# Patient Record
Sex: Female | Born: 1950
Health system: Southern US, Community
[De-identification: ages and names within clinical notes are randomized; demographics above are authoritative.]

## PROBLEM LIST (undated history)

## (undated) DIAGNOSIS — R519 Headache, unspecified: Secondary | ICD-10-CM

## (undated) DIAGNOSIS — M1990 Unspecified osteoarthritis, unspecified site: Secondary | ICD-10-CM

## (undated) DIAGNOSIS — C541 Malignant neoplasm of endometrium: Secondary | ICD-10-CM

## (undated) DIAGNOSIS — R7303 Prediabetes: Secondary | ICD-10-CM

## (undated) DIAGNOSIS — F32A Depression, unspecified: Secondary | ICD-10-CM

## (undated) DIAGNOSIS — K7581 Nonalcoholic steatohepatitis (NASH): Secondary | ICD-10-CM

## (undated) DIAGNOSIS — E669 Obesity, unspecified: Secondary | ICD-10-CM

## (undated) DIAGNOSIS — K219 Gastro-esophageal reflux disease without esophagitis: Secondary | ICD-10-CM

## (undated) DIAGNOSIS — R739 Hyperglycemia, unspecified: Secondary | ICD-10-CM

## (undated) DIAGNOSIS — F329 Major depressive disorder, single episode, unspecified: Secondary | ICD-10-CM

## (undated) DIAGNOSIS — I4891 Unspecified atrial fibrillation: Secondary | ICD-10-CM

## (undated) DIAGNOSIS — R32 Unspecified urinary incontinence: Secondary | ICD-10-CM

## (undated) DIAGNOSIS — D126 Benign neoplasm of colon, unspecified: Secondary | ICD-10-CM

## (undated) DIAGNOSIS — E785 Hyperlipidemia, unspecified: Secondary | ICD-10-CM

## (undated) DIAGNOSIS — K760 Fatty (change of) liver, not elsewhere classified: Secondary | ICD-10-CM

## (undated) DIAGNOSIS — I10 Essential (primary) hypertension: Secondary | ICD-10-CM

## (undated) HISTORY — DX: Obesity, unspecified: E66.9

## (undated) HISTORY — DX: Unspecified urinary incontinence: R32

## (undated) HISTORY — DX: Hyperlipidemia, unspecified: E78.5

## (undated) HISTORY — DX: Benign neoplasm of colon, unspecified: D12.6

## (undated) HISTORY — DX: Essential (primary) hypertension: I10

## (undated) HISTORY — PX: WISDOM TOOTH EXTRACTION: SHX21

## (undated) HISTORY — DX: Nonalcoholic steatohepatitis (NASH): K75.81

## (undated) HISTORY — DX: Fatty (change of) liver, not elsewhere classified: K76.0

## (undated) HISTORY — DX: Depression, unspecified: F32.A

## (undated) HISTORY — DX: Hyperglycemia, unspecified: R73.9

## (undated) HISTORY — PX: TUBAL LIGATION: SHX77

## (undated) HISTORY — DX: Gastro-esophageal reflux disease without esophagitis: K21.9

## (undated) HISTORY — DX: Malignant neoplasm of endometrium: C54.1

## (undated) HISTORY — DX: Unspecified osteoarthritis, unspecified site: M19.90

## (undated) HISTORY — DX: Major depressive disorder, single episode, unspecified: F32.9

## (undated) HISTORY — DX: Unspecified atrial fibrillation: I48.91

---

## 2001-05-15 ENCOUNTER — Encounter: Payer: Self-pay | Admitting: Orthopaedic Surgery

## 2001-05-15 ENCOUNTER — Ambulatory Visit (HOSPITAL_COMMUNITY): Admission: RE | Admit: 2001-05-15 | Discharge: 2001-05-15 | Payer: Self-pay | Admitting: Orthopaedic Surgery

## 2001-07-06 DIAGNOSIS — I739 Peripheral vascular disease, unspecified: Secondary | ICD-10-CM

## 2001-07-06 HISTORY — DX: Peripheral vascular disease, unspecified: I73.9

## 2001-11-14 ENCOUNTER — Ambulatory Visit (HOSPITAL_COMMUNITY): Admission: RE | Admit: 2001-11-14 | Discharge: 2001-11-14 | Payer: Self-pay | Admitting: Internal Medicine

## 2004-06-05 ENCOUNTER — Ambulatory Visit: Payer: Self-pay | Admitting: Internal Medicine

## 2004-07-06 DIAGNOSIS — D126 Benign neoplasm of colon, unspecified: Secondary | ICD-10-CM

## 2004-07-06 HISTORY — PX: ESOPHAGOGASTRODUODENOSCOPY: SHX1529

## 2004-07-06 HISTORY — DX: Benign neoplasm of colon, unspecified: D12.6

## 2005-03-19 ENCOUNTER — Ambulatory Visit: Payer: Self-pay | Admitting: Urgent Care

## 2005-04-09 ENCOUNTER — Ambulatory Visit: Payer: Self-pay | Admitting: Internal Medicine

## 2005-04-09 ENCOUNTER — Ambulatory Visit (HOSPITAL_COMMUNITY): Admission: RE | Admit: 2005-04-09 | Discharge: 2005-04-09 | Payer: Self-pay | Admitting: Internal Medicine

## 2005-04-09 ENCOUNTER — Encounter (INDEPENDENT_AMBULATORY_CARE_PROVIDER_SITE_OTHER): Payer: Self-pay | Admitting: Internal Medicine

## 2005-04-09 HISTORY — PX: COLONOSCOPY: SHX174

## 2005-11-03 ENCOUNTER — Ambulatory Visit (HOSPITAL_COMMUNITY): Payer: Self-pay | Admitting: Psychology

## 2005-11-23 ENCOUNTER — Ambulatory Visit (HOSPITAL_COMMUNITY): Payer: Self-pay | Admitting: Psychology

## 2005-12-28 ENCOUNTER — Ambulatory Visit (HOSPITAL_COMMUNITY): Payer: Self-pay | Admitting: Psychology

## 2006-01-10 ENCOUNTER — Ambulatory Visit: Admission: RE | Admit: 2006-01-10 | Discharge: 2006-01-10 | Payer: Self-pay | Admitting: Internal Medicine

## 2006-12-03 ENCOUNTER — Encounter
Admission: RE | Admit: 2006-12-03 | Discharge: 2006-12-03 | Payer: Self-pay | Admitting: Physical Medicine and Rehabilitation

## 2006-12-14 ENCOUNTER — Ambulatory Visit: Payer: Self-pay | Admitting: Internal Medicine

## 2007-03-17 ENCOUNTER — Ambulatory Visit (HOSPITAL_COMMUNITY): Admission: RE | Admit: 2007-03-17 | Discharge: 2007-03-17 | Payer: Self-pay | Admitting: Internal Medicine

## 2007-04-06 ENCOUNTER — Ambulatory Visit: Payer: Self-pay | Admitting: Gastroenterology

## 2007-07-20 ENCOUNTER — Ambulatory Visit (HOSPITAL_COMMUNITY): Admission: RE | Admit: 2007-07-20 | Discharge: 2007-07-20 | Payer: Self-pay | Admitting: Internal Medicine

## 2007-08-04 ENCOUNTER — Ambulatory Visit: Payer: Self-pay | Admitting: Gastroenterology

## 2008-02-28 DIAGNOSIS — Z86010 Personal history of colonic polyps: Secondary | ICD-10-CM | POA: Insufficient documentation

## 2008-02-28 DIAGNOSIS — K760 Fatty (change of) liver, not elsewhere classified: Secondary | ICD-10-CM | POA: Insufficient documentation

## 2008-02-28 DIAGNOSIS — R1110 Vomiting, unspecified: Secondary | ICD-10-CM | POA: Insufficient documentation

## 2008-02-28 DIAGNOSIS — M549 Dorsalgia, unspecified: Secondary | ICD-10-CM | POA: Insufficient documentation

## 2008-02-28 DIAGNOSIS — K219 Gastro-esophageal reflux disease without esophagitis: Secondary | ICD-10-CM | POA: Insufficient documentation

## 2008-02-28 DIAGNOSIS — K644 Residual hemorrhoidal skin tags: Secondary | ICD-10-CM | POA: Insufficient documentation

## 2008-02-28 DIAGNOSIS — Z87898 Personal history of other specified conditions: Secondary | ICD-10-CM | POA: Insufficient documentation

## 2008-02-28 DIAGNOSIS — R109 Unspecified abdominal pain: Secondary | ICD-10-CM | POA: Insufficient documentation

## 2008-02-28 DIAGNOSIS — R609 Edema, unspecified: Secondary | ICD-10-CM | POA: Insufficient documentation

## 2008-02-28 DIAGNOSIS — Z87891 Personal history of nicotine dependence: Secondary | ICD-10-CM | POA: Insufficient documentation

## 2008-02-28 DIAGNOSIS — R142 Eructation: Secondary | ICD-10-CM

## 2008-02-28 DIAGNOSIS — I10 Essential (primary) hypertension: Secondary | ICD-10-CM | POA: Insufficient documentation

## 2008-02-28 DIAGNOSIS — R143 Flatulence: Secondary | ICD-10-CM

## 2008-02-28 DIAGNOSIS — D131 Benign neoplasm of stomach: Secondary | ICD-10-CM | POA: Insufficient documentation

## 2008-02-28 DIAGNOSIS — R141 Gas pain: Secondary | ICD-10-CM | POA: Insufficient documentation

## 2008-02-28 DIAGNOSIS — F341 Dysthymic disorder: Secondary | ICD-10-CM | POA: Insufficient documentation

## 2008-02-28 DIAGNOSIS — E785 Hyperlipidemia, unspecified: Secondary | ICD-10-CM | POA: Insufficient documentation

## 2008-02-28 DIAGNOSIS — Z8719 Personal history of other diseases of the digestive system: Secondary | ICD-10-CM | POA: Insufficient documentation

## 2008-02-28 DIAGNOSIS — K589 Irritable bowel syndrome without diarrhea: Secondary | ICD-10-CM | POA: Insufficient documentation

## 2008-07-18 ENCOUNTER — Ambulatory Visit: Payer: Self-pay | Admitting: Gastroenterology

## 2008-12-28 ENCOUNTER — Encounter: Payer: Self-pay | Admitting: Gastroenterology

## 2009-08-23 ENCOUNTER — Encounter: Payer: Self-pay | Admitting: Urgent Care

## 2009-10-23 ENCOUNTER — Encounter: Payer: Self-pay | Admitting: Gastroenterology

## 2010-04-23 ENCOUNTER — Encounter (INDEPENDENT_AMBULATORY_CARE_PROVIDER_SITE_OTHER): Payer: Self-pay | Admitting: *Deleted

## 2010-07-15 ENCOUNTER — Encounter (INDEPENDENT_AMBULATORY_CARE_PROVIDER_SITE_OTHER): Payer: Self-pay | Admitting: *Deleted

## 2010-07-27 ENCOUNTER — Encounter: Payer: Self-pay | Admitting: Internal Medicine

## 2010-08-05 NOTE — Medication Information (Signed)
Summary: Visual merchandiser   Imported By: Zeb Comfort 10/23/2009 13:59:53  _____________________________________________________________________  External Attachment:    Type:   Image     Comment:   External Document  Appended Document: FP Folder Pt had Rx for Protonix once daily authorized APR 2011. No visit since 2010. Will need to be seen to change Rx: 30 min slot.  Appended Document: RX Folder Pt informed and Lennette Bihari @ [facility] informed.  Appended Document: RX Folder I spoke with the pt and she called [facility] and they had the old Rx on file..After looking further they were able to find the most current Rx changed to once daily with 11 refills..She doesn't need ov and was able to use her refills for Protonix.

## 2010-08-05 NOTE — Letter (Signed)
Summary: Recall, Screening Colonoscopy Only  [facility]  [address]   [place], [place] [address]   Phone: [phone]  Fax: [phone]    April 23, 2010  Patty Estrada [address] [place], [place]  [address] 06-Aug-1950   Dear Patty Estrada,   [place] records indicate it is time to schedule your colonoscopy.    Please call our office at [phone] and ask for the nurse.   Thank you,    Burnadette Peter, LPN Waldon Merl, [facility] Ph: [phone]   Fax: [phone]

## 2010-08-05 NOTE — Medication Information (Signed)
Summary: Visual merchandiser   Imported By: Zeb Comfort 08/23/2009 11:56:08  _____________________________________________________________________  External Attachment:    Type:   Image     Comment:   External Document  Appended Document: RX Folderpantoprazole    Prescriptions: PROTONIX 40 MG TBEC (PANTOPRAZOLE SODIUM) one by mouth daily  #31 x 11   Entered and Authorized by:   Andria Meuse FNP-BC   Signed by:   Andria Meuse FNP-BC on 08/26/2009   Method used:   Electronically to        [facility] (retail)       [address], [place]  [address]       Ph: [phone]       Fax: [phone]   RxID:   [id number]

## 2010-08-07 NOTE — Letter (Signed)
Summary: Recall Office Visit  [facility]  [address]   [place], [place] [address]   Phone: [phone]  Fax: [phone]      July 15, 2010   MONEE DEMBECK [address] [place], [place]  [address] [birth year]   Dear Ms. Hanneman,   According to our records, it is time for you to schedule a follow-up office visit with [place].   At your convenience, please call [phone] to schedule an office visit. If you have any questions, concerns, or feel that this letter is in error, we would appreciate your call.   Sincerely,    [facility] Ph: [phone]   Fax: [phone]

## 2010-09-29 ENCOUNTER — Encounter: Payer: Self-pay | Admitting: Gastroenterology

## 2010-09-30 ENCOUNTER — Encounter: Payer: Self-pay | Admitting: Gastroenterology

## 2010-09-30 ENCOUNTER — Ambulatory Visit (INDEPENDENT_AMBULATORY_CARE_PROVIDER_SITE_OTHER): Payer: Self-pay | Admitting: Gastroenterology

## 2010-09-30 VITALS — BP 120/68 | HR 80 | Temp 98.3°F | Ht 66.0 in | Wt 350.5 lb

## 2010-09-30 DIAGNOSIS — Z860100 Personal history of colon polyps, unspecified: Secondary | ICD-10-CM

## 2010-09-30 DIAGNOSIS — R1012 Left upper quadrant pain: Secondary | ICD-10-CM

## 2010-09-30 DIAGNOSIS — K219 Gastro-esophageal reflux disease without esophagitis: Secondary | ICD-10-CM

## 2010-09-30 DIAGNOSIS — K7689 Other specified diseases of liver: Secondary | ICD-10-CM

## 2010-09-30 DIAGNOSIS — K589 Irritable bowel syndrome without diarrhea: Secondary | ICD-10-CM

## 2010-09-30 MED ORDER — ALIGN 4 MG PO CAPS: 4.0000 mg | ORAL_CAPSULE | Freq: Every day | ORAL | Status: DC

## 2010-09-30 MED ORDER — PANTOPRAZOLE SODIUM 40 MG PO TBEC: 40.0000 mg | DELAYED_RELEASE_TABLET | Freq: Two times a day (BID) | ORAL | Status: DC

## 2010-09-30 NOTE — Assessment & Plan Note (Signed)
Back in 2008 she had an abdominal ultrasound which questioned possibility of cirrhosis given somewhat nodular contour the liver. She definitely had a fatty liver. Her LFTs are normal. She's had no indication of poor hepatic function. She may need further imaging for further evaluation of her liver. Will discuss with Dr. Gala Romney , and Dr. Oneida Alar absence.

## 2010-09-30 NOTE — Assessment & Plan Note (Signed)
Due for surveillance colonoscopy given history of adenomatous colonic polyps. Colonoscopy to be done in May 2012 per patient request. I have discussed the risks, alternatives, benefits with regards to but not limited to the risk of reaction to medication, bleeding, infection, perforation and the patient is agreeable to proceed.

## 2010-09-30 NOTE — Progress Notes (Signed)
Primary Care Physician:  Asencion Noble, MD  Chief Complaint  Patient presents with  . Medication Refill    protonix generic   . Pain    LUQ  . Colonoscopy    HPI:  Patty Estrada is a 60 y.o. female  Who is here for abdominal pain, GERD, to schedule colonoscopy. It has been 2 years since we last saw her. She was due for a surveillance colonoscopy in 2011 given history of adenomatous polyps. She made the appointment today primarily because of abdominal pain. Over the weekend she states her GI tract as did not feel right. She notes that her daughters have been in town and she was not eating as well as she had been. She's been eating out more frequently. She decided to take some Metamucil to see if that would help. She woke up early Sunday morning with left upper quadrant abdominal pain. Eventually she did have a bowel movement which she describes as normal. Yesterday she was still quite sore the left upper quadrant region. Today she's feeling better. She denies any fever or chills. No melena or rectal bleeding. She has noticed some increased reflux symptoms as well as belching. She's had increased nausea. She thinks this is because she is no longer taking her protonix twice a day. She notes that she stopped taking her protonix twice a day when she found her vitamin D level was low. Denies any dysphagia. No vomiting.   She brought in the lab work that she had done for Dr. Willey Blade back in February. She had normal LFTs , her creatinine was normal at 0.7.  Past Medical History  Diagnosis Date  . HTN (hypertension)   . Obesity   . GERD (gastroesophageal reflux disease)   . Hyperlipidemia   . Arthritis   . Adenomatous colon polyp 2006       . Depression   . Sleep apnea   . Fatty liver     ?cirrhosis on u/s in 2008    Past Surgical History  Procedure Date  . Tubal ligation   . Colonoscopy 04/09/2005    adenomatous polyps, two diverticula  . Esophagogastroduodenoscopy 2006    hyperplastic  polyps, hh    Current Outpatient Prescriptions  Medication Sig Dispense Refill  . acetaminophen (TYLENOL) 325 MG tablet Take 650 mg by mouth as needed.        Marland Kitchen atenolol (TENORMIN) 50 MG tablet Take 50 mg by mouth daily.        Marland Kitchen atorvastatin (LIPITOR) 40 MG tablet Take 40 mg by mouth daily.        . benazepril (LOTENSIN) 40 MG tablet Take 40 mg by mouth daily.        . CELEBREX 100 MG capsule Take 1 capsule by mouth daily as needed. May take up to two      . celecoxib (CELEBREX) 200 MG capsule Take 200 mg by mouth as needed.       . Cholecalciferol (VITAMIN D3) 1000 UNITS tablet Take 1,000 Units by mouth daily.        . ergocalciferol (VITAMIN D2) 50000 UNITS capsule Take 50,000 Units by mouth once a week.        . furosemide (LASIX) 40 MG tablet Take 40 mg by mouth 2 (two) times daily.        . furosemide (LASIX) 80 MG tablet Take 1 tablet by mouth daily.       Marland Kitchen ibuprofen (ADVIL) 200 MG tablet Take 200 mg by mouth as  needed.        . pantoprazole (PROTONIX) 40 MG tablet Take 1 tablet (40 mg total) by mouth 2 (two) times daily.  60 tablet  5  . potassium chloride SA (K-DUR,KLOR-CON) 20 MEQ tablet Take 1 tablet by mouth daily.      . sertraline (ZOLOFT) 50 MG tablet Take 50 mg by mouth daily.        Marland Kitchen DISCONTD: pantoprazole (PROTONIX) 40 MG tablet Take 40 mg by mouth daily.        . Cholecalciferol (VITAMIN D) 1000 UNITS capsule Take 1,000 Units by mouth every 14 (fourteen) days.       . Probiotic Product (ALIGN) 4 MG CAPS Take 4 mg by mouth daily.  30 capsule  0  . DISCONTD: Cream Base (PCCA LIPODERM BASE) CREA Apply topically. AS NEEDED       . DISCONTD: pregabalin (LYRICA) 75 MG capsule Take 75 mg by mouth 2 (two) times daily. AS NEEDED         Allergies as of 09/30/2010 - Review Complete 09/30/2010  Allergen Reaction Noted  . Gabapentin    . Nitrofurantoin      Family History  Problem Relation Age of Onset  . Breast cancer Mother 53  . Heart attack Father 71  . Colon cancer  Neg Hx   . Liver disease Neg Hx     History   Social History  . Marital Status: Married    Spouse Name: N/A    Number of Children: 2  . Years of Education: N/A   Occupational History  . Not on file.   Social History Main Topics  . Smoking status: Former Smoker -- 2.0 packs/day for 13 years    Types: Cigarettes    Quit date: 07/06/1984  . Smokeless tobacco: Never Used  . Alcohol Use: No     maybe one or two drinks a year  . Drug Use: No  . Sexually Active: No   Other Topics Concern  . 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    Review of Systems: Gen: Denies any fever, chills, sweats, anorexia, fatigue, weakness, malaise, weight loss, and sleep disorder CV: Denies chest pain, angina, palpitations, syncope, orthopnea, PND, peripheral edema, and claudication. Resp: Denies dyspnea at rest, dyspnea with exercise, cough, sputum, wheezing, coughing up blood, and pleurisy. GI: Denies vomiting blood, jaundice, and fecal incontinence.   Denies dysphagia or odynophagia. See HPI. GU : Denies urinary burning, blood in urine, urinary frequency, urinary hesitancy, nocturnal urination, and urinary incontinence. MS: Denies joint pain, limitation of movement, and swelling, stiffness, low back pain, extremity pain. Denies muscle weakness, cramps, atrophy.  Derm: Denies rash, itching, dry skin, hives, moles, warts, or unhealing ulcers.  Psych: Denies depression, anxiety, memory loss, suicidal ideation, hallucinations, paranoia, and confusion. Heme: Denies bruising, bleeding, and enlarged lymph nodes.  Physical Exam: BP 120/68  Pulse 80  Temp 98.3 F (36.8 C)  Ht 5' 6"  (1.676 m)  Wt 350 lb 8 oz (158.986 kg)  BMI 56.57 kg/m2 General:   Alert,  Well-developed, well-nourished, pleasant and cooperative in NAD Head:  Normocephalic and atraumatic. Eyes:  Sclera clear, no icterus.   Conjunctiva pink. Ears:  Normal auditory acuity. Nose:  No  deformity, discharge,  or lesions. Mouth:  No deformity or lesions, dentition normal. Neck:  Supple; no masses or thyromegaly. Lungs:  Clear throughout to auscultation.   No wheezes, crackles, or rhonchi. No acute distress.  Heart:  Regular rate and rhythm; no murmurs, clicks, rubs,  or gallops. Abdomen:  Soft, markedly obese. No masses, hepatosplenomegaly or hernias noted. Normal bowel sounds, without guarding, and without rebound.  Moderate tenderness in LUQ.  Rectal:  Deferred until time of colonoscopy.   Msk:  Symmetrical without gross deformities. Normal posture. Pulses:  Normal pulses noted. Extremities:  Without clubbing or edema. Neurologic:  Alert and  oriented x4;  grossly normal neurologically. Skin:  Intact without significant lesions or rashes. Cervical Nodes:  No significant cervical adenopathy. Psych:  Alert and cooperative. Normal mood and affect.

## 2010-09-30 NOTE — Assessment & Plan Note (Signed)
Chronic GERD with more difficult to control symptoms since going back to once daily protonix. Will allow her to take an additional dose in the evenings as needed however over the next one to 2 months she may take twice daily in order to get her symptoms under control. If her symptoms do not settle down then would consider an upper endoscopy at time of her colonoscopy.   Prescription provided. She will call with any further problems.

## 2010-09-30 NOTE — Assessment & Plan Note (Signed)
New onset left upper quadrant abdominal pain. Symptoms are settling down at this point. Possibly due to irritable bowel syndrome. Doubt diverticulitis , pancreatitis. She may have pulled a muscle based on description of activities over the weekend. She recalls straining as she was reaching for something. At this point would continue to monitor her. Would add probiotics for bowel dysfunction. If her symptoms persist, progress, or she develops fever she should let us know and at that time definitely would proceed with a CT of the abdomen and pelvis with contrast.

## 2010-09-30 NOTE — Progress Notes (Signed)
   Used to be on protonix bid up until 3 years ago. When vit D was low, then weaned off nighttime dose. Just used prn nighttime. Over the last month, increased nausea, epigastric discomfort. Stomach did feel right over the weekend. Started Metamucil. Woke up with severe LUQ pain. Then spread, had BM. Pain gone but still sore. Significant soreness yesterday, but better today. No fever, chills. BM usually once daily. No melena, brbpr. ?hemorrhoid due to rectal itching. No diarrhea. No appetite for couple of days. Increased belching. No dysphagia.

## 2010-11-13 ENCOUNTER — Telehealth: Payer: Self-pay | Admitting: Gastroenterology

## 2010-11-13 NOTE — Telephone Encounter (Signed)
Please find out how her GERD is. She is due TCS (patient wanted to postpone until 11/2010) and if her GERD still acting up would offer her EGD too.

## 2010-11-14 NOTE — Telephone Encounter (Signed)
Called, many rings and no answer.

## 2010-11-18 NOTE — Assessment & Plan Note (Signed)
NAMEMarland Kitchen  Patty Estrada, Patty Estrada             CHART#:  [id number]   DATE:  07/18/2008                       DOB:  Jan 25, 1951   REFERRING Itati Brocksmith:  Paula Compton. Willey Blade, MD   PROBLEM LIST:  1. Morbid obesity.  2. Hypertension.  3. Gastroesophageal reflux disease.  4. Hyperlipidemia.  5. Arthritis.  6. Colonoscopy in 2006 with hyperplastic colonoscopy and EGD in 2006      with hyperplastic gastric polyp and adenomatous polyps.  7. Occasional rectal bleeding seen in consultation.   SUBJECTIVE:  The patient is a 60 year old female who was seen as a  return patient visit.  She was last seen by me in October 2008.  She had  a complain of right upper quadrant pain.  Ultrasound had changes  suggestive of cirrhosis.  In October 2008, her liver enzymes were  completely normal and she had a GGT, which was 29.  She was seen and  evaluated at [facility] for obesity surgery and desired to  have a vertical sleeve.  Medicare would not pay for vertical sleeve and  she does not want band or gastric bypass.  In August 2009, she again had  a liver enzyme panel, which was absolutely normal.  She starts eating  right and now she is able to decrease her Protonix to once a day for  ideal management of her gastroesophageal reflux disease.  She has a good  bowel movement unless she eats junk food.  She loves sugar.   MEDICATIONS:  1. Protonix 40 mg daily.  2. Lasix.  3. Zoloft.  4. Lotensin.  5. Atenolol.  6. Lipitor.  7. Lyrica.  8. Celebrex.  9. Tylenol or Advil Arthritis as needed.  10.Potassium chloride.  11.Vitamin D.  12.Lidoderm patches.   OBJECTIVE:  VITAL SIGNS:  Weight reportedly 350 plus pounds, height 5  feet 6 inches, temperature 98.2, blood pressure 118/72, and pulse 80.  GENERAL:  She is in no apparent distress.  Alert and oriented x4.HEENT:  Atraumatic, normocephalic.  Pupils equal and reactive to light.  Mouth,  no oral lesions.  Posterior pharynx without erythema or exudate.NECK:  Full range of motion and no lymphadenopathy.LUNGS:  Clear to  auscultation bilaterally.CARDIOVASCULAR:  Regular rhythm.  No murmur.  Normal S1 and S2.ABDOMEN:  Bowel sounds are present, soft, nontender,  nondistended.  No rebound or guarding.  Obese.  EXTREMITIES:  Have no edema.   ASSESSMENT:  The patient is a 60 year old female who has nonalcoholic  fatty liver disease.  She also had an adenomatous polyp in 2006.  Her  gastroesophageal reflux disease is ideally controlled.  Thank you for  allowing me to see the patient in consultation.  My recommendations  follow.   RECOMMENDATIONS:  1. She is to continue her Protonix.  She is given the [facility]      handout on GERD recommendations.  2. Screening colonoscopy in 2011.  If no polyps or simple adenoma is      seen in 2011, then she may go every 10 years.  3. Follow up appointment in 24 months.  She is asked to continue to      have her liver enzymes sent to [place] periodically.  For her monitoring      of her the fatty liver disease, would check her enzymes once a  year.       Caro Hight, M.D.  Electronically Signed     SM/MEDQ  D:  07/18/2008  T:  07/19/2008  Job:  [id number]   cc:   Paula Compton. Willey Blade, MD

## 2010-11-18 NOTE — Assessment & Plan Note (Signed)
NAMEMarland Kitchen  Patty Estrada, Patty Estrada             CHART#:  [id number]   DATE:  08/04/2007                       DOB:  Feb 28, 1951   CHIEF COMPLAINT:  Follow up nonalcoholic fatty liver disease/epigastric  pain/right upper quadrant pain.   SUBJECTIVE:  Patty Estrada is a 60 year old morbidly obese female.  She was last seen by Dr. Stann Mainland on April 06, 2007.  She has a history of  chronic GERD.  She had been having epigastric and right upper quadrant  pain that was mostly postprandial.  She had some nausea and vomiting  with this pain.  She was scheduled for a HIDA scan, however, she  canceled this due to the fact that she felt she was unable to lie supine  for an extended period of time.  She has been on Protonix 40 mg b.i.d.  She has been doing much better.  She tells me she has lost a couple of  pounds intentionally.  She has had no further epigastric or right upper  quadrant pain.  She has not had any further vomiting or nausea episodes.  She is sending in paperwork to be seen at [facility] for consideration of gastric bypass.  She was found to  have fatty infiltration with possible hepatocellular disease on recent  ultrasound that was done on March 17, 2007.  There was no biliary  ductal dilatation and her gallbladder appeared normal.  She had LFTs  done July 14, 2007, which were normal.   CURRENT MEDICATIONS:  See the list from August 03, 2006.   ALLERGIES:  MACRODANTIN.   OBJECTIVE:  VITAL SIGNS:  Weight 350 pounds stated, height 66 inches,  temp 98.4, blood pressure 120/76, pulse 72.  GENERAL:  She is a morbidly obese Caucasian female who is alert,  oriented, pleasant, cooperative, in no acute distress.  HEENT:  Sclerae clear, nonicteric.  Conjunctivae pink.  OROPHARYNX:  Pink and moist without any lesions.  CHEST:  Heart regular in rate and rhythm, normal S1 S2 without any  murmurs, clicks, rubs or gallops.  ABDOMEN:  Protuberant with positive  bowel sounds x4.  No bruits  auscultated.  Soft, nontender without probable mass or  hepatosplenomegaly, although exam is extremely limited given the  patient's body habitus.  EXTREMITIES:  With trace lower extremity edema bilaterally.   ASSESSMENT:  1. Ms. Khamis is a 60 year old female with chronic      gastroesophageal reflux disease well controlled on twice daily      proton pump inhibitor.  She has tried to decrease to once daily,      however, has been unsuccessful with breakthrough symptoms.      Epigastric/right upper quadrant pain has resolved.  I suspect this      may have been related to gastritis, however cannot rule out biliary      dyskinesia.  She was unable to tolerate lying supine for HIDA scan.  2. History of adenomatous colonic polyps.  3. Nonalcoholic fatty liver disease/possible cirrhosis with normal      LFTs.   PLAN:  1. Colonoscopy 2011 by Dr. Stann Mainland.  2. I have offered dietary consult for low cholesterol diet and      suggested gradual weight loss.  3. She is wanting to wait until her appointment at [facility] for consideration of gastric bypass, as      she will have extensive nutritional counseling there.  4. She is going to continue Protonix 40 mg b.i.d.  5. She is going to call us if she has recurrent abdominal pain.       Vickey Huger, N.P.  Electronically Signed     Caro Hight, M.D.  Electronically Signed    KJ/MEDQ  D:  08/04/2007  T:  08/04/2007  Job:  [id number]   cc:   Paula Compton. Willey Blade, MD

## 2010-11-18 NOTE — Assessment & Plan Note (Signed)
NAMEMarland Estrada  LATORIA, DRY             CHART#:  [id number]   DATE:  12/14/2006                       DOB:  01/30/1951   CHIEF COMPLAINT:  Followup chronic GERD, proctalgia.   SUBJECTIVE:  The patient is a 60 year old Caucasian female with history  of chronic GERD.  She had a screening EGD on 04/09/05 by Dr. Laural Golden.  She was found to have a small sliding hiatal hernia, multiple small  gastric hyperplastic polyps.  She had a right colon adenomatous polyp on  her colonoscopy that was done at the same time and there were 2 tiny  diverticula at the sigmoid colon and external hemorrhoids.  She tells me  she is doing well on her Protonix 40 mg b.i.d., however, if she does  forget an evening dose she does have epigastric and left upper quadrant  pain and breakthrough heartburn and indigestion usually worse at night.  She says she has noticed a little difference in her symptoms since she  changed to generic Protonix.  This is the only PPI she has been on.  She  has no dysphagia, odynophagia, anorexia , early satiety or nausea,  vomiting.  She has noticed some intermittent bleeding with her bowel  movements.  She has been seen by Dr. Glo Herring and was felt to have a  possible rectocele.  She is considering gastric bypass at [facility].   PAST MEDICAL HISTORY:  Includes obesity, hypertension,  hypercholesterolemia, arthritis, chronic GERD, tubal ligation in 1988,  rectocele.   CURRENT MEDICATIONS:  See the list from 12/14/06.   ALLERGIES:   PHYSICAL EXAMINATION:  VITAL SIGNS:  Stated 365 pounds, height 66  inches, temperature 99.4, blood pressure 100/66, pulse 80.  GENERAL:  The patient  is a morbidly obese Caucasian female in no acute  distress.  She is alert, oriented, pleasant and cooperative in no acute  distress.  HEENT:  Sclerae clear, anicteric.  Conjunctivae pink.  Oropharynx pink  and moist without lesions.  CHEST:  Heart regular rate and rhythm,  normal S1, S2 without murmurs,  rubs, or gallops.  ABDOMEN:  Positive bowel sounds x4, no bruits auscultated, soft,  nontender, nondistended without palpable mass or hepatosplenomegaly.  No  rebound tenderness or guarding. Exam is limiting given patient's body  habitus.  EXTREMITIES:  With 1+ pitting lower extremity edema bilaterally.   ASSESSMENT:  The patient is a 60 year old morbidly obese Caucasian  female with history of chronic gastroesophageal reflux disease well  controlled on b.i.d. proton pump inhibitor.  She is considering gastric  bypass surgery at [facility].  She  also has a history of intermittent proctalgia and rectocele, being  followed by Dr. Glo Herring.   PLAN:  1. Protonix 40 mg b.i.d. # 60 with 11 refills.  2. Proctosol HC apply b.i.d. p.r.n. 1 tube, no refills.  3. Office visit in 2 years or sooner if needed.       Vickey Huger, N.P.  Electronically Signed     Hildred Laser, M.D.  Electronically Signed    KJ/MEDQ  D:  12/14/2006  T:  12/14/2006  Job:  [id number]   cc:   Paula Compton. Willey Blade, MD

## 2010-11-18 NOTE — Assessment & Plan Note (Signed)
NAMEMarland Kitchen  Patty, Estrada             CHART#:  [id number]   DATE:  04/06/2007                       DOB:  [birth year]   PROBLEM LIST:  1. Morbid obesity.  2. Hypertension.  3. Hyperlipidemia.  4. Arthritis.  5. Gastroesophageal reflux disease.  6. Occasional rectal bleeding seen with constipation.  7. Colonoscopy in 2006, with hyperplastic gastric polyps and      adenomatous polyp.   SUBJECTIVE:  Patty Estrada is a 60 year old female who presents as a  return patient visit.  She was complaining of intermittent right upper  quadrant and epigastric pain, and an abdominal ultrasound was obtained  in September of 2008, which showed diffuse hepatocellular disease,  suspicious for cirrhotic changes.  She had a normal gallbladder, no bile  duct dilation.  She has never been told that she has elevated liver  enzymes.  She is chronically maintained on an HMG CoA-Reductase  inhibitor and has had several liver enzymes drawn, and they have been  normal.  She denies any itching, turning yellow, blood transfusions, IV  drug abuse, alcohol use, tattoos or history of sexually transmitted  diseases.  She works as a Medical sales representative person and has not had any chemical  exposures.  She only sees rectal bleeding when she has to pass a hard  ball of stool, and then she says it cracks and bleeds, and it will be  sore for a while in her rectum.  She has had intentional weight loss  from 406 pounds to 360 pounds.  The pain in her right side occurs  usually after eating.  Her worst episode was this summer, when she had a  salad with regular [place] dressing.  She vomited bile.  The symptoms  lasted approximately 3 hours.  She has not had the sickness since  then, but continues to have an episode once a week at least.  It is a  dull ache.  It is not worse with movement, and it is not stabbing.  She  is trying to follow a low-fat diet, from information she got off the  Internet.   PAST MEDICAL HISTORY:  1.  Hypertension.  2. GERD.  3. Adenomatous polyps.  4. Back and feet pain.  5. Depression.   PAST SURGICAL HISTORY:  Tubal ligation.   ALLERGIES:  MACRODANTIN, GABAPENTIN CAUSES CONFUSION.   MEDICATIONS:  1. Protonix 40 mg b.i.d.  2. Lasix 40 mg daily.  3. HCTZ 25 mg daily.  4. Zoloft 50 mg daily.  5. Lotensin 40 mg daily.  6. Atenolol 50 mg daily.  7. Lipitor 40 mg daily.  8. Lyrica 75 mg b.i.d.  9. Celebrex 200 mg as needed.  10.Tylenol or Advil as needed.  11.Vitamin D, two daily.   FAMILY HISTORY:  She has no family history of liver disease, colon  cancer or colon polyps.   SOCIAL HISTORY:  She is married, has two kids.  She denies any tobacco  use.   OBJECTIVE:  VITAL SIGNS:  Weight 350 pound, height 5 foot 6 inches,  temperature 98.8, blood pressure 100/74, pulse 88.  GENERAL:  She is in no apparent distress, alert and oriented x4.  HEENT:  Atraumatic, normocephalic.  Pupils equal, reactive to light.  Mouth:  No oral lesions.  Posterior pharynx without erythema or exudate.  LUNGS:  Clear to auscultation bilaterally.  CARDIOVASCULAR:  Regular rhythm, no murmur.  Normal S1 and S2.  NECK:  Full range of motion, no lymphadenopathy.  ABDOMEN:  Bowel sounds are present, extremely obese, soft, nontender,  nondistended.  No rebound or guarding, unable to appreciate  hepatosplenomegaly, due to the size of her abdomen.  EXTREMITIES:  Without cyanosis, clubbing or edema.  NEURO:  She has no focal neurologic deficit.   ASSESSMENT:  Patty Estrada is a 60 year old female who has changes on  ultrasound, which suggest cirrhosis.  If she has cirrhosis, the most  likely etiology is cirrhosis secondary to non-alcoholic steatohepatitis.  She has low likelihood of other etiologies for cirrhosis, such as auto-  immune disease, hemachromatosis or primary biliary cirrhosis.  She has  intermittent right upper quadrant pain with a normal ultrasound, but the  differential diagnosis still  includes a calculus cholecystitis.  Her  symptoms seem consistent with biliary colic.  Thank you for allowing me  to see Patty Estrada in consultation.  My recommendations follow.   RECOMMENDATIONS:  1. She will need a screening colonoscopy in 2011.  2. Will schedule a HIDA scan to evaluate the function of her      gallbladder.  3. She is to follow a low-fat diet.  She is given a handout on a low-      fat diet.  4. Will obtain PT/INR, GGT, hepatic function panel and CBC on today.  5. After the lab results are known, and the HIDA scan has been      completed, then a follow-up appointment will be scheduled.       Caro Hight, M.D.  Electronically Signed     SM/MEDQ  D:  04/06/2007  T:  04/07/2007  Job:  [id number]   cc:   Paula Compton. Willey Blade, MD

## 2010-11-19 ENCOUNTER — Telehealth: Payer: Self-pay

## 2010-11-19 DIAGNOSIS — D369 Benign neoplasm, unspecified site: Secondary | ICD-10-CM

## 2010-11-19 NOTE — Telephone Encounter (Signed)
Patty Estrada, i called and triaged pt for colonoscopy. She also said that GERD is much better, but she still has a little nagging problem on occasions and thinks she might ought to have EGD also. Pt said she had discussed female physician, but since her husband is a pt of Dr. Roseanne Kaufman, she has decided to go with him if that is no problem. I have not made the appointment yet, pending your recommendation. I have updated the medication list. Please advise!  Gastroenterology Pre-Procedure Form  Request Date  11/19/2010     PATIENT INFORMATION:  Patty Estrada is a 60 y.o., female (DOB=14-May-1951).  PROCEDURE: Procedure(s) requested: colonoscopy Procedure Reason: previous adenomatous polyp  PATIENT REVIEW QUESTIONS: The patient reports the following:   1. Diabetes Melitis: no 2. Joint replacements in the past 12 months: no 3. Major health problems in the past 3 months: no 4. Has an artificial valve or MVP:no 5. Has been advised in past to take antibiotics in advance of a procedure like teeth cleaning: no}    MEDICATIONS & ALLERGIES:    Patient reports the following regarding taking any blood thinners:   Plavix? no Aspirin?no Coumadin?  no  Patient confirms/reports the following medications:  Current Outpatient Prescriptions  Medication Sig Dispense Refill  . acetaminophen (TYLENOL) 325 MG tablet Take 650 mg by mouth as needed.        Marland Kitchen atenolol (TENORMIN) 50 MG tablet Take 50 mg by mouth daily.        Marland Kitchen atorvastatin (LIPITOR) 40 MG tablet Take 40 mg by mouth daily.        . benazepril (LOTENSIN) 40 MG tablet Take 40 mg by mouth daily.        . CELEBREX 100 MG capsule Take 1 capsule by mouth daily as needed. May take up to two      . Cholecalciferol (VITAMIN D3) 1000 UNITS tablet Take 1,000 Units by mouth daily.        . ergocalciferol (VITAMIN D2) 50000 UNITS capsule Take 50,000 Units by mouth once a week. Once every other week.      . furosemide (LASIX) 80 MG tablet Take 1 tablet by mouth  daily.       . pantoprazole (PROTONIX) 40 MG tablet Take 1 tablet (40 mg total) by mouth 2 (two) times daily.  60 tablet  5  . potassium chloride SA (K-DUR,KLOR-CON) 20 MEQ tablet Take 1 tablet by mouth daily.      . Probiotic Product (ALIGN) 4 MG CAPS Take 4 mg by mouth daily.  30 capsule  0  . sertraline (ZOLOFT) 50 MG tablet Take 50 mg by mouth daily.        . celecoxib (CELEBREX) 200 MG capsule Take 200 mg by mouth as needed.       . Cholecalciferol (VITAMIN D) 1000 UNITS capsule Take 1,000 Units by mouth every 14 (fourteen) days.       . furosemide (LASIX) 40 MG tablet Take 40 mg by mouth 2 (two) times daily.        Marland Kitchen ibuprofen (ADVIL) 200 MG tablet Take 200 mg by mouth as needed. Rarely        Patient confirms/reports the following allergies:  Allergies  Allergen Reactions  . Gabapentin     REACTION: confusion  . Nitrofurantoin     REACTION: Hives    Patient is appropriate to schedule for requested procedure(s): yes  AUTHORIZATION INFORMATION Primary Insurance: ,  ID #: ,  Pre-Cert / Auth required:  Pre-Cert / Josem Kaufmann #  Secondary Insurance: ,  Pre-Cert / Auth required: Pre-Cert / Auth #  No orders of the defined types were placed in this encounter.    SCHEDULE INFORMATION: Procedure has been scheduled as follows:  Date 12/08/2010   Time: 11:00 AM Location: [facility] Short Stay   This Gastroenterology Pre-Precedure Form is being routed to the following provider(s) for review: R. Garfield Cornea, MD

## 2010-11-20 NOTE — Telephone Encounter (Signed)
Please schedule her for EGD/TCS (epig pain, gerd, adenomatous polyps). Schedule with RMR. No adjustments to medications.

## 2010-11-20 NOTE — Telephone Encounter (Signed)
Spoke with pt yesterday. She said she thought she might should proceed with EGD also. Info sent to Neil Crouch. PA.

## 2010-11-20 NOTE — Telephone Encounter (Signed)
Egd/tcs planned. See triage sheet.

## 2010-11-21 NOTE — Op Note (Signed)
NAME:  Patty Estrada, Patty Estrada NO.:  [id number]   MEDICAL RECORD NO.:  [id number]          PATIENT TYPE:  AMB   LOCATION:  DAY                           FACILITY:  [facility]   PHYSICIAN:  Hildred Laser, M.D.    DATE OF BIRTH:  17-Apr-1951   DATE OF PROCEDURE:  04/09/2005  DATE OF DISCHARGE:                                 OPERATIVE REPORT   PROCEDURE:  Esophagogastroduodenoscopy followed by colonoscopy with  polypectomy.   INDICATIONS:  Patty Estrada is a 60 year old Caucasian female who has chronic GERD  whose heartburn is well-controlled with anti-reflux measures and PPI, but  she has frequent burping. She does not have dysphagia. She also complains of  severe intermittent rectal pain. Recent gynecologic exam was unremarkable.  She also has history of colonic polyp. She had large polyp removed from  rectosigmoid junction in May 2003 and therefore undergoing surveillance  colonoscopy. Procedure risks were reviewed with the patient, and informed  consent was obtained.   PREMEDICATION:  Cetacaine spray pharyngeal topical anesthesia, Demerol 25 mg  IV, Versed 10 mg IV in divided dose.   FINDINGS:  Procedures performed in endoscopy suite. The patient's vital  signs and O2 saturation were monitored during the procedure and remained  stable. The patient was placed in left lateral position.   PROCEDURE #1:  ESOPHAGOGASTRODUODENOSCOPY. The patient was placed in left  lateral position and Olympus videoscope was passed via oropharynx without  any difficulty into esophagus.   Esophagus. Mucosa of the esophagus normal. GE junction was at 40 cm and  hiatus was 42. She had small sliding hiatal hernia.   Stomach. It was empty and distended very well insufflation. Folds of  proximal stomach were normal. Examination of mucosa revealed few small  erosions at gastric body and antrum. Two of these were biopsied for  histology. This had appearance of hyperplastic polyps. No ulcer crater was  seen.  Pyloric channel was patent. Angularis, fundus and cardia were normal.   Duodenum. Bulbar mucosa was normal. Scope was passed to the second part of  duodenum where mucosa and folds were normal. Endoscope was withdrawn and the  patient prepared for procedure #2.   PROCEDURE #2:  COLONOSCOPY. Rectal examination performed. No abnormality  noted on external or digital exam. Olympus videoscope was placed in rectum  and advanced under vision into sigmoid colon and beyond. Preparation was  satisfactory. Scope was passed to cecum which was then identified by  ileocecal valve. Blunt-end cecum was fairly well seen and was normal. Part  of it was covered with stool. There was a 5-mm polyp at ascending colon  which was snared and retrieved for histologic examination. Right next to his  there was a flat polyp with unusual appearance. It had nodularity and was  whitish in color. I was not sure whether this was an adenoma or hyperplastic  polyp. It was biopsied for histology, and residual lesion was coagulated  using snare tip. Mucosa of the rest of the colon was normal. Two tiny  diverticula were noted sigmoid colon. Rectal mucosa was normal. Scope was  retroflexed to examine anorectal junction which was well  seen. Small  hemorrhoids were noted below the dentate line, but there was no fissure  noted. Anal canal was also seen on the way out without any fissuring.  Endoscope was withdrawn. The patient tolerated the procedure well.   FINAL DIAGNOSIS:  1.  Small sliding hiatal hernia without evidence of Barrett's or endoscopic      esophagitis.  2.  Multiple small gastric polyps, two of which were biopsied for histology.      Endoscopically, these appeared to be hyperplastic.  3.  A 5-mm polyp snared from the ascending colon. Another flat lesion at      ascending colon was biopsied for histology and coagulated. This could be      a sessile polyp or a pseudopolyp.  4.  Two tiny diverticula at sigmoid  colon and external hemorrhoids.   RECOMMENDATIONS:  She will continue anti-reflux measures and Protonix as  before. No aspirin for 10 days. Levsin SL t.i.d. p.r.n. for rectal pain,  prescription given for 30 with one refill.   I will be contacting the patient with biopsy results and further  recommendations.      Hildred Laser, M.D.  Electronically Signed     NR/MEDQ  D:  04/09/2005  T:  04/09/2005  Job:  [id number]   cc:   Paula Compton. Willey Blade, MD  Fax: [phone]

## 2010-11-21 NOTE — H&P (Signed)
NAME:  Patty Estrada, Patty Estrada NO.:  [id number]   MEDICAL RECORD NO.:  [id number]         PATIENT TYPE:  AMB   LOCATION:                                FACILITY:  [facility]   PHYSICIAN:  Hildred Laser, M.D.    DATE OF BIRTH:  [birth year]   DATE OF ADMISSION:  DATE OF DISCHARGE:  LH                                HISTORY & PHYSICAL   CHIEF COMPLAINT:  Proctalgia, history of adenomatous polyp, chronic  gastroesophageal reflux disease.   HISTORY OF PRESENT ILLNESS:  Patty Estrada is a 60 year old Caucasian  female.  She was last seen in our office in December 2005 with proctalgia.  It was felt that she had a fissure.  She was given Anusol suppositories,  which did help some with her symptoms.  More recently, she has developed  severe rectal pain for the last 4 months.  She notes it began after sitting  on a hard seat.  She also complains of small volume bright red rectal  bleeding 3 weeks ago with an episode of hard stool and constipation.  She  notices blood on the toilet paper.  Since this time, she has had 10/10  proctalgia.  She has, however, had normal bowel movements since then.  She  notes difficulty with defecation.  She is having to apply pelvic floor  pressure vaginally in order to initiate a bowel movement.  She is taking  daily fiber.  She denies any abdominal pain.  Denies any nausea or vomiting.   She does have a history of colonoscopy on Nov 14, 2001.  She was found to  have a large polyp in the rectosigmoid region, which was a tubulovillous  adenoma.  She also had 2 small polyps in the transverse colon.  There were a  few small diverticula in the sigmoid and descending colon.   She has a history of chronic GERD, and is maintained on Protonix 40 mg  b.i.d.  She denies any breakthrough symptoms at this time.  Denies any  dysphagia or odynophagia.  She has never had a screening EGD.   PAST MEDICAL HISTORY:  1.  Adenomatous colon polyps with a coloscopy as  described in the HPI.  2.  Chronic GERD.  3.  Sleep apnea.  4.  Depression.  5.  Anxiety.  6.  Morbid obesity.  7.  Hypertension.  8.  Hypercholesterolemia.  9.  Tubal ligation in 1988.   ALLERGIES:  MACRODANTIN causes hives.   FAMILY HISTORY:  Mother deceased with breast carcinoma at age 24.  Father  deceased with MI at age 14.  One brother with coronary artery disease and  hyperlipidemia.  One sister who is fairly healthy.   SOCIAL HISTORY:  Patty Estrada has been married for 25 years.  She has 2  grown healthy children, one of whom resides at home.  She is unemployed.  She denies any drug use, tobacco use, and reports rare alcohol use.   REASON FOR ADMISSION:  CONSTITUTIONAL:  Weight is down about 30 pounds in  the last 9 months.  She denies any anorexia.  Denies any fatigue.  CARDIOVASCULAR:  Denies any chest pain, palpitations.  PULMONARY:  Denies  any dyspnea, cough, hemoptysis.  GI:  See HPI.  ENDOCRINE:  Denies any known  history of thyroid disease.  No known history of diabetes mellitus.   PHYSICAL EXAMINATION:  VITAL SIGNS:  Weight 374 pounds, height 62 inches,  temperature 98.2, blood pressure 152/68, pulse 82.  GENERAL:  Patty Estrada is a 60 year old morbidly obese Caucasian female  who is alert, oriented, pleasant, cooperative, in no acute distress.  HEENT:  Normocephalic and atraumatic.  Sclerae are anicteric.  Conjunctivae  are pink.  Oropharynx pink and moist without any lesions.  NECK:  Supple without thyromegaly.  CHEST:  Heart regular rate and rhythm.  Normal S1 and S2 without murmurs,  rubs, or gallops.  LUNGS:  Clear to auscultation bilaterally.  ABDOMEN:  Obese with positive bowel sounds x4.  No bruits auscultated.  Soft, nontender, nondistended without palpable mass or hepatosplenomegaly.  A few small subdermal freely mobile lipomas.  There is no rebound tenderness  or guarding.  Liver is palpable 2-3 finger breadths below the right costal   margin.  Unable to palpable splenomegaly; however, exam is extremely  limited, given patient's body habitus.  EXTREMITIES:  Erythematous scaly dry skin, venous stasis.  RECTAL:  Deferred.   IMPRESSION:  Patty Estrada is a 60 year old Caucasian female with severe  proctalgia and intermittent hematochezia.  I suspect that we are dealing  with a rectal fissure.  She also has a significant amount of difficulty with  defecation, and has to apply pelvic floor pressure digitally vaginally to  initial bowel movements.  She has a history of adenomatous colonic polyps,  and therefore she is due for exam to rule out colorectal carcinoma.   As far as her chronic gastroesophageal reflux disease is concerned, her  symptoms are well controlled on b.i.d. Protonix.  She has never had  screening EGD to rule out Barrett's esophagus, and this can be done at the  same time.   PLAN:  1.  Will schedule EGD and colonoscopy with Dr. Laural Golden in the near future.  I      discussed this procedure including risks and benefits including      bleeding, perforation, drug reaction __________ obtained.  2.  She can use a donut to sit on until further evaluation is performed.  3.  __________ forte 1 b.i.d., #20 kits with no refill.  4.  Further recommendations pending procedure.      Les Pou, N.P.      Hildred Laser, M.D.  Electronically Signed    KC/MEDQ  D:  03/19/2005  T:  03/19/2005  Job:  [id number]   cc:   Paula Compton. Willey Blade, MD  Fax: [phone]

## 2010-11-24 ENCOUNTER — Other Ambulatory Visit: Payer: Self-pay

## 2010-11-24 DIAGNOSIS — K219 Gastro-esophageal reflux disease without esophagitis: Secondary | ICD-10-CM

## 2010-11-24 DIAGNOSIS — R1013 Epigastric pain: Secondary | ICD-10-CM

## 2010-11-24 DIAGNOSIS — D369 Benign neoplasm, unspecified site: Secondary | ICD-10-CM

## 2010-11-27 NOTE — Telephone Encounter (Signed)
Addended by: Waldon Merl on: 11/27/2010 08:30 AM   Modules accepted: Orders

## 2010-12-05 HISTORY — PX: ESOPHAGOGASTRODUODENOSCOPY: SHX1529

## 2010-12-05 HISTORY — PX: COLONOSCOPY: SHX174

## 2010-12-08 ENCOUNTER — Encounter: Payer: Medicare Other | Admitting: Internal Medicine

## 2010-12-08 ENCOUNTER — Ambulatory Visit (HOSPITAL_COMMUNITY)
Admission: RE | Admit: 2010-12-08 | Discharge: 2010-12-08 | Disposition: A | Discharge status: Home / Self Care | Payer: Medicare Other | Source: Ambulatory Visit | Attending: Internal Medicine | Admitting: Internal Medicine

## 2010-12-08 ENCOUNTER — Other Ambulatory Visit: Payer: Self-pay | Admitting: Internal Medicine

## 2010-12-08 DIAGNOSIS — Z860100 Personal history of colon polyps, unspecified: Secondary | ICD-10-CM | POA: Insufficient documentation

## 2010-12-08 DIAGNOSIS — E785 Hyperlipidemia, unspecified: Secondary | ICD-10-CM | POA: Insufficient documentation

## 2010-12-08 DIAGNOSIS — R1012 Left upper quadrant pain: Secondary | ICD-10-CM

## 2010-12-08 DIAGNOSIS — K449 Diaphragmatic hernia without obstruction or gangrene: Secondary | ICD-10-CM

## 2010-12-08 DIAGNOSIS — D126 Benign neoplasm of colon, unspecified: Secondary | ICD-10-CM | POA: Insufficient documentation

## 2010-12-08 DIAGNOSIS — D131 Benign neoplasm of stomach: Secondary | ICD-10-CM

## 2010-12-08 DIAGNOSIS — G4733 Obstructive sleep apnea (adult) (pediatric): Secondary | ICD-10-CM | POA: Insufficient documentation

## 2010-12-08 DIAGNOSIS — I10 Essential (primary) hypertension: Secondary | ICD-10-CM | POA: Insufficient documentation

## 2010-12-08 DIAGNOSIS — K5730 Diverticulosis of large intestine without perforation or abscess without bleeding: Secondary | ICD-10-CM

## 2010-12-08 DIAGNOSIS — Z79899 Other long term (current) drug therapy: Secondary | ICD-10-CM | POA: Insufficient documentation

## 2010-12-11 ENCOUNTER — Encounter: Payer: Self-pay | Admitting: Internal Medicine

## 2010-12-15 ENCOUNTER — Encounter: Payer: Self-pay | Admitting: Internal Medicine

## 2010-12-19 ENCOUNTER — Encounter: Payer: Self-pay | Admitting: Internal Medicine

## 2011-01-16 ENCOUNTER — Telehealth: Payer: Self-pay | Admitting: Gastroenterology

## 2011-01-16 NOTE — Telephone Encounter (Signed)
Patient has appt in 03/2011 for f/u. She had fatty liver vs early cirrhosis on abd u/s 2008 ordered by PCP. Recent EGD, no evidence of cirrhosis (ie portal hypertension). Will discuss with her at upcoming appt. May need CT to better look at liver.

## 2011-01-19 NOTE — Op Note (Signed)
NAME:  Patty Estrada, Patty Estrada NO.:  [id number]  MEDICAL RECORD NO.:  [id number]  LOCATION:  DAYP                          FACILITY:  [facility]  PHYSICIAN:  R. Garfield Cornea, MD [place]:  [birth year]  DATE OF PROCEDURE:  12/08/2010 DATE OF DISCHARGE:                              OPERATIVE REPORT   INDICATIONS FOR PROCEDURE:  A 60 year old lady with a history of colonic adenomas due for surveillance at this time, intermittent left upper quadrant abdominal pain and recently back on Protonix 40 mg twice daily with associated significant improvement in her reflux symptoms, history of vitamin D deficiency.  Still has some left upper quadrant abdominal pain, but this has significantly improved.  She continues to take nonsteroidal agents.  EGD and colonoscopy is now being done.  Risks, benefits, limitations, alternatives and imponderables have been discussed, questions answered.  Please see the documentation in the medical record.  PROCEDURE NOTE:  O2 saturation, blood pressure, pulse and respirations were monitored throughout the entirety of the procedure.  CONSCIOUS SEDATION: 1. Versed 8 mg IV. 2. Demerol 150 mg IV in divided doses. 3. Cetacaine spray for topical pharyngeal anesthesia.  INSTRUMENT:  Pentax video chip system.  FINDINGS:  EGD examination of the tubular esophagus revealed no mucosal abnormalities.  EGD junction easily traversed.  Stomach:  Gastric cavity was emptied and insufflated well with air.  Thorough examination of the gastric mucosa including retroflexed view of the proximal stomach, esophagogastric junction demonstrated multiple fundal gland-type polyps (previously biopsied and shown to be hyperplastic).  Couplings were good size 1-cm.  Please see photos.  Otherwise, gastric mucosa appeared normal.  No evidence of gastric erosions, infiltrating process or ulceration.  Pylorus was patent, easily traversed.  Examination of the bulb and  second portion revealed no abnormalities.  THERAPEUTIC/DIAGNOSTIC MANEUVERS PERFORMED:  Two of the large polyps were hot snared, one was lost, the other was recovered from the pathologist.  The patient tolerated the procedure well and was prepared for colonoscopy.  Digital rectal exam revealed no abnormalities. Endoscopic findings:  Prep was adequate.  Colon:  Colonic mucosa was surveyed from the rectosigmoid junction through the left transverse right colon to the appendiceal orifice, ileocecal valve/cecum.  These structures were well seen and photographed for the record.  From this level, scope was slowly and cautiously withdrawn.  All previously mentioned mucosal surfaces were again seen.  The patient had 3 diminutive polyps in the ascending segment which were cold snare removed.  The patient had a left-sided diverticula.  The patient had a redundant colon requiring external abdominal pressure, changing in the patient's position to reach the cecum.  Cecum, appendiceal orifice, ileocecal valve were well seen and photographed for the record.  From this level, scope was slowly and cautiously withdrawn.  All previously mentioned mucosal surfaces were again seen.  No other abnormalities were observed.  The patient's scope was pulled down into the rectum, where a thorough examination of the rectal mucosa including retroflexed view of the anal verge demonstrated no abnormalities.  The patient tolerated this procedure well.  Cecal withdrawal time 6 minutes.  IMPRESSION:  EGD normal esophagus.  Multiple gastric polyps, status post resection of 2.  Small hiatal  hernia otherwise normal stomach, patent pylorus, normal D1 and D2 with colonoscopy findings, normal rectum, left- sided diverticula, ascending colon polyp, status post snare polypectomy.  RECOMMENDATIONS: 1. No aspirin or arthritis medications for 7 days. 2. Followup on path. 3. Further recommendations to follow. 4. Continue Protonix  40 mg twice daily. 5. The patient has extensive NSAID use history.  If her left upper     quadrant abdominal pain does not continue to subside, we will     recommend she is going to a drug holiday off of nonsteroidals and     see what effect this has before considering further evaluation,     i.e., CT, etc.  Again, her left upper quadrant pain has     significantly improved.     Bridgette Habermann, MD FACP [facility]     RMR/MEDQ  D:  12/08/2010  T:  12/09/2010  Job:  [id number]  cc:   Paula Compton. Willey Blade, MD Fax: [phone]  Electronically Signed by Jannette Spanner M.D. on 01/19/2011 08:37:29 AM

## 2011-03-11 ENCOUNTER — Ambulatory Visit (INDEPENDENT_AMBULATORY_CARE_PROVIDER_SITE_OTHER): Payer: Medicare Other | Admitting: Gastroenterology

## 2011-03-11 ENCOUNTER — Encounter: Payer: Self-pay | Admitting: Gastroenterology

## 2011-03-11 VITALS — BP 131/67 | HR 87 | Temp 98.0°F | Ht 66.0 in | Wt 349.8 lb

## 2011-03-11 DIAGNOSIS — R1012 Left upper quadrant pain: Secondary | ICD-10-CM

## 2011-03-11 DIAGNOSIS — K219 Gastro-esophageal reflux disease without esophagitis: Secondary | ICD-10-CM

## 2011-03-11 DIAGNOSIS — K7460 Unspecified cirrhosis of liver: Secondary | ICD-10-CM | POA: Insufficient documentation

## 2011-03-11 DIAGNOSIS — Z86010 Personal history of colonic polyps: Secondary | ICD-10-CM

## 2011-03-11 DIAGNOSIS — K7689 Other specified diseases of liver: Secondary | ICD-10-CM

## 2011-03-11 DIAGNOSIS — K589 Irritable bowel syndrome without diarrhea: Secondary | ICD-10-CM

## 2011-03-11 MED ORDER — PANTOPRAZOLE SODIUM 40 MG PO TBEC: 40.0000 mg | DELAYED_RELEASE_TABLET | Freq: Two times a day (BID) | ORAL | Status: DC

## 2011-03-11 NOTE — Progress Notes (Signed)
Primary Care Physician: Asencion Noble, MD  Primary Gastroenterologist:  Garfield Cornea, MD   Chief Complaint  Patient presents with  . Follow-up    doing better    HPI: Patty Estrada is a 60 y.o. female here for f/u. Since last OV, EGD/TCS done. She had multiple gastric polyps which were benign. She has hh, left-sided diverticula, adenomatous colon polyps. She states she feels much better. Abdominal pain resolved. Bowels are doing great. No melena, brbpr. ?hemorrhoid itching? Probiotics really helping her. Gerd well-controlled on protonix bid.    Current Outpatient Prescriptions  Medication Sig Dispense Refill  . acetaminophen (TYLENOL) 325 MG tablet Take 650 mg by mouth as needed.        Marland Kitchen atenolol (TENORMIN) 50 MG tablet Take 50 mg by mouth daily.        Marland Kitchen atorvastatin (LIPITOR) 40 MG tablet Take 40 mg by mouth daily.        . benazepril (LOTENSIN) 40 MG tablet Take 40 mg by mouth daily.        . CELEBREX 100 MG capsule Take 1 capsule by mouth daily as needed. May take up to two      . Cholecalciferol (VITAMIN D) 1000 UNITS capsule Take 1,000 Units by mouth every 14 (fourteen) days.       . Cholecalciferol (VITAMIN D3) 1000 UNITS tablet Take 1,000 Units by mouth daily.        . ergocalciferol (VITAMIN D2) 50000 UNITS capsule Take 50,000 Units by mouth once a week. Once every other week.      . furosemide (LASIX) 40 MG tablet Take 40 mg by mouth 2 (two) times daily.        . furosemide (LASIX) 80 MG tablet Take 1 tablet by mouth daily.       Marland Kitchen ibuprofen (ADVIL) 200 MG tablet Take 200 mg by mouth as needed. Rarely      . pantoprazole (PROTONIX) 40 MG tablet Take 1 tablet (40 mg total) by mouth 2 (two) times daily.  60 tablet  5  . potassium chloride SA (K-DUR,KLOR-CON) 20 MEQ tablet Take 1 tablet by mouth daily.      . Probiotic Product (ALIGN) 4 MG CAPS Take 4 mg by mouth daily.  30 capsule  0  . sertraline (ZOLOFT) 50 MG tablet Take 50 mg by mouth daily.          Allergies as of  03/11/2011 - Review Complete 03/11/2011  Allergen Reaction Noted  . Gabapentin    . Nitrofurantoin      ROS:  General: Negative for anorexia, weight loss, fever, chills, fatigue, weakness. ENT: Negative for hoarseness, difficulty swallowing , nasal congestion. CV: Negative for chest pain, angina, palpitations, dyspnea on exertion, peripheral edema.  Respiratory: Negative for dyspnea at rest, dyspnea on exertion, cough, sputum, wheezing.  GI: See history of present illness. GU:  Negative for dysuria, hematuria, urinary incontinence, urinary frequency, nocturnal urination.  Endo: Negative for unusual weight change.    Physical Examination:   BP 131/67  Pulse 87  Temp(Src) 98 F (36.7 C) (Temporal)  Ht 5' 6"  (1.676 m)  Wt 349 lb 12.8 oz (158.668 kg)  BMI 56.46 kg/m2  General: Well-nourished, well-developed in no acute distress. Obese. Eyes: No icterus. Mouth: Oropharyngeal mucosa moist and pink , no lesions erythema or exudate. Extremities: No lower extremity edema. No clubbing or deformities. Neuro: Alert and oriented x 4   Skin: Warm and dry, no jaundice.   Psych: Alert and cooperative, normal mood  and affect.

## 2011-03-12 ENCOUNTER — Ambulatory Visit: Payer: Medicare Other | Admitting: Gastroenterology

## 2011-03-13 ENCOUNTER — Encounter: Payer: Self-pay | Admitting: Gastroenterology

## 2011-03-13 NOTE — Assessment & Plan Note (Addendum)
Fatty liver disease with normal lfts. Last imaging was u/s in 2008, ?nodular liver. Further evaluate at this time. If needs more detailed study at some point in the future, patient states she cannot lay flat for MRI. OV in six months with Dr. Gala Romney.

## 2011-03-13 NOTE — Assessment & Plan Note (Signed)
Continue pantoprazole bid for now. She can try to cut back to once daily in the next 4-6 weeks. If recurrent GERD or abd pain, she will resume bid.

## 2011-03-13 NOTE — Assessment & Plan Note (Signed)
Doing well 

## 2011-03-13 NOTE — Assessment & Plan Note (Signed)
Next colonoscopy due 12/2015.

## 2011-03-16 NOTE — Progress Notes (Signed)
Cc to PCP 

## 2011-03-17 ENCOUNTER — Ambulatory Visit (HOSPITAL_COMMUNITY): Payer: Medicare Other

## 2011-04-06 ENCOUNTER — Other Ambulatory Visit: Payer: Self-pay

## 2011-04-06 DIAGNOSIS — K219 Gastro-esophageal reflux disease without esophagitis: Secondary | ICD-10-CM

## 2011-04-06 MED ORDER — PANTOPRAZOLE SODIUM 40 MG PO TBEC: 40.0000 mg | DELAYED_RELEASE_TABLET | Freq: Two times a day (BID) | ORAL | Status: DC

## 2011-04-20 ENCOUNTER — Other Ambulatory Visit (HOSPITAL_COMMUNITY): Payer: Medicare Other

## 2011-04-20 ENCOUNTER — Ambulatory Visit (HOSPITAL_COMMUNITY)
Admission: RE | Admit: 2011-04-20 | Discharge: 2011-04-20 | Disposition: A | Discharge status: Home / Self Care | Payer: Medicare Other | Source: Ambulatory Visit | Attending: Gastroenterology | Admitting: Gastroenterology

## 2011-04-20 DIAGNOSIS — R1012 Left upper quadrant pain: Secondary | ICD-10-CM

## 2011-04-20 DIAGNOSIS — K7689 Other specified diseases of liver: Secondary | ICD-10-CM | POA: Insufficient documentation

## 2011-04-20 DIAGNOSIS — R935 Abnormal findings on diagnostic imaging of other abdominal regions, including retroperitoneum: Secondary | ICD-10-CM | POA: Insufficient documentation

## 2011-04-20 LAB — COMPREHENSIVE METABOLIC PANEL
Albumin: 4.4
Creat: 0.66
Hgb A1c MFr Bld: 6.1 % — AB (ref 4.0–6.0)
Total Bilirubin: 0.5 mg/dL

## 2011-04-27 NOTE — Progress Notes (Signed)
Quick Note:  As discussed at [place]. Looks like there is some "scarring" in liver. Probably related to fatty liver.  Recommend OV with RMR only to discuss. Patient has not seen RMR is a long while and would request appt with him only to address liver findings. ______

## 2011-04-28 NOTE — Progress Notes (Signed)
Quick Note:  Pt aware, pt stated she knew she had scarring and a fatty liver and wants to know if this is something new that she doesn't know about and she was to come see RMR in 6 months anyway, does really need to come sooner? ______

## 2011-04-29 NOTE — Progress Notes (Signed)
Quick Note:  Recommend OV with RMR in 08/2011. Please go ahead and make appt now if possible.  Find out if patient has had LFTs done in last six months, if not let's go ahead and do those now. ______

## 2011-04-30 NOTE — Progress Notes (Signed)
Quick Note:  Tried to call pt- NA  ______

## 2011-05-05 NOTE — Progress Notes (Signed)
Quick Note:  LFTs normal, done at PCP. Keep appt with RMR in 08/2011. Patient wanted to postpone until then. Please NIC that pt needs baseline AFP at that visit. ______

## 2011-07-07 DIAGNOSIS — I499 Cardiac arrhythmia, unspecified: Secondary | ICD-10-CM

## 2011-07-07 DIAGNOSIS — I4891 Unspecified atrial fibrillation: Secondary | ICD-10-CM

## 2011-07-07 HISTORY — DX: Unspecified atrial fibrillation: I48.91

## 2011-07-07 HISTORY — DX: Cardiac arrhythmia, unspecified: I49.9

## 2011-07-21 ENCOUNTER — Telehealth: Payer: Self-pay | Admitting: Gastroenterology

## 2011-07-21 MED ORDER — HYDROCORTISONE 2.5 % RE CREA: TOPICAL_CREAM | Freq: Two times a day (BID) | RECTAL | Status: DC

## 2011-07-21 NOTE — Telephone Encounter (Signed)
Please let her know I called in hydrocortisone cream. Did not see what we had her on before. If she wants something different let me know.

## 2011-07-21 NOTE — Telephone Encounter (Signed)
Pt stated she has been using otc cream and was told if it didn't work that we would send an rx. She has gotten the anusol and will call us if she has any problems.   Pt stated she is supposed to come back for follow up in February.  Manuela Schwartz, will you please make sure she is on recall list. Thanks.

## 2011-07-21 NOTE — Telephone Encounter (Signed)
Pt needs a refill on her hemorrhoid cream called in to [facility]

## 2011-08-04 ENCOUNTER — Encounter: Payer: Self-pay | Admitting: Internal Medicine

## 2011-08-04 ENCOUNTER — Other Ambulatory Visit: Payer: Self-pay | Admitting: Gastroenterology

## 2011-08-04 ENCOUNTER — Telehealth: Payer: Self-pay | Admitting: Internal Medicine

## 2011-08-04 DIAGNOSIS — K7689 Other specified diseases of liver: Secondary | ICD-10-CM

## 2011-08-04 NOTE — Telephone Encounter (Signed)
Lab order mailed to pt.

## 2011-08-04 NOTE — Telephone Encounter (Signed)
Pt has OV with RMR on 2/12 at 1130 and needs AFP prior to OV

## 2011-08-04 NOTE — Telephone Encounter (Signed)
Pt is aware of OV on 2/12 at 1130 with RMR and appt card was mailed. AFP should be done prior to OV

## 2011-08-14 ENCOUNTER — Other Ambulatory Visit: Payer: Self-pay | Admitting: Gastroenterology

## 2011-08-15 LAB — AFP TUMOR MARKER: AFP-Tumor Marker: 3.3 ng/mL (ref 0.0–8.0)

## 2011-08-17 NOTE — Progress Notes (Signed)
Quick Note:  Patient has OV with RMR 08/17/10. Needs to keep appt. AFP normal. ______

## 2011-08-18 ENCOUNTER — Encounter: Payer: Self-pay | Admitting: Internal Medicine

## 2011-08-18 ENCOUNTER — Ambulatory Visit (INDEPENDENT_AMBULATORY_CARE_PROVIDER_SITE_OTHER): Payer: Medicare Other | Admitting: Internal Medicine

## 2011-08-18 ENCOUNTER — Ambulatory Visit: Payer: Medicare Other | Admitting: Internal Medicine

## 2011-08-18 DIAGNOSIS — K760 Fatty (change of) liver, not elsewhere classified: Secondary | ICD-10-CM

## 2011-08-18 DIAGNOSIS — K7689 Other specified diseases of liver: Secondary | ICD-10-CM

## 2011-08-18 DIAGNOSIS — K219 Gastro-esophageal reflux disease without esophagitis: Secondary | ICD-10-CM

## 2011-08-18 DIAGNOSIS — R109 Unspecified abdominal pain: Secondary | ICD-10-CM

## 2011-08-18 NOTE — Assessment & Plan Note (Addendum)
Very pleasant morbidly obese lady with a coarse appearing liver: CT. She most likely has NAFLD with secondary cirrhosis. She has very good preserved hepatic synthetic function, however. She is at risk for going on developed all the complications of cirrhosis from whatever the cause. Reviewed the utility of exercise and weight loss in this setting. She has been afraid to pursue bariatric surgery. I emphasized that many of her problems are weight related and have encouraged her to think about the possibility of bariatric surgery in the future. Left upper quadrant abdominal pain without explanation on recent CT. May be as simple as a relative constipation. Colonoscopy findings last year reassuring as were CT findings.  Recommendations: Exercise program - might consider pool exercises at the [facility]; total calorie restriction; gradual weight loss.  Continue Protonix for reflux. Would avoid nonsteroidal agents as they may be deleterious to the liver.  Alpha-fetoprotein and ultrasound of the liver in 6 months  Hepatic profile CBC be met, INR today along with hepatitis B surface antibody; hepatitis A total antibody as well  Colace 100 mg orally twice daily. MiraLax 17 g orally at bedtime as needed if noted reasonably good stool and any given day.   Office visit in one year.  Consider screening EGD in 2 years (for  Varices).  Surveillance colonoscopy 2017.

## 2011-08-18 NOTE — Patient Instructions (Signed)
weigth loss - pool exercise  Cbc,lfts, inr bmet  Hepatitis B surface antigen, hepatitis A igg with Dr; Beverlee Nims labs on Feb 20  OV here1 year    Colace 14m twice daily  miralax 17 grams at bedtime as needed for constipation

## 2011-08-18 NOTE — Progress Notes (Signed)
Primary Care Physician:  Asencion Noble, MD, MD Primary Gastroenterologist:  Dr. Gala Romney  Pre-Procedure History & Physical: HPI:  Patty Estrada is a 61 y.o. female here for followup. Patient has a coarse appearing liver: CT scan consistent with early cirrhosis. EGD last demonstrated fundal gland polyp. No stigmata of advanced chronic liver disease i.e. no varices or portal gastropathy. She had an adenomatous polyp. Due for surveillance 2017. She continues to be morbidly obese. Afraid to pursue bariatric surgery. Reflux symptoms under fairly good control with PPI. She does use Celebrex and other nonsteroidals on occasion for various aches and pains. LFTs previously normal. Immune status for hepatitis A and B. needs to be sorted out further. Recent alpha-fetoprotein 3.3.  Mild left upper quadrant bowel pain has reappeared. She is constipated. She reminded me she has a rectocele. She notes when she has a bowel movement the left upper quadrant abdominal pain tends to diminish. She is not on a stool softener or laxative at this time. Past Medical History  Diagnosis Date  . HTN (hypertension)   . Obesity   . GERD (gastroesophageal reflux disease)   . Hyperlipidemia   . Arthritis   . Adenomatous colon polyp 2006       . Depression   . Sleep apnea   . Fatty liver     ?cirrhosis on u/s in 2008  . Vitamin d deficiency     Past Surgical History  Procedure Date  . Tubal ligation   . Colonoscopy 04/09/2005    adenomatous polyps, two diverticula  . Esophagogastroduodenoscopy 2006    hyperplastic polyps, hh  . Esophagogastroduodenoscopy 12/2010    hh, multiple benign gastric polyps,   . Colonoscopy 12/2010    left-sided diverticula, adenomatous polyps, next TCS due 12/2015    Prior to Admission medications   Medication Sig Start Date End Date Taking? Authorizing Provider  acetaminophen (TYLENOL) 325 MG tablet Take 650 mg by mouth as needed.     Yes Historical Provider, MD  atenolol (TENORMIN) 50 MG  tablet Take 50 mg by mouth daily.     Yes Historical Provider, MD  atorvastatin (LIPITOR) 40 MG tablet Take 40 mg by mouth daily.     Yes Historical Provider, MD  benazepril (LOTENSIN) 40 MG tablet Take 40 mg by mouth daily.     Yes Historical Provider, MD  CELEBREX 100 MG capsule Take 1 capsule by mouth daily as needed. May take up to two 09/12/10  Yes Historical Provider, MD  Cholecalciferol (VITAMIN D3) 1000 UNITS tablet Take 1,000 Units by mouth daily.     Yes Historical Provider, MD  ergocalciferol (VITAMIN D2) 50000 UNITS capsule Take 50,000 Units by mouth once a week. Once every other week.   Yes Historical Provider, MD  furosemide (LASIX) 40 MG tablet Take 40 mg by mouth daily.    Yes Historical Provider, MD  ibuprofen (ADVIL) 200 MG tablet Take 200 mg by mouth as needed. Rarely   Yes Historical Provider, MD  pantoprazole (PROTONIX) 40 MG tablet Take 1 tablet (40 mg total) by mouth 2 (two) times daily. 04/06/11  Yes Laban Emperor, NP  potassium chloride SA (K-DUR,KLOR-CON) 20 MEQ tablet Take 1 tablet by mouth daily. 09/25/10  Yes Historical Provider, MD  Probiotic Product (ALIGN) 4 MG CAPS Take 4 mg by mouth daily. 09/30/10  Yes Neil Crouch, PA  sertraline (ZOLOFT) 50 MG tablet Take 50 mg by mouth daily.     Yes Historical Provider, MD  Cholecalciferol (VITAMIN D) 1000 UNITS capsule  Take 1,000 Units by mouth every 14 (fourteen) days.     Historical Provider, MD  furosemide (LASIX) 80 MG tablet Take 1 tablet by mouth daily.  09/17/10   Historical Provider, MD    Allergies as of 08/18/2011 - Review Complete 08/18/2011  Allergen Reaction Noted  . Gabapentin    . Nitrofurantoin      Family History  Problem Relation Age of Onset  . Breast cancer Mother 52  . Heart attack Father 55  . Colon cancer Neg Hx   . Liver disease Neg Hx     History   Social History  . Marital Status: Married    Spouse Name: N/A    Number of Children: 2  . Years of Education: N/A   Occupational History  . Not  on file.   Social History Main Topics  . Smoking status: Former Smoker -- 2.0 packs/day for 13 years    Types: Cigarettes    Quit date: 07/06/1984  . Smokeless tobacco: Never Used  . Alcohol Use: No     maybe one or two drinks a year  . Drug Use: No  . Sexually Active: No   Other Topics Concern  . 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    Review of Systems: See HPI, otherwise negative ROS  Physical Exam: BP 126/67  Pulse 80  Temp(Src) 98.2 F (36.8 C) (Temporal)  Ht 5' 6"  (1.676 m)  Wt 356 lb 9.6 oz (161.753 kg)  BMI 57.56 kg/m2 General:   Alert,  Well-developed, morbidly obese. No acute distress. Skin:  Intact without significant lesions or rashes. Eyes:  Sclera clear, no icterus.   Conjunctiva pink. Ears:  Normal auditory acuity. Nose:  No deformity, discharge,  or lesions. Mouth:  No deformity or lesions. Neck:  Supple; no masses or thyromegaly. No significant cervical adenopathy. Lungs:  Clear throughout to auscultation.   No wheezes, crackles, or rhonchi. No acute distress. Heart:  Regular rate and rhythm; no murmurs, clicks, rubs,  or gallops. Abdomen: Massively obese. Positive bowel sounds. Soft nontender no obvious mass or organomegaly Pulses:  Normal pulses noted. Extremities:  Without clubbing or edema.  Impression/Plan:

## 2011-08-27 LAB — CBC WITH DIFFERENTIAL/PLATELET
Hemoglobin: 14.5 g/dL (ref 12.0–15.0)
Lymphs Abs: 2.2 10*3/uL (ref 0.7–4.0)
Monocytes Relative: 6 % (ref 3–12)
Neutro Abs: 6.3 10*3/uL (ref 1.7–7.7)
Neutrophils Relative %: 69 % (ref 43–77)
RBC: 4.79 MIL/uL (ref 3.87–5.11)
WBC: 9.2 10*3/uL (ref 4.0–10.5)

## 2011-08-27 LAB — BASIC METABOLIC PANEL
BUN: 16 mg/dL (ref 6–23)
Chloride: 100 mEq/L (ref 96–112)
Glucose, Bld: 124 mg/dL — ABNORMAL HIGH (ref 70–99)
Potassium: 4.2 mEq/L (ref 3.5–5.3)

## 2011-08-27 LAB — HEPATIC FUNCTION PANEL
AST: 15 U/L (ref 0–37)
Alkaline Phosphatase: 98 U/L (ref 39–117)
Bilirubin, Direct: 0.1 mg/dL (ref 0.0–0.3)
Total Bilirubin: 0.6 mg/dL (ref 0.3–1.2)

## 2011-08-27 LAB — HEPATITIS B SURFACE ANTIGEN: Hepatitis B Surface Ag: NEGATIVE

## 2011-08-27 LAB — PROTIME-INR: Prothrombin Time: 13 seconds (ref 11.6–15.2)

## 2011-09-07 NOTE — Progress Notes (Signed)
Results Cc to Dr Willey Blade

## 2011-10-06 ENCOUNTER — Other Ambulatory Visit: Payer: Self-pay

## 2011-10-06 DIAGNOSIS — K219 Gastro-esophageal reflux disease without esophagitis: Secondary | ICD-10-CM

## 2011-10-07 MED ORDER — PANTOPRAZOLE SODIUM 40 MG PO TBEC: 40.0000 mg | DELAYED_RELEASE_TABLET | Freq: Two times a day (BID) | ORAL | Status: DC

## 2011-11-06 ENCOUNTER — Other Ambulatory Visit: Payer: Self-pay | Admitting: Gastroenterology

## 2012-01-14 ENCOUNTER — Other Ambulatory Visit: Payer: Self-pay

## 2012-01-14 DIAGNOSIS — K760 Fatty (change of) liver, not elsewhere classified: Secondary | ICD-10-CM

## 2012-01-14 DIAGNOSIS — K219 Gastro-esophageal reflux disease without esophagitis: Secondary | ICD-10-CM

## 2012-01-14 DIAGNOSIS — K7689 Other specified diseases of liver: Secondary | ICD-10-CM

## 2012-02-03 ENCOUNTER — Telehealth: Payer: Self-pay | Admitting: Internal Medicine

## 2012-02-03 ENCOUNTER — Other Ambulatory Visit: Payer: Self-pay | Admitting: Internal Medicine

## 2012-02-03 DIAGNOSIS — K760 Fatty (change of) liver, not elsewhere classified: Secondary | ICD-10-CM

## 2012-02-03 NOTE — Telephone Encounter (Signed)
Patient is scheduled for Abd U/S on Aug 6th at 10:00 am NPO after midnight  and she is aware

## 2012-02-03 NOTE — Telephone Encounter (Signed)
I have already mail her the order for her blood work and Darius Bump will be calling her to set up her ultrasound.

## 2012-02-03 NOTE — Telephone Encounter (Signed)
August recall list has AFP and Liver ultrasound in 6 months

## 2012-02-09 ENCOUNTER — Ambulatory Visit (HOSPITAL_COMMUNITY)
Admission: RE | Admit: 2012-02-09 | Discharge: 2012-02-09 | Disposition: A | Discharge status: Home / Self Care | Payer: Medicare Other | Source: Ambulatory Visit | Attending: Internal Medicine | Admitting: Internal Medicine

## 2012-02-09 DIAGNOSIS — K7689 Other specified diseases of liver: Secondary | ICD-10-CM | POA: Insufficient documentation

## 2012-02-09 DIAGNOSIS — K760 Fatty (change of) liver, not elsewhere classified: Secondary | ICD-10-CM

## 2012-02-11 NOTE — Progress Notes (Signed)
Faxed to PCP

## 2012-02-12 ENCOUNTER — Other Ambulatory Visit: Payer: Self-pay | Admitting: Internal Medicine

## 2012-02-12 DIAGNOSIS — K219 Gastro-esophageal reflux disease without esophagitis: Secondary | ICD-10-CM

## 2012-02-12 DIAGNOSIS — K7689 Other specified diseases of liver: Secondary | ICD-10-CM

## 2012-02-26 ENCOUNTER — Other Ambulatory Visit: Payer: Self-pay | Admitting: Gastroenterology

## 2012-04-12 ENCOUNTER — Ambulatory Visit (HOSPITAL_COMMUNITY)
Admission: RE | Admit: 2012-04-12 | Discharge: 2012-04-12 | Disposition: A | Discharge status: Home / Self Care | Payer: Medicare Other | Source: Ambulatory Visit | Attending: Internal Medicine | Admitting: Internal Medicine

## 2012-04-12 DIAGNOSIS — I10 Essential (primary) hypertension: Secondary | ICD-10-CM | POA: Insufficient documentation

## 2012-04-12 DIAGNOSIS — E785 Hyperlipidemia, unspecified: Secondary | ICD-10-CM | POA: Insufficient documentation

## 2012-04-12 DIAGNOSIS — I4891 Unspecified atrial fibrillation: Secondary | ICD-10-CM | POA: Insufficient documentation

## 2012-04-12 DIAGNOSIS — I517 Cardiomegaly: Secondary | ICD-10-CM

## 2012-04-12 NOTE — Progress Notes (Signed)
*  PRELIMINARY RESULTS* Echocardiogram 2D Echocardiogram has been performed.  Tera Partridge 04/12/2012, 11:57 AM

## 2012-05-10 ENCOUNTER — Other Ambulatory Visit: Payer: Self-pay | Admitting: Gastroenterology

## 2012-05-11 ENCOUNTER — Other Ambulatory Visit: Payer: Self-pay

## 2012-05-12 MED ORDER — PANTOPRAZOLE SODIUM 40 MG PO TBEC: 40.0000 mg | DELAYED_RELEASE_TABLET | Freq: Every day | ORAL | Status: DC

## 2012-06-23 ENCOUNTER — Encounter: Payer: Self-pay | Admitting: *Deleted

## 2012-07-04 ENCOUNTER — Telehealth: Payer: Self-pay | Admitting: Internal Medicine

## 2012-07-04 ENCOUNTER — Other Ambulatory Visit: Payer: Self-pay | Admitting: Internal Medicine

## 2012-07-04 NOTE — Telephone Encounter (Signed)
Feb recall list has for patient to have Ultrasound in 6 months

## 2012-07-05 ENCOUNTER — Telehealth: Payer: Self-pay | Admitting: *Deleted

## 2012-07-05 NOTE — Telephone Encounter (Signed)
Correction, on Feb recall list for ultrasound in 6 months for Feb 2014.

## 2012-07-05 NOTE — Telephone Encounter (Signed)
On Feb recall list for ultrasound in 6 months

## 2012-07-25 ENCOUNTER — Other Ambulatory Visit: Payer: Self-pay

## 2012-07-25 DIAGNOSIS — K7689 Other specified diseases of liver: Secondary | ICD-10-CM

## 2012-07-26 ENCOUNTER — Encounter: Payer: Self-pay | Admitting: Internal Medicine

## 2012-07-26 NOTE — Telephone Encounter (Signed)
Letter has been mailed to the patient asking her to call our office and ask for the Referral Coordinator to get this scheduled.

## 2012-08-01 ENCOUNTER — Other Ambulatory Visit: Payer: Self-pay | Admitting: Internal Medicine

## 2012-08-01 DIAGNOSIS — K760 Fatty (change of) liver, not elsewhere classified: Secondary | ICD-10-CM

## 2012-08-08 ENCOUNTER — Other Ambulatory Visit: Payer: Self-pay | Admitting: Gastroenterology

## 2012-08-10 ENCOUNTER — Other Ambulatory Visit: Payer: Self-pay

## 2012-08-10 DIAGNOSIS — K7460 Unspecified cirrhosis of liver: Secondary | ICD-10-CM

## 2012-08-11 ENCOUNTER — Ambulatory Visit (HOSPITAL_COMMUNITY): Payer: Medicare Other

## 2012-08-18 ENCOUNTER — Ambulatory Visit (HOSPITAL_COMMUNITY): Payer: Medicare Other

## 2012-08-23 ENCOUNTER — Ambulatory Visit (HOSPITAL_COMMUNITY)
Admission: RE | Admit: 2012-08-23 | Discharge: 2012-08-23 | Disposition: A | Discharge status: Home / Self Care | Payer: Medicare Other | Source: Ambulatory Visit | Attending: Internal Medicine | Admitting: Internal Medicine

## 2012-08-23 DIAGNOSIS — K7689 Other specified diseases of liver: Secondary | ICD-10-CM | POA: Insufficient documentation

## 2012-08-23 DIAGNOSIS — E7800 Pure hypercholesterolemia, unspecified: Secondary | ICD-10-CM | POA: Insufficient documentation

## 2012-08-23 DIAGNOSIS — K760 Fatty (change of) liver, not elsewhere classified: Secondary | ICD-10-CM

## 2012-09-08 ENCOUNTER — Ambulatory Visit (INDEPENDENT_AMBULATORY_CARE_PROVIDER_SITE_OTHER): Payer: Medicare Other | Admitting: Urgent Care

## 2012-09-08 ENCOUNTER — Encounter: Payer: Self-pay | Admitting: Urgent Care

## 2012-09-08 VITALS — BP 130/67 | HR 77 | Temp 97.6°F | Ht 66.0 in | Wt 308.2 lb

## 2012-09-08 DIAGNOSIS — K219 Gastro-esophageal reflux disease without esophagitis: Secondary | ICD-10-CM

## 2012-09-08 DIAGNOSIS — K7460 Unspecified cirrhosis of liver: Secondary | ICD-10-CM

## 2012-09-08 DIAGNOSIS — K649 Unspecified hemorrhoids: Secondary | ICD-10-CM

## 2012-09-08 DIAGNOSIS — K7689 Other specified diseases of liver: Secondary | ICD-10-CM

## 2012-09-08 MED ORDER — HYDROCORTISONE 2.5 % RE CREA: TOPICAL_CREAM | Freq: Two times a day (BID) | RECTAL | Status: DC

## 2012-09-08 NOTE — Patient Instructions (Addendum)
Abdominal ultrasound August 2014 You need to call Dr. Willey Blade for hepatitis a and B. vaccines Keep up the great work with your diet and weight loss! Continue 1-2# weight loss per week until ideal body weight through exercise & diet. Low fat/cholesterol diet.   Avoid sweets, sodas, fruit juices, sweetened beverages like tea, etc. Gradually increase exercise from 15 min daily up to 1 hr per day 5 days/week. Limit alcohol use. Next colonoscopy 2017 Next EGD March 2015

## 2012-09-08 NOTE — Progress Notes (Signed)
Primary Care Physician:  Asencion Noble, MD Primary Gastroenterologist:  Dr. Gala Romney  Pre-Procedure History & Physical: HPI:  Patty Estrada is a 62 y.o. female here for followup NASH cirrhosis.  +coarse appearing liver c/w early cirrhosis on CT. Recent ABD US shows diffuse fatty liver, no mass.  AFP normal.  Hx GERD.  She has had an intentional  wt loss of 48# in past year.  She has been using an online food diary & counting calories.  She has been watching carbs & fats.  She has rare left-sided abdominal pain that is intermittent & resolves w/ BM.  Good appetite.  No jaundice or rash.  EGD last demonstrated fundal gland polyp. No stigmata of advanced chronic liver disease i.e. no varices or portal gastropathy. She had an adenomatous polyp. Due for surveillance 2017. GERD symptoms well controlled.  She never received hepatitis A and B vaccine suggested by Dr Gala Romney.  She has problems with rectocele, but would like to lose weight prior to any surgery.   Past Medical History  Diagnosis Date  . HTN (hypertension)   . Obesity   . GERD (gastroesophageal reflux disease)   . Hyperlipidemia   . Arthritis   . Adenomatous colon polyp 2006       . Depression   . Sleep apnea   . Fatty liver     ?cirrhosis on u/s in 2008  . Vitamin D deficiency     Past Surgical History  Procedure Laterality Date  . Tubal ligation    . Colonoscopy  04/09/2005    adenomatous polyps, two diverticula  . Esophagogastroduodenoscopy  2006    hyperplastic polyps, hh  . Esophagogastroduodenoscopy  12/2010    hh, multiple benign gastric polyps,   . Colonoscopy  12/2010    left-sided diverticula, adenomatous polyps, next TCS due 12/2015    Prior to Admission medications   Medication Sig Start Date End Date Taking? Authorizing Provider  acetaminophen (TYLENOL) 325 MG tablet Take 650 mg by mouth as needed.     Yes Historical Provider, MD  atenolol (TENORMIN) 50 MG tablet Take 50 mg by mouth daily.     Yes Historical Provider,  MD  atorvastatin (LIPITOR) 40 MG tablet Take 40 mg by mouth daily.     Yes Historical Provider, MD  benazepril (LOTENSIN) 40 MG tablet Take 40 mg by mouth daily.     Yes Historical Provider, MD  CELEBREX 100 MG capsule Take 1 capsule by mouth daily as needed. May take up to two 09/12/10  Yes Historical Provider, MD  Cholecalciferol (VITAMIN D3) 1000 UNITS tablet Take 1,000 Units by mouth daily.     Yes Historical Provider, MD  ergocalciferol (VITAMIN D2) 50000 UNITS capsule Take 50,000 Units by mouth once a week. Once every other week.   Yes Historical Provider, MD  furosemide (LASIX) 40 MG tablet Take 40 mg by mouth daily.    Yes Historical Provider, MD  ibuprofen (ADVIL) 200 MG tablet Take 200 mg by mouth as needed. Rarely   Yes Historical Provider, MD  pantoprazole (PROTONIX) 40 MG tablet Take 1 tablet (40 mg total) by mouth 2 (two) times daily. 04/06/11  Yes Laban Emperor, NP  potassium chloride SA (K-DUR,KLOR-CON) 20 MEQ tablet Take 1 tablet by mouth daily. 09/25/10  Yes Historical Provider, MD  Probiotic Product (ALIGN) 4 MG CAPS Take 4 mg by mouth daily. 09/30/10  Yes Neil Crouch, PA  sertraline (ZOLOFT) 50 MG tablet Take 50 mg by mouth daily.  Yes Historical Provider, MD  Cholecalciferol (VITAMIN D) 1000 UNITS capsule Take 1,000 Units by mouth every 14 (fourteen) days.     Historical Provider, MD  furosemide (LASIX) 80 MG tablet Take 1 tablet by mouth daily.  09/17/10   Historical Provider, MD    Allergies as of 09/08/2012 - Review Complete 09/08/2012  Allergen Reaction Noted  . Gabapentin    . Nitrofurantoin    Review of Systems: Gen: Denies any fever, chills, sweats, anorexia, fatigue, weakness, malaise, weight loss, and sleep disorder CV: Denies chest pain, angina, palpitations, syncope, orthopnea, PND, peripheral edema, and claudication. Resp: Denies dyspnea at rest, dyspnea with exercise, cough, sputum, wheezing, coughing up blood, and pleurisy. GI: Denies vomiting blood, jaundice, and  fecal incontinence.   Denies dysphagia or odynophagia. Derm: Denies rash, itching, dry skin, hives, moles, warts, or unhealing ulcers.  Psych: Denies depression, anxiety, memory loss, suicidal ideation, hallucinations, paranoia, and confusion. Heme: Denies bruising, bleeding, and enlarged lymph nodes.  Physical Exam: BP 130/67  Pulse 77  Temp(Src) 97.6 F (36.4 C) (Oral)  Ht 5' 6"  (1.676 m)  Wt 308 lb 3.2 oz (139.799 kg)  BMI 49.77 kg/m2 No LMP recorded. Patient is postmenopausal. General:   Alert,  Well-developed, morbidly obese, pleasant and cooperative in NAD Head:  Normocephalic and atraumatic. Eyes:  Sclera clear, no icterus.   Conjunctiva pink. Mouth:  No deformity or lesions.  Oropharynx pink & moist. Neck:  Supple; no masses or thyromegaly. Heart:  Regular rate and rhythm; no murmurs, clicks, rubs,  or gallops. Abdomen:   Protuberant.  Normal bowel sounds.  Soft, nontender and nondistended. No masses, hepatosplenomegaly or hernias noted. No guarding or rebound tenderness.   Msk:  Symmetrical. Pulses:  Normal pulses noted. Extremities:  Without edema. Neurologic:  Alert and  oriented x4;  grossly normal neurologically. Skin:  Intact without significant lesions or rashes. Cervical Nodes:  No significant cervical adenopathy. Psych:  Alert and cooperative. Normal mood and affect.

## 2012-09-09 ENCOUNTER — Encounter: Payer: Self-pay | Admitting: Urgent Care

## 2012-09-09 NOTE — Assessment & Plan Note (Signed)
Well controlled on pantoprazole 67m BID

## 2012-09-09 NOTE — Assessment & Plan Note (Signed)
NASH cirrhosis, well-compensated.  Abdominal ultrasound to screen for [place] up to date.  EGD up to date.  Needs Hep A/B vaccines.  SHe is doing very well with lifestyle & dietary changes & weight loss  ABD [place] August 2014 Call Dr. Willey Blade for hepatitis a and B. vaccines Commended on weight loss Continue 1-2# weight loss per week until ideal body weight through exercise & diet. Low fat/cholesterol diet.   Avoid sweets, sodas, fruit juices, sweetened beverages like tea, etc. Gradually increase exercise from 15 min daily up to 1 hr per day 5 days/week. Limit alcohol use. Next colonoscopy 2017 Next EGD March 2015

## 2012-09-12 NOTE — Progress Notes (Signed)
Faxed to PCP

## 2012-10-06 ENCOUNTER — Other Ambulatory Visit: Payer: Self-pay | Admitting: Gastroenterology

## 2013-01-10 ENCOUNTER — Encounter: Payer: Self-pay | Admitting: Internal Medicine

## 2013-01-10 ENCOUNTER — Telehealth: Payer: Self-pay | Admitting: Internal Medicine

## 2013-01-10 NOTE — Telephone Encounter (Signed)
Message copied by Feliberto Gottron on Tue Jan 10, 2013  4:23 PM ------      Message from: Rosanne Sack      Created: Tue Jan 10, 2013  3:33 PM       Patient needs a [place] per re-call list            Thanks            Ginger       ------

## 2013-01-10 NOTE — Telephone Encounter (Signed)
Letter has been mailed to the patient

## 2013-02-03 ENCOUNTER — Other Ambulatory Visit: Payer: Self-pay | Admitting: Urgent Care

## 2013-07-06 HISTORY — PX: DILATION AND CURETTAGE OF UTERUS: SHX78

## 2013-10-23 ENCOUNTER — Other Ambulatory Visit: Payer: Self-pay | Admitting: Gastroenterology

## 2013-10-26 ENCOUNTER — Telehealth: Payer: Self-pay | Admitting: *Deleted

## 2013-10-26 NOTE — Telephone Encounter (Signed)
Working on PA

## 2013-10-26 NOTE — Telephone Encounter (Signed)
Pt called wanting to see if we got anything from her insurance [employer] regarding her medication, pt said the [employer] needs a 'okay' stating she can have 60 because her insurance is only wanting to pay for 30. Please advise [phone]

## 2013-11-06 NOTE — Telephone Encounter (Signed)
Have not heard from insurance. PA has been faxed again

## 2013-11-06 NOTE — Telephone Encounter (Signed)
PA for pantoprazole bid was approved and faxed to the pharmacy.

## 2013-11-22 ENCOUNTER — Encounter (INDEPENDENT_AMBULATORY_CARE_PROVIDER_SITE_OTHER): Payer: Self-pay

## 2013-11-22 ENCOUNTER — Encounter: Payer: Self-pay | Admitting: Gastroenterology

## 2013-11-22 ENCOUNTER — Ambulatory Visit (INDEPENDENT_AMBULATORY_CARE_PROVIDER_SITE_OTHER): Payer: Medicare Other | Admitting: Gastroenterology

## 2013-11-22 VITALS — BP 114/66 | HR 66 | Temp 98.5°F | Ht 66.0 in | Wt 306.2 lb

## 2013-11-22 DIAGNOSIS — K219 Gastro-esophageal reflux disease without esophagitis: Secondary | ICD-10-CM

## 2013-11-22 DIAGNOSIS — K5900 Constipation, unspecified: Secondary | ICD-10-CM | POA: Insufficient documentation

## 2013-11-22 DIAGNOSIS — K7460 Unspecified cirrhosis of liver: Secondary | ICD-10-CM

## 2013-11-22 DIAGNOSIS — K649 Unspecified hemorrhoids: Secondary | ICD-10-CM

## 2013-11-22 DIAGNOSIS — J019 Acute sinusitis, unspecified: Secondary | ICD-10-CM | POA: Insufficient documentation

## 2013-11-22 MED ORDER — AMOXICILLIN-POT CLAVULANATE 875-125 MG PO TABS: 1.0000 | ORAL_TABLET | Freq: Two times a day (BID) | ORAL | Status: DC

## 2013-11-22 MED ORDER — POLYETHYLENE GLYCOL 3350 17 GM/SCOOP PO POWD: ORAL | Status: DC

## 2013-11-22 MED ORDER — HYDROCORTISONE 2.5 % RE CREA: TOPICAL_CREAM | Freq: Two times a day (BID) | RECTAL | Status: DC

## 2013-11-22 MED ORDER — PANTOPRAZOLE SODIUM 40 MG PO TBEC: 40.0000 mg | DELAYED_RELEASE_TABLET | Freq: Two times a day (BID) | ORAL | Status: DC

## 2013-11-22 NOTE — Progress Notes (Signed)
Primary Care Physician: Asencion Noble, MD  Primary Gastroenterologist:  Garfield Cornea, MD   Chief Complaint  Patient presents with  . Follow-up    HPI: Patty Estrada is a 63 y.o. female here for yearly followup. She has a history of [place] cirrhosis, GERD. She is overdue for hepatoma surveillance, labs. Last EGD June 2012 demonstrated fundal gland polyp, no stigmata of advanced chronic liver disease such as varices or portal gastropathy. She is due for surveillance colonoscopy in 12/2015 per history of adenomatous colon polyp at time of colonoscopy in June 2012.  Tried to cut back to once per day of protonix with recent insurance issues, reflux started acting up. Now approved for BID pantoprazole and doing better. Takes Align every day, two cups of Activia every day, stool softner every day, but if not having a daily BM, then gets hard to go due to rectocele. brbpr on the outside of stools. BMs worse with eating lean meat and veggies. Denies abd pain, memory issues, itching.  C/o sinus pain/congestion. Initially with URI 10 days ago. Was doing better but had return of sinus congestion, sinus pain, headache. No SOB or cough.  Current Outpatient Prescriptions  Medication Sig Dispense Refill  . acetaminophen (TYLENOL) 325 MG tablet Take 650 mg by mouth as needed.        Marland Kitchen atenolol (TENORMIN) 50 MG tablet Take 50 mg by mouth daily.        Marland Kitchen atorvastatin (LIPITOR) 40 MG tablet Take 40 mg by mouth daily.        . benazepril (LOTENSIN) 40 MG tablet Take 40 mg by mouth daily.        . Cholecalciferol (VITAMIN D3) 1000 UNITS tablet Take 1,000 Units by mouth daily.        Marland Kitchen docusate sodium (COLACE) 100 MG capsule Take 100 mg by mouth 2 (two) times daily.      . furosemide (LASIX) 40 MG tablet Take 20 mg by mouth daily.       . hydrocortisone 2.5 % cream APPLY RECTALLY TWICE DAILY.  30 g  0  . ibuprofen (ADVIL) 200 MG tablet Take 200 mg by mouth as needed. Rarely      . methocarbamol  (ROBAXIN) 500 MG tablet Take 500 mg by mouth daily.       . pantoprazole (PROTONIX) 40 MG tablet TAKE (1) TABLET TWICE DAILY.  60 tablet  3  . potassium chloride SA (K-DUR,KLOR-CON) 20 MEQ tablet Take 10 mEq by mouth daily.       . Probiotic Product (ALIGN) 4 MG CAPS Take 4 mg by mouth daily.  30 capsule  0  . sertraline (ZOLOFT) 50 MG tablet Take 50 mg by mouth daily.         No current facility-administered medications for this visit.    Allergies as of 11/22/2013 - Review Complete 11/22/2013  Allergen Reaction Noted  . Gabapentin    . Nitrofurantoin     Past Medical History  Diagnosis Date  . HTN (hypertension)   . Obesity   . GERD (gastroesophageal reflux disease)   . Hyperlipidemia   . Arthritis   . Adenomatous colon polyp 2006       . Depression   . Sleep apnea   . Fatty liver     ?cirrhosis on u/s in 2008  . Vitamin D deficiency    Past Surgical History  Procedure Laterality Date  . Tubal ligation    . Colonoscopy  04/09/2005  adenomatous polyps, two diverticula  . Esophagogastroduodenoscopy  2006    hyperplastic polyps, hh  . Esophagogastroduodenoscopy  12/2010    hh, multiple benign gastric polyps,   . Colonoscopy  12/2010    left-sided diverticula, adenomatous polyps, next TCS due 12/2015   Family History  Problem Relation Age of Onset  . Breast cancer Mother 25  . Heart attack Father 61  . Colon cancer Neg Hx   . Liver disease Neg Hx    History   Social History  . Marital Status: Married    Spouse Name: N/A    Number of Children: 2  . Years of Education: N/A   Social History Main Topics  . Smoking status: Former Smoker -- 2.00 packs/day for 13 years    Types: Cigarettes    Quit date: 07/06/1984  . Smokeless tobacco: Never Used  . Alcohol Use: No     Comment: maybe one or two drinks a year  . Drug Use: No  . Sexual Activity: No   Other Topics Concern  . 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     ROS:  General: Negative for anorexia, weight loss, fever, chills, fatigue, weakness. ENT: Negative for hoarseness, difficulty swallowing , nasal congestion. CV: Negative for chest pain, angina, palpitations, dyspnea on exertion, peripheral edema.  Respiratory: Negative for dyspnea at rest, dyspnea on exertion, cough, sputum, wheezing.  GI: See history of present illness. GU:  Negative for dysuria, hematuria, urinary incontinence, urinary frequency, nocturnal urination.  Endo: Negative for unusual weight change.    Physical Examination:   BP 114/66  Pulse 66  Temp(Src) 98.5 F (36.9 C) (Oral)  Ht 5' 6"  (1.676 m)  Wt 306 lb 3.2 oz (138.891 kg)  BMI 49.45 kg/m2  General: Well-nourished, well-developed in no acute distress.  Eyes: No icterus. Mouth: Oropharyngeal mucosa moist and pink , no lesions erythema or exudate. Lungs: Clear to auscultation bilaterally.  Heart: Regular rate and rhythm, no murmurs rubs or gallops.  Abdomen: Bowel sounds are normal, exam limited due to body habitus. nontender, nondistended, no hepatosplenomegaly or masses, no abdominal bruits or hernia , no rebound or guarding.   Extremities: No lower extremity edema. No clubbing or deformities. Neuro: Alert and oriented x 4   Skin: Warm and dry, no jaundice.   Psych: Alert and cooperative, normal mood and affect.    Imaging Studies: No results found.

## 2013-11-22 NOTE — Patient Instructions (Signed)
1. Please call when you are ready to schedule your ultrasound. 2. I will review labs from Dr. Ria Comment office and let you know if any further labs at this time. 3. Prescriptions for protonix, MiraLax, Proctozone sent to your pharmacy. 4. Prescription for Augmentin, take twice daily for 10 days for sinusitis. Also sent your pharmacy. 5. Office visit in 6 months with Dr. Gala Romney.  Instructions for fatty liver: Recommend 1-2# weight loss per week until ideal body weight through exercise & diet. Low fat/cholesterol diet.   Avoid sweets, sodas, fruit juices, sweetened beverages like tea, etc. Gradually increase exercise from 15 min daily up to 1 hr per day 5 days/week. Limit alcohol use.

## 2013-11-24 NOTE — Assessment & Plan Note (Signed)
augmentin bid for 10 days.

## 2013-11-24 NOTE — Assessment & Plan Note (Signed)
Add Miralax one capful daily.

## 2013-11-24 NOTE — Assessment & Plan Note (Signed)
Requires BID dosage for adequate control of GERD. Antireflux measures.

## 2013-11-24 NOTE — Assessment & Plan Note (Signed)
Continue proctozone prn. miralax to facilitate bowel movement.

## 2013-11-24 NOTE — Assessment & Plan Note (Signed)
Due abd u/s. Patient to call and schedule. Retrieve copy of labs from PCP.  Instructions for fatty liver: Recommend 1-2# weight loss per week until ideal body weight through exercise & diet. Low fat/cholesterol diet.   Avoid sweets, sodas, fruit juices, sweetened beverages like tea, etc. Gradually increase exercise from 15 min daily up to 1 hr per day 5 days/week. Limit alcohol use.

## 2013-11-29 NOTE — Progress Notes (Signed)
cc'd to pcp 

## 2014-02-02 NOTE — Progress Notes (Signed)
Please let patient know her PCP did not do LFTs back in 08/2013.  She still needs LFTs and U/S at her convenience.

## 2014-02-15 ENCOUNTER — Other Ambulatory Visit (INDEPENDENT_AMBULATORY_CARE_PROVIDER_SITE_OTHER): Payer: Medicare Other | Admitting: Obstetrics and Gynecology

## 2014-02-15 ENCOUNTER — Encounter: Payer: Self-pay | Admitting: Obstetrics and Gynecology

## 2014-02-15 ENCOUNTER — Ambulatory Visit (INDEPENDENT_AMBULATORY_CARE_PROVIDER_SITE_OTHER): Payer: Medicare Other | Admitting: Obstetrics and Gynecology

## 2014-02-15 VITALS — BP 118/78 | Ht 65.0 in | Wt 312.0 lb

## 2014-02-15 DIAGNOSIS — N816 Rectocele: Secondary | ICD-10-CM | POA: Diagnosis not present

## 2014-02-15 DIAGNOSIS — N841 Polyp of cervix uteri: Secondary | ICD-10-CM | POA: Insufficient documentation

## 2014-02-15 DIAGNOSIS — K5900 Constipation, unspecified: Secondary | ICD-10-CM | POA: Diagnosis not present

## 2014-02-15 DIAGNOSIS — K5909 Other constipation: Secondary | ICD-10-CM

## 2014-02-15 NOTE — Patient Instructions (Addendum)
[email or web address]  [facility] [place] [place], [place] [address] [phone]    Hysteroscopy Hysteroscopy is a procedure used for looking inside the womb (uterus). It may be done for various reasons, including:  To evaluate abnormal bleeding, fibroid (benign, noncancerous) tumors, polyps, scar tissue (adhesions), and possibly cancer of the uterus.  To look for lumps (tumors) and other uterine growths.  To look for causes of why a woman cannot get pregnant (infertility), causes of recurrent loss of pregnancy (miscarriages), or a lost intrauterine device (IUD).  To perform a sterilization by blocking the fallopian tubes from inside the uterus. In this procedure, a thin, flexible tube with a tiny light and camera on the end of it (hysteroscope) is used to look inside the uterus. A hysteroscopy should be done right after a menstrual period to be sure you are not pregnant. LET [facility] CARE PROVIDER KNOW ABOUT:   Any allergies you have.  All medicines you are taking, including vitamins, herbs, eye drops, creams, and over-the-counter medicines.  Previous problems you or members of your family have had with the use of anesthetics.  Any blood disorders you have.  Previous surgeries you have had.  Medical conditions you have. RISKS AND COMPLICATIONS  Generally, this is a safe procedure. However, as with any procedure, complications can occur. Possible complications include:  Putting a hole in the uterus.  Excessive bleeding.  Infection.  Damage to the cervix.  Injury to other organs.  Allergic reaction to medicines.  Too much fluid used in the uterus for the procedure. BEFORE THE PROCEDURE   Ask your health care provider about changing or stopping any regular medicines.  Do not take aspirin or blood thinners for 1 week before the procedure, or as directed by your health care provider. These can cause  bleeding.  If you smoke, do not smoke for 2 weeks before the procedure.  In some cases, a medicine is placed in the cervix the day before the procedure. This medicine makes the cervix have a larger opening (dilate). This makes it easier for the instrument to be inserted into the uterus during the procedure.  Do not eat or drink anything for at least 8 hours before the surgery.  Arrange for someone to take you home after the procedure. PROCEDURE   You may be given a medicine to relax you (sedative). You may also be given one of the following:  A medicine that numbs the area around the cervix (local anesthetic).  A medicine that makes you sleep through the procedure (general anesthetic).  The hysteroscope is inserted through the vagina into the uterus. The camera on the hysteroscope sends a picture to a TV screen. This gives the surgeon a good view inside the uterus.  During the procedure, air or a liquid is put into the uterus, which allows the surgeon to see better.  Sometimes, tissue is gently scraped from inside the uterus. These tissue samples are sent to a lab for testing. AFTER THE PROCEDURE   If you had a general anesthetic, you may be groggy for a couple hours after the procedure.  If you had a local anesthetic, you will be able to go home as soon as you are stable and feel ready.  You may have some cramping. This normally lasts for a couple days.  You may have bleeding, which varies from light spotting for a few days to menstrual-like bleeding for 3-7 days. This is normal.  If your test results  are not back during the visit, make an appointment with your health care provider to find out the results. Document Released: 09/28/2000 Document Revised: 04/12/2013 Document Reviewed: 01/19/2013 [facility] Patient Information 2015 [place], [place]. This information is not intended to replace advice given to you by your health care provider. Make sure you discuss any questions you have  with your health care provider.

## 2014-02-15 NOTE — Progress Notes (Signed)
Patient ID: Patty Estrada, female   DOB: 1950-08-04, 63 y.o.   MRN: [id number]  This chart was scribed by Erling Conte, Medical Scribe, for Dr. Mallory Shirk on 02/15/14 at 2:01 PM. This chart was reviewed by Dr. Mallory Shirk for accuracy.     [facility] Visit  Patient name: Patty Estrada MRN [id number]  Date of birth: Nov 22, 1950  CC & HPI:  Patty Estrada is a 63 y.o. female presenting today for a complaint that she has had spotting since July 2nd on and off. Pt states that it is very lite but it is there when she wipes at times. Pt states that she has had some pain but she thinks that it is a muscle thing and not related. Pt states that she notices the spotting more at night than during the day, has not noticed if it is related to BM's. Pt has previously been dx with rectocele so she states she sometimes has difficulty with bowel movements.  ROS:  +abnormal vaginal bleeding +back pain No other complaints at this time  Pertinent History Reviewed:   Reviewed: Significant for  Medical         Past Medical History  Diagnosis Date  . HTN (hypertension)   . Obesity   . GERD (gastroesophageal reflux disease)   . Hyperlipidemia   . Arthritis   . Adenomatous colon polyp 2006       . Depression   . Sleep apnea   . Fatty liver     ?cirrhosis on u/s in 2008  . Vitamin D deficiency   . Atrial fibrillation 2013                              Surgical Hx:    Past Surgical History  Procedure Laterality Date  . Tubal ligation    . Colonoscopy  04/09/2005    adenomatous polyps, two diverticula  . Esophagogastroduodenoscopy  2006    hyperplastic polyps, hh  . Esophagogastroduodenoscopy  12/2010    hh, multiple benign gastric polyps,   . Colonoscopy  12/2010    left-sided diverticula, adenomatous polyps, next TCS due 12/2015   Medications: Reviewed & Updated - see associated section                      Current outpatient prescriptions:acetaminophen (TYLENOL) 325  MG tablet, Take 650 mg by mouth as needed.  , Disp: , Rfl: ;  atenolol (TENORMIN) 50 MG tablet, Take 50 mg by mouth daily.  , Disp: , Rfl: ;  atorvastatin (LIPITOR) 40 MG tablet, Take 40 mg by mouth daily.  , Disp: , Rfl: ;  benazepril (LOTENSIN) 40 MG tablet, Take 40 mg by mouth daily.  , Disp: , Rfl: ;  furosemide (LASIX) 40 MG tablet, Take 20 mg by mouth daily. , Disp: , Rfl:  hydrocortisone (PROCTOZONE-HC) 2.5 % rectal cream, Place rectally 2 (two) times daily., Disp: 30 g, Rfl: 1;  methocarbamol (ROBAXIN) 500 MG tablet, Take 500 mg by mouth daily. , Disp: , Rfl: ;  pantoprazole (PROTONIX) 40 MG tablet, Take 1 tablet (40 mg total) by mouth 2 (two) times daily., Disp: 60 tablet, Rfl: 11;  polyethylene glycol powder (GLYCOLAX/MIRALAX) powder, One capful at bedtime as needed for constipation., Disp: 527 g, Rfl: 11 potassium chloride SA (K-DUR,KLOR-CON) 20 MEQ tablet, Take 10 mEq by mouth daily. , Disp: , Rfl: ;  Probiotic Product (ALIGN) 4 MG CAPS,  Take 4 mg by mouth daily., Disp: 30 capsule, Rfl: 0;  sertraline (ZOLOFT) 50 MG tablet, Take 50 mg by mouth daily.  , Disp: , Rfl:    Social History: Reviewed -  reports that she quit smoking about 29 years ago. Her smoking use included Cigarettes. She has a 26 pack-year smoking history. She has never used smokeless tobacco.  Objective Findings:  Vitals: Blood pressure 118/78, height 5' 5"  (1.651 m), weight 319 lb (144.697 kg).  Physical Examination: General appearance - alert, well appearing, and in no distress Abdomen - soft, nontender, nondistended, no masses or organomegaly Breasts - breasts appear normal, no suspicious masses, no skin or nipple changes or axillary nodes Pelvic - normal external genitalia, vulva, vagina, cervix, uterus and adnexa, VULVA: normal appearing vulva with no masses, tenderness or lesions VAGINA: normal appearing vagina with normal color and discharge, no lesions CERVIX: normal appearing cervix without discharge or lesions,  endocervical polyp size: 1 cm wide, several cm long. Polyp extends up further than can be visualized UTERUS: uterus is normal size, shape, consistency and nontender ADNEXA: normal adnexa in size, nontender and no masses Rectal - normal rectal, no masses, rectocele noted   Chaperone present for exam which was performed with pt's permission   Assessment & Plan:   A:  1. Large endometrial polyp 2. Rectocele, stable 3. Chronic constipation  P:  1. Schedule hysteroscopy DNC with removal of polyp 2. Miralax for chronic constipation 2. [facility], pt given info on [facility]. 4. Will schedule pre-op labs to include CBC, CMCT, TSH, T4, and lipids

## 2014-02-16 ENCOUNTER — Other Ambulatory Visit: Payer: Medicare Other

## 2014-02-16 ENCOUNTER — Encounter (HOSPITAL_COMMUNITY): Payer: Self-pay | Admitting: Pharmacy Technician

## 2014-02-19 ENCOUNTER — Ambulatory Visit (INDEPENDENT_AMBULATORY_CARE_PROVIDER_SITE_OTHER): Payer: Medicare Other | Admitting: Obstetrics and Gynecology

## 2014-02-19 VITALS — BP 122/68 | Ht 65.0 in | Wt 309.0 lb

## 2014-02-19 DIAGNOSIS — Z01818 Encounter for other preprocedural examination: Secondary | ICD-10-CM | POA: Diagnosis not present

## 2014-02-19 DIAGNOSIS — N840 Polyp of corpus uteri: Secondary | ICD-10-CM

## 2014-02-19 NOTE — Progress Notes (Signed)
Patient ID: Patty Estrada, female   DOB: 09/05/1950, 63 y.o.   MRN: [id number]  This chart was scribed by Donato Schultz, Medical Scribe, for Dr. Mallory Shirk on 02/19/2014 at 2:50 PM. This chart was reviewed by Dr. Mallory Shirk for accuracy.   [facility] Visit  Patient name: Patty Estrada MRN [id number]  Date of birth: 1950-11-02  CC & HPI:  Patty Estrada is a 63 y.o. female presenting today for a pre-op visit for a future hysteroscopy DNC with removal of polyp.  She states that she is still experiencing some light vaginal bleeding since her last visit.  Her surgery is scheduled for next Tuesday.  She does not want gastric surgery but would like to have a paniculectomy.    ROS:  All systems are reviewed and are negative unless otherwise indicated in the HPI  Pertinent History Reviewed:   Reviewed: Significant for  Medical         Past Medical History  Diagnosis Date  . HTN (hypertension)   . Obesity   . GERD (gastroesophageal reflux disease)   . Hyperlipidemia   . Arthritis   . Adenomatous colon polyp 2006       . Depression   . Sleep apnea   . Fatty liver     ?cirrhosis on u/s in 2008  . Vitamin D deficiency   . Atrial fibrillation 2013                              Surgical Hx:    Past Surgical History  Procedure Laterality Date  . Tubal ligation    . Colonoscopy  04/09/2005    adenomatous polyps, two diverticula  . Esophagogastroduodenoscopy  2006    hyperplastic polyps, hh  . Esophagogastroduodenoscopy  12/2010    hh, multiple benign gastric polyps,   . Colonoscopy  12/2010    left-sided diverticula, adenomatous polyps, next TCS due 12/2015   Medications: Reviewed & Updated - see associated section                      Current outpatient prescriptions:acetaminophen (TYLENOL) 650 MG CR tablet, Take 1,300 mg by mouth daily as needed for pain., Disp: , Rfl: ;  aspirin EC 81 MG tablet, Take 81 mg by mouth daily., Disp: , Rfl: ;  atenolol (TENORMIN)  50 MG tablet, Take 50 mg by mouth daily.  , Disp: , Rfl: ;  atorvastatin (LIPITOR) 40 MG tablet, Take 40 mg by mouth daily.  , Disp: , Rfl: ;  benazepril (LOTENSIN) 40 MG tablet, Take 40 mg by mouth daily.  , Disp: , Rfl:  furosemide (LASIX) 40 MG tablet, Take 20 mg by mouth daily. , Disp: , Rfl: ;  hydrocortisone (PROCTOZONE-HC) 2.5 % rectal cream, Place rectally 2 (two) times daily., Disp: 30 g, Rfl: 1;  methocarbamol (ROBAXIN) 500 MG tablet, Take 500 mg by mouth daily. , Disp: , Rfl: ;  pantoprazole (PROTONIX) 40 MG tablet, Take 1 tablet (40 mg total) by mouth 2 (two) times daily., Disp: 60 tablet, Rfl: 11 polyethylene glycol powder (GLYCOLAX/MIRALAX) powder, One capful at bedtime as needed for constipation., Disp: 527 g, Rfl: 11;  potassium chloride SA (K-DUR,KLOR-CON) 20 MEQ tablet, Take 20 mEq by mouth daily. , Disp: , Rfl: ;  Probiotic Product (ALIGN) 4 MG CAPS, Take 4 mg by mouth daily., Disp: 30 capsule, Rfl: 0;  sertraline (ZOLOFT) 50 MG tablet, Take 50  mg by mouth daily.  , Disp: , Rfl:    Social History: Reviewed -  reports that she quit smoking about 29 years ago. Her smoking use included Cigarettes. She has a 26 pack-year smoking history. She has never used smokeless tobacco.  Objective Findings:  Vitals: Blood pressure 122/68, height 5' 5"  (1.651 m), weight 309 lb (140.161 kg).  Physical Examination: Discussion only.   Assessment & Plan:   A:  1. Endometrial polyp  2. MORBID obesity,  1. Hysteroscopy DNC with removal of polyp scheduled for 02/27/2014 1-. Follow-up in 3 wks. 2. Refer to Bariatric clinic.

## 2014-02-20 ENCOUNTER — Other Ambulatory Visit: Payer: Medicare Other

## 2014-02-20 DIAGNOSIS — E782 Mixed hyperlipidemia: Secondary | ICD-10-CM

## 2014-02-20 DIAGNOSIS — Z7901 Long term (current) use of anticoagulants: Secondary | ICD-10-CM

## 2014-02-20 DIAGNOSIS — I10 Essential (primary) hypertension: Secondary | ICD-10-CM

## 2014-02-20 LAB — TSH: TSH: 3.024 u[IU]/mL (ref 0.350–4.500)

## 2014-02-20 LAB — CBC
HEMATOCRIT: 40.5 % (ref 36.0–46.0)
HEMOGLOBIN: 14.2 g/dL (ref 12.0–15.0)
MCH: 30.7 pg (ref 26.0–34.0)
MCHC: 35.1 g/dL (ref 30.0–36.0)
MCV: 87.7 fL (ref 78.0–100.0)
Platelets: 243 10*3/uL (ref 150–400)
RBC: 4.62 MIL/uL (ref 3.87–5.11)
RDW: 13.7 % (ref 11.5–15.5)
WBC: 7.4 10*3/uL (ref 4.0–10.5)

## 2014-02-20 LAB — COMPREHENSIVE METABOLIC PANEL
ALBUMIN: 3.9 g/dL (ref 3.5–5.2)
ALT: 16 U/L (ref 0–35)
AST: 12 U/L (ref 0–37)
Alkaline Phosphatase: 84 U/L (ref 39–117)
BILIRUBIN TOTAL: 0.8 mg/dL (ref 0.2–1.2)
BUN: 15 mg/dL (ref 6–23)
CO2: 27 meq/L (ref 19–32)
Calcium: 9.2 mg/dL (ref 8.4–10.5)
Chloride: 103 mEq/L (ref 96–112)
Creat: 0.59 mg/dL (ref 0.50–1.10)
GLUCOSE: 114 mg/dL — AB (ref 70–99)
Potassium: 4.1 mEq/L (ref 3.5–5.3)
SODIUM: 136 meq/L (ref 135–145)
TOTAL PROTEIN: 6.9 g/dL (ref 6.0–8.3)

## 2014-02-20 LAB — LIPID PANEL
CHOLESTEROL: 173 mg/dL (ref 0–200)
HDL: 38 mg/dL — AB (ref >39)
LDL Cholesterol: 101 mg/dL — ABNORMAL HIGH (ref 0–99)
TRIGLYCERIDES: 169 mg/dL — AB (ref <150)
Total CHOL/HDL Ratio: 4.6 Ratio
VLDL: 34 mg/dL (ref 0–40)

## 2014-02-20 NOTE — Patient Instructions (Signed)
Patty Estrada  02/20/2014   Your procedure is scheduled on:  02/27/2014  Report to [facility] at  58  AM.  Call this number if you have problems the morning of surgery: [phone]   Remember:   Do not eat food or drink liquids after midnight.   Take these medicines the morning of surgery with A SIP OF WATER: atenolol, benazepril, robaxin, protonix, zoloft   Do not wear jewelry, make-up or nail polish.  Do not wear lotions, powders, or perfumes.   Do not shave 48 hours prior to surgery. Men may shave face and neck.  Do not bring valuables to the hospital.  [facility] is not responsible for any belongings or valuables.               Contacts, dentures or bridgework may not be worn into surgery.  Leave suitcase in the car. After surgery it may be brought to your room.  For patients admitted to the hospital, discharge time is determined by your treatment team.               Patients discharged the day of surgery will not be allowed to drive home.  Name and phone number of your driver: family  Special Instructions: Shower using CHG 2 nights before surgery and the night before surgery.  If you shower the day of surgery use CHG.  Use special wash - you have one bottle of CHG for all showers.  You should use approximately 1/3 of the bottle for each shower.   Please read over the following fact sheets that you were given: Pain Booklet, Coughing and Deep Breathing, Surgical Site Infection Prevention, Anesthesia Post-op Instructions and Care and Recovery After Surgery Endometrial Ablation Endometrial ablation removes the lining of the uterus (endometrium). It is usually a same-day, outpatient treatment. Ablation helps avoid major surgery, such as surgery to remove the cervix and uterus (hysterectomy). After endometrial ablation, you will have little or no menstrual bleeding and may not be able to have children. However, if you are premenopausal, you will need to use a reliable  method of birth control following the procedure because of the small chance that pregnancy can occur. There are different reasons to have this procedure, which include:  Heavy periods.  Bleeding that is causing anemia.  Irregular bleeding.  Bleeding fibroids on the lining inside the uterus if they are smaller than 3 centimeters. This procedure should not be done if:  You want children in the future.  You have severe cramps with your menstrual period.  You have precancerous or cancerous cells in your uterus.  You were recently pregnant.  You have gone through menopause.  You have had major surgery on the uterus, such as a cesarean delivery. LET [facility] CARE PROVIDER KNOW ABOUT:  Any allergies you have.  All medicines you are taking, including vitamins, herbs, eye drops, creams, and over-the-counter medicines.  Previous problems you or members of your family have had with the use of anesthetics.  Any blood disorders you have.  Previous surgeries you have had.  Medical conditions you have. RISKS AND COMPLICATIONS  Generally, this is a safe procedure. However, as with any procedure, complications can occur. Possible complications include:  Perforation of the uterus.  Bleeding.  Infection of the uterus, bladder, or vagina.  Injury to surrounding organs.  An air bubble to the lung (air embolus).  Pregnancy following the procedure.  Failure of the procedure  to help the problem, requiring hysterectomy.  Decreased ability to diagnose cancer in the lining of the uterus. BEFORE THE PROCEDURE  The lining of the uterus must be tested to make sure there is no pre-cancerous or cancer cells present.  An ultrasound may be performed to look at the size of the uterus and to check for abnormalities.  Medicines may be given to thin the lining of the uterus. PROCEDURE  During the procedure, your health care provider will use a tool called a resectoscope to help see inside  your uterus. There are different ways to remove the lining of your uterus.   Radiofrequency - This method uses a radiofrequency-alternating electric current to remove the lining of the uterus.  Cryotherapy - This method uses extreme cold to freeze the lining of the uterus.  Heated-Free Liquid - This method uses heated salt (saline) solution to remove the lining of the uterus.  Microwave - This method uses high-energy microwaves to heat up the lining of the uterus to remove it.  Thermal balloon - This method involves inserting a catheter with a balloon tip into the uterus. The balloon tip is filled with heated fluid to remove the lining of the uterus. AFTER THE PROCEDURE  After your procedure, do not have sexual intercourse or insert anything into your vagina until permitted by your health care provider. After the procedure, you may experience:  Cramps.  Vaginal discharge.  Frequent urination. Document Released: 05/01/2004 Document Revised: 02/22/2013 Document Reviewed: 11/23/2012 [facility] Patient Information 2015 [place], [place]. This information is not intended to replace advice given to you by your health care provider. Make sure you discuss any questions you have with your health care provider.  Dilation and Curettage or Vacuum Curettage Dilation and curettage (D&C) and vacuum curettage are minor procedures. A D&C involves stretching (dilation) the cervix and scraping (curettage) the inside lining of the womb (uterus). During a D&C, tissue is gently scraped from the inside lining of the uterus. During a vacuum curettage, the lining and tissue in the uterus are removed with the use of gentle suction.  Curettage may be performed to either diagnose or treat a problem. As a diagnostic procedure, curettage is performed to examine tissues from the uterus. A diagnostic curettage may be performed for the following symptoms:   Irregular bleeding in the uterus.   Bleeding with the development  of clots.   Spotting between menstrual periods.   Prolonged menstrual periods.   Bleeding after menopause.   No menstrual period (amenorrhea).   A change in size and shape of the uterus.  As a treatment procedure, curettage may be performed for the following reasons:   Removal of an IUD (intrauterine device).   Removal of retained placenta after giving birth. Retained placenta can cause an infection or bleeding severe enough to require transfusions.   Abortion.   Miscarriage.   Removal of polyps inside the uterus.   Removal of uncommon types of noncancerous lumps (fibroids).  LET [facility] CARE PROVIDER KNOW ABOUT:   Any allergies you have.   All medicines you are taking, including vitamins, herbs, eye drops, creams, and over-the-counter medicines.   Previous problems you or members of your family have had with the use of anesthetics.   Any blood disorders you have.   Previous surgeries you have had.   Medical conditions you have. RISKS AND COMPLICATIONS  Generally, this is a safe procedure. However, as with any procedure, complications can occur. Possible complications include:  Excessive bleeding.  Infection of the uterus.   Damage to the cervix.   Development of scar tissue (adhesions) inside the uterus, later causing abnormal amounts of menstrual bleeding.   Complications from the general anesthetic, if a general anesthetic is used.   Putting a hole (perforation) in the uterus. This is rare.  BEFORE THE PROCEDURE   Eat and drink before the procedure only as directed by your health care provider.   Arrange for someone to take you home.  PROCEDURE  This procedure usually takes about 15-30 minutes.  You will be given one of the following:  A medicine that numbs the area in and around the cervix (local anesthetic).   A medicine to make you sleep through the procedure (general anesthetic). You will lie on your back with your  legs in stirrups. ocedure to disable a small amount of heart tissue in very specific places. The heart has many electrical connections. Sometimes these connections are abnormal and can cause the heart to beat very fast or irregularly. By disabling some of the problem areas, heart rhythm can be improved or made normal. Ablation is done for people who:   Have Wolff-Parkinson-White syndrome.   Have other fast heart rhythms (tachycardia).   Have taken medicines for an abnormal heart rhythm (arrhythmia) that resulted in:   No success.   Side effects.   May have a high-risk heartbeat that could result in death.  LET [facility] CARE PROVIDER KNOW ABOUT:   Any allergies you have or any previous reactions you have had to X-ray dye, food (such as seafood), medicine, or tape.   All medicines you are taking, including vitamins, herbs, eye drops, creams, and over-the-counter medicines.   Previous problems you or members of your family have had with the use of anesthetics.   Any blood disorders you have.   Previous surgeries or procedures (such as a kidney transplant) you have had.   Medical conditions you have (such as kidney failure).  RISKS AND COMPLICATIONS Generally, cardiac ablation is a safe procedure. However, problems can occur and include:   Increased risk of cancer. Depending on how long it takes to do the ablation, the dose of radiation can be high.  Bruising and bleeding where a thin, flexible tube (catheter) was inserted during the procedure.   Bleeding into the chest, especially into the sac that surrounds the heart (serious).  Need for a permanent pacemaker if the normal electrical system is damaged.   The procedure may not be fully effective, and this may not be recognized for months. Repeat ablation procedures are sometimes required. BEFORE THE PROCEDURE   Follow any instructions from your health care provider regarding eating and drinking before the  procedure.   Take your medicines as directed at regular times with water, unless instructed otherwise by your health care provider. If you are taking diabetes medicine, including insulin, ask how you are to take it and if there are any special instructions you should follow. It is common to adjust insulin dosing the day of the ablation.  PROCEDURE  An ablation is usually performed in a catheterization laboratory with the guidance of fluoroscopy. Fluoroscopy is a type of X-ray that helps your health care provider see images of your heart during the procedure.   An ablation is a minimally invasive procedure. This means a small cut (incision) is made in either your neck or groin. Your health care provider will decide where to make the incision based on your medical history and physical exam.  An IV tube will be started before the procedure begins. You will be given an anesthetic or medicine to help you relax (sedative).  The skin on your neck or groin will be numbed. A needle will be inserted into a large vein in your neck or groin and catheters will be threaded to your heart.  A special dye that shows up on fluoroscopy pictures may be injected through the catheter. The dye helps your health care provider see the area of the heart that needs treatment.  The catheter has electrodes on the tip. When the area of heart tissue that is causing the arrhythmia is found, the catheter tip will send an electrical current to the area and "scar" the tissue. Three types of energy can be used to ablate the heart tissue:   Heat (radiofrequency energy).   Laser energy.   Extreme cold (cryoablation).   When the area of the heart has been ablated, the catheter will be taken out. Pressure will be held on the insertion site. This will help the insertion site clot and keep it from bleeding. A bandage will be placed on the insertion site.  AFTER THE PROCEDURE   After the procedure, you will be taken to a  recovery area where your vital signs (blood pressure, heart rate, and breathing) will be monitored. The insertion site will also be monitored for bleeding.   You will need to lie still for 4-6 hours. This is to ensure you do not bleed from the catheter insertion site.  Document Released: 11/08/2008 Document Revised: 11/06/2013 Document Reviewed: 11/14/2012 [facility] Patient Information 2015 [place], [place]. This information is not intended to replace advice given to you by your health care provider. Make sure you discuss any questions you have with your health care provider. Hysteroscopy  Hystero  A warm metal or plastic instrument (speculum) will be placed in your vagina to keep it open and to allow the health care provider to see the cervix.  There are two ways in which your cervix can be softened and dilated. These include:   Taking a medicine.   Having thin rods (laminaria) inserted into your cervix.   A curved tool (curette) will be used to scrape cells from the inside lining of the uterus. In some cases, gentle suction is applied with the curette. The curette will then be removed.  AFTER THE PROCEDURE   You will rest in the recovery area until you are stable and are ready to go home.   You may feel sick to your stomach (nauseous) or throw up (vomit) if you were given a general anesthetic.   You may have a sore throat if a tube was placed in your throat during general anesthesia.   You may have light cramping and bleeding. This may last for 2 days to 2 weeks after the procedure.   Your uterus needs to make a new lining after the procedure. This may make your next period late. Document Released: 06/22/2005 Document Revised: 02/22/2013 Document Reviewed: 01/19/2013 [facility] Patient Information 2015 [place], [place]. This information is not intended to replace advice given to you by your health care provider. Make sure you discuss any questions you have with your health care  provider. PATIENT INSTRUCTIONS POST-ANESTHESIA  IMMEDIATELY FOLLOWING SURGERY:  Do not drive or operate machinery for the first twenty four hours after surgery.  Do not make any important decisions for twenty four hours after surgery or while taking narcotic pain medications or sedatives.  If you develop intractable  nausea and vomiting or a severe headache please notify your doctor immediately.  FOLLOW-UP:  Please make an appointment with your surgeon as instructed. You do not need to follow up with anesthesia unless specifically instructed to do so.  WOUND CARE INSTRUCTIONS (if applicable):  Keep a dry clean dressing on the anesthesia/puncture wound site if there is drainage.  Once the wound has quit draining you may leave it open to air.  Generally you should leave the bandage intact for twenty four hours unless there is drainage.  If the epidural site drains for more than 36-48 hours please call the anesthesia department.  QUESTIONS?:  Please feel free to call your physician or the hospital operator if you have any questions, and they will be happy to assist you.

## 2014-02-21 ENCOUNTER — Other Ambulatory Visit: Payer: Self-pay | Admitting: Obstetrics and Gynecology

## 2014-02-21 ENCOUNTER — Encounter (HOSPITAL_COMMUNITY)
Admission: RE | Admit: 2014-02-21 | Discharge: 2014-02-21 | Disposition: A | Discharge status: Home / Self Care | Payer: Medicare Other | Source: Ambulatory Visit | Attending: Obstetrics and Gynecology | Admitting: Obstetrics and Gynecology

## 2014-02-21 ENCOUNTER — Encounter (HOSPITAL_COMMUNITY): Payer: Self-pay

## 2014-02-21 DIAGNOSIS — N840 Polyp of corpus uteri: Secondary | ICD-10-CM | POA: Diagnosis not present

## 2014-02-21 DIAGNOSIS — Z6841 Body Mass Index (BMI) 40.0 and over, adult: Secondary | ICD-10-CM | POA: Diagnosis not present

## 2014-02-21 DIAGNOSIS — Z01812 Encounter for preprocedural laboratory examination: Secondary | ICD-10-CM | POA: Insufficient documentation

## 2014-02-21 DIAGNOSIS — Z01818 Encounter for other preprocedural examination: Secondary | ICD-10-CM | POA: Diagnosis not present

## 2014-02-21 LAB — URINALYSIS, ROUTINE W REFLEX MICROSCOPIC
Bilirubin Urine: NEGATIVE
GLUCOSE, UA: NEGATIVE mg/dL
Ketones, ur: NEGATIVE mg/dL
Nitrite: NEGATIVE
PH: 7 (ref 5.0–8.0)
Protein, ur: NEGATIVE mg/dL
Specific Gravity, Urine: 1.01 (ref 1.005–1.030)
Urobilinogen, UA: 0.2 mg/dL (ref 0.0–1.0)

## 2014-02-21 LAB — CBC
HEMATOCRIT: 42.7 % (ref 36.0–46.0)
HEMOGLOBIN: 14.5 g/dL (ref 12.0–15.0)
MCH: 31.3 pg (ref 26.0–34.0)
MCHC: 34 g/dL (ref 30.0–36.0)
MCV: 92 fL (ref 78.0–100.0)
Platelets: 229 10*3/uL (ref 150–400)
RBC: 4.64 MIL/uL (ref 3.87–5.11)
RDW: 13 % (ref 11.5–15.5)
WBC: 6.6 10*3/uL (ref 4.0–10.5)

## 2014-02-21 LAB — BASIC METABOLIC PANEL
Anion gap: 15 (ref 5–15)
BUN: 13 mg/dL (ref 6–23)
CALCIUM: 9.6 mg/dL (ref 8.4–10.5)
CHLORIDE: 100 meq/L (ref 96–112)
CO2: 25 meq/L (ref 19–32)
CREATININE: 0.64 mg/dL (ref 0.50–1.10)
GFR calc Af Amer: >90 mL/min (ref >90)
GFR calc non Af Amer: >90 mL/min (ref >90)
Glucose, Bld: 140 mg/dL — ABNORMAL HIGH (ref 70–99)
Potassium: 3.9 mEq/L (ref 3.7–5.3)
Sodium: 140 mEq/L (ref 137–147)

## 2014-02-21 LAB — URINE MICROSCOPIC-ADD ON

## 2014-02-21 NOTE — Progress Notes (Signed)
02/21/14 1401  OBSTRUCTIVE SLEEP APNEA  Have you ever been diagnosed with sleep apnea through a sleep study? No  Do you snore loudly (loud enough to be heard through closed doors)?  1  Do you often feel tired, fatigued, or sleepy during the daytime? 0  Has anyone observed you stop breathing during your sleep? 0  Do you have, or are you being treated for high blood pressure? 1  BMI more than 35 kg/m2? 1  Age over 61 years old? 1  Neck circumference greater than 40 cm/16 inches? 0  Gender: 0  Obstructive Sleep Apnea Score 4  Score 4 or greater  Results sent to PCP

## 2014-02-26 NOTE — Progress Notes (Signed)
After reviewing pts chart- she just had blood work done last week at the hospital. Routing to [facility] for review.

## 2014-02-27 ENCOUNTER — Encounter (HOSPITAL_COMMUNITY): Payer: Self-pay | Admitting: *Deleted

## 2014-02-27 ENCOUNTER — Encounter (HOSPITAL_COMMUNITY): Payer: Medicare Other | Admitting: Anesthesiology

## 2014-02-27 ENCOUNTER — Ambulatory Visit (HOSPITAL_COMMUNITY): Payer: Medicare Other | Admitting: Anesthesiology

## 2014-02-27 ENCOUNTER — Ambulatory Visit (HOSPITAL_COMMUNITY)
Admission: RE | Admit: 2014-02-27 | Discharge: 2014-02-27 | Disposition: A | Discharge status: Home / Self Care | Payer: Medicare Other | Source: Ambulatory Visit | Attending: Obstetrics and Gynecology | Admitting: Obstetrics and Gynecology

## 2014-02-27 ENCOUNTER — Encounter (HOSPITAL_COMMUNITY)
Admission: RE | Disposition: A | Discharge status: Home / Self Care | Payer: Self-pay | Source: Ambulatory Visit | Attending: Obstetrics and Gynecology

## 2014-02-27 DIAGNOSIS — Z6841 Body Mass Index (BMI) 40.0 and over, adult: Secondary | ICD-10-CM | POA: Diagnosis not present

## 2014-02-27 DIAGNOSIS — K219 Gastro-esophageal reflux disease without esophagitis: Secondary | ICD-10-CM | POA: Diagnosis not present

## 2014-02-27 DIAGNOSIS — F3289 Other specified depressive episodes: Secondary | ICD-10-CM | POA: Insufficient documentation

## 2014-02-27 DIAGNOSIS — Z87891 Personal history of nicotine dependence: Secondary | ICD-10-CM | POA: Diagnosis not present

## 2014-02-27 DIAGNOSIS — Z79899 Other long term (current) drug therapy: Secondary | ICD-10-CM | POA: Insufficient documentation

## 2014-02-27 DIAGNOSIS — N840 Polyp of corpus uteri: Secondary | ICD-10-CM | POA: Diagnosis not present

## 2014-02-27 DIAGNOSIS — Z7982 Long term (current) use of aspirin: Secondary | ICD-10-CM | POA: Diagnosis not present

## 2014-02-27 DIAGNOSIS — E785 Hyperlipidemia, unspecified: Secondary | ICD-10-CM | POA: Insufficient documentation

## 2014-02-27 DIAGNOSIS — I10 Essential (primary) hypertension: Secondary | ICD-10-CM | POA: Diagnosis not present

## 2014-02-27 DIAGNOSIS — F329 Major depressive disorder, single episode, unspecified: Secondary | ICD-10-CM | POA: Insufficient documentation

## 2014-02-27 DIAGNOSIS — Z01818 Encounter for other preprocedural examination: Secondary | ICD-10-CM

## 2014-02-27 DIAGNOSIS — E669 Obesity, unspecified: Secondary | ICD-10-CM | POA: Insufficient documentation

## 2014-02-27 DIAGNOSIS — N841 Polyp of cervix uteri: Secondary | ICD-10-CM | POA: Diagnosis not present

## 2014-02-27 HISTORY — PX: POLYPECTOMY: SHX5525

## 2014-02-27 HISTORY — PX: HYSTEROSCOPY: SHX211

## 2014-02-27 SURGERY — POLYPECTOMY
Anesthesia: General

## 2014-02-27 MED ORDER — PROPOFOL 10 MG/ML IV BOLUS
INTRAVENOUS | Status: DC | PRN
  Administered 2014-02-27: 180 mg via INTRAVENOUS

## 2014-02-27 MED ORDER — EPHEDRINE SULFATE 50 MG/ML IJ SOLN
INTRAMUSCULAR | Status: AC
  Filled 2014-02-27: qty 1

## 2014-02-27 MED ORDER — NEOSTIGMINE METHYLSULFATE 10 MG/10ML IV SOLN
INTRAVENOUS | Status: AC
  Filled 2014-02-27: qty 1

## 2014-02-27 MED ORDER — EPHEDRINE SULFATE 50 MG/ML IJ SOLN
INTRAMUSCULAR | Status: DC | PRN
  Administered 2014-02-27: 10 mg via INTRAVENOUS

## 2014-02-27 MED ORDER — SODIUM CHLORIDE 0.9 % IJ SOLN
INTRAMUSCULAR | Status: AC
  Filled 2014-02-27: qty 10

## 2014-02-27 MED ORDER — PROPOFOL 10 MG/ML IV BOLUS
INTRAVENOUS | Status: AC
  Filled 2014-02-27: qty 20

## 2014-02-27 MED ORDER — ONDANSETRON HCL 4 MG/2ML IJ SOLN
4.0000 mg | Freq: Once | INTRAMUSCULAR | Status: AC
  Administered 2014-02-27: 4 mg via INTRAVENOUS

## 2014-02-27 MED ORDER — SODIUM CHLORIDE 0.9 % IR SOLN
Status: DC | PRN
  Administered 2014-02-27: 1000 mL

## 2014-02-27 MED ORDER — ONDANSETRON HCL 4 MG/2ML IJ SOLN
INTRAMUSCULAR | Status: AC
  Filled 2014-02-27: qty 2

## 2014-02-27 MED ORDER — BUPIVACAINE-EPINEPHRINE (PF) 0.5% -1:200000 IJ SOLN
INTRAMUSCULAR | Status: AC
  Filled 2014-02-27: qty 30

## 2014-02-27 MED ORDER — SUCCINYLCHOLINE CHLORIDE 20 MG/ML IJ SOLN
INTRAMUSCULAR | Status: DC | PRN
  Administered 2014-02-27: 120 mg via INTRAVENOUS

## 2014-02-27 MED ORDER — LACTATED RINGERS IV SOLN
INTRAVENOUS | Status: DC
  Administered 2014-02-27: 1000 mL via INTRAVENOUS

## 2014-02-27 MED ORDER — FENTANYL CITRATE 0.05 MG/ML IJ SOLN
25.0000 ug | INTRAMUSCULAR | Status: DC | PRN
  Administered 2014-02-27: 25 ug via INTRAVENOUS
  Filled 2014-02-27: qty 2

## 2014-02-27 MED ORDER — ONDANSETRON HCL 4 MG/2ML IJ SOLN: 4.0000 mg | Freq: Once | INTRAMUSCULAR | Status: DC | PRN

## 2014-02-27 MED ORDER — LIDOCAINE HCL (CARDIAC) 20 MG/ML IV SOLN
INTRAVENOUS | Status: DC | PRN
  Administered 2014-02-27: 50 mg via INTRAVENOUS

## 2014-02-27 MED ORDER — MIDAZOLAM HCL 2 MG/2ML IJ SOLN
INTRAMUSCULAR | Status: AC
  Filled 2014-02-27: qty 2

## 2014-02-27 MED ORDER — GLYCOPYRROLATE 0.2 MG/ML IJ SOLN
INTRAMUSCULAR | Status: AC
  Filled 2014-02-27: qty 3

## 2014-02-27 MED ORDER — CEFOTETAN DISODIUM-DEXTROSE 2-2.08 GM-% IV SOLR
2.0000 g | INTRAVENOUS | Status: AC
  Administered 2014-02-27: 2 g via INTRAVENOUS
  Filled 2014-02-27: qty 50

## 2014-02-27 MED ORDER — NEOSTIGMINE METHYLSULFATE 10 MG/10ML IV SOLN
INTRAVENOUS | Status: DC | PRN
  Administered 2014-02-27: 4 mg via INTRAVENOUS

## 2014-02-27 MED ORDER — ROCURONIUM BROMIDE 100 MG/10ML IV SOLN
INTRAVENOUS | Status: DC | PRN
  Administered 2014-02-27: 20 mg via INTRAVENOUS

## 2014-02-27 MED ORDER — DEXAMETHASONE SODIUM PHOSPHATE 4 MG/ML IJ SOLN
INTRAMUSCULAR | Status: AC
  Filled 2014-02-27: qty 1

## 2014-02-27 MED ORDER — SODIUM CHLORIDE 0.9 % IR SOLN
Status: DC | PRN
  Administered 2014-02-27: 3000 mL

## 2014-02-27 MED ORDER — FENTANYL CITRATE 0.05 MG/ML IJ SOLN
INTRAMUSCULAR | Status: DC | PRN
  Administered 2014-02-27 (×2): 50 ug via INTRAVENOUS
  Administered 2014-02-27: 100 ug via INTRAVENOUS
  Administered 2014-02-27: 50 ug via INTRAVENOUS

## 2014-02-27 MED ORDER — GLYCOPYRROLATE 0.2 MG/ML IJ SOLN
0.2000 mg | Freq: Once | INTRAMUSCULAR | Status: AC
  Administered 2014-02-27: 0.2 mg via INTRAVENOUS

## 2014-02-27 MED ORDER — DEXAMETHASONE SODIUM PHOSPHATE 4 MG/ML IJ SOLN
4.0000 mg | Freq: Once | INTRAMUSCULAR | Status: AC
  Administered 2014-02-27: 4 mg via INTRAVENOUS

## 2014-02-27 MED ORDER — GLYCOPYRROLATE 0.2 MG/ML IJ SOLN
INTRAMUSCULAR | Status: AC
  Filled 2014-02-27: qty 1

## 2014-02-27 MED ORDER — FENTANYL CITRATE 0.05 MG/ML IJ SOLN
INTRAMUSCULAR | Status: AC
  Filled 2014-02-27: qty 5

## 2014-02-27 MED ORDER — ROCURONIUM BROMIDE 50 MG/5ML IV SOLN
INTRAVENOUS | Status: AC
  Filled 2014-02-27: qty 1

## 2014-02-27 MED ORDER — LIDOCAINE HCL (PF) 1 % IJ SOLN
INTRAMUSCULAR | Status: AC
  Filled 2014-02-27: qty 5

## 2014-02-27 MED ORDER — GLYCOPYRROLATE 0.2 MG/ML IJ SOLN
INTRAMUSCULAR | Status: DC | PRN
  Administered 2014-02-27: 0.6 mg via INTRAVENOUS

## 2014-02-27 MED ORDER — MIDAZOLAM HCL 2 MG/2ML IJ SOLN
1.0000 mg | INTRAMUSCULAR | Status: DC | PRN
  Administered 2014-02-27: 2 mg via INTRAVENOUS

## 2014-02-27 MED ORDER — LACTATED RINGERS IV SOLN
INTRAVENOUS | Status: DC | PRN
  Administered 2014-02-27 (×2): via INTRAVENOUS

## 2014-02-27 MED ORDER — SUCCINYLCHOLINE CHLORIDE 20 MG/ML IJ SOLN
INTRAMUSCULAR | Status: AC
  Filled 2014-02-27: qty 1

## 2014-02-27 SURGICAL SUPPLY — 43 items
BAG DECANTER FOR FLEXI CONT (MISCELLANEOUS) IMPLANT
BAG HAMPER (MISCELLANEOUS) ×3 IMPLANT
CATH ROBINSON RED A/P 16FR (CATHETERS) IMPLANT
CATH THERMACHOICE III (CATHETERS) IMPLANT
CLOTH BEACON ORANGE TIMEOUT ST (SAFETY) ×3 IMPLANT
COVER LIGHT HANDLE STERIS (MISCELLANEOUS) ×6 IMPLANT
DECANTER SPIKE VIAL GLASS SM (MISCELLANEOUS) ×3 IMPLANT
DRAPE PROXIMA HALF (DRAPES) ×3 IMPLANT
DRAPE STERI URO 9X17 APER PCH (DRAPES) IMPLANT
ELECT REM PT RETURN 9FT ADLT (ELECTROSURGICAL) ×3
ELECTRODE REM PT RTRN 9FT ADLT (ELECTROSURGICAL) ×2 IMPLANT
FORMALIN 10 PREFIL 120ML (MISCELLANEOUS) ×3 IMPLANT
FORMALIN 10 PREFIL 480ML (MISCELLANEOUS) ×3 IMPLANT
GAUZE PACKING 2X5 YD STRL (GAUZE/BANDAGES/DRESSINGS) IMPLANT
GLOVE BIOGEL PI IND STRL 7.0 (GLOVE) ×4 IMPLANT
GLOVE BIOGEL PI IND STRL 9 (GLOVE) ×2 IMPLANT
GLOVE BIOGEL PI INDICATOR 7.0 (GLOVE) ×2
GLOVE BIOGEL PI INDICATOR 9 (GLOVE) ×1
GLOVE ECLIPSE 9.0 STRL (GLOVE) ×3 IMPLANT
GLOVE EXAM NITRILE MD LF STRL (GLOVE) ×3 IMPLANT
GLOVE SS BIOGEL STRL SZ 6.5 (GLOVE) ×2 IMPLANT
GLOVE SUPERSENSE BIOGEL SZ 6.5 (GLOVE) ×1
GOWN SPEC L3 XXLG W/TWL (GOWN DISPOSABLE) ×3 IMPLANT
GOWN STRL REUS W/TWL LRG LVL3 (GOWN DISPOSABLE) ×6 IMPLANT
INST SET HYSTEROSCOPY (KITS) ×3 IMPLANT
IV D5W 500ML (IV SOLUTION) IMPLANT
IV NS IRRIG 3000ML ARTHROMATIC (IV SOLUTION) ×3 IMPLANT
KIT ROOM TURNOVER AP CYSTO (KITS) ×3 IMPLANT
KIT ROOM TURNOVER APOR (KITS) IMPLANT
MANIFOLD NEPTUNE II (INSTRUMENTS) ×3 IMPLANT
NS IRRIG 1000ML POUR BTL (IV SOLUTION) ×3 IMPLANT
PACK PERI GYN (CUSTOM PROCEDURE TRAY) ×3 IMPLANT
PAD ARMBOARD 7.5X6 YLW CONV (MISCELLANEOUS) ×3 IMPLANT
PAD TELFA 3X4 1S STER (GAUZE/BANDAGES/DRESSINGS) ×3 IMPLANT
SET BASIN LINEN APH (SET/KITS/TRAYS/PACK) ×3 IMPLANT
SET IRRIG Y TYPE TUR BLADDER L (SET/KITS/TRAYS/PACK) ×3 IMPLANT
SET IV ADMIN VERSALIGHT (MISCELLANEOUS) IMPLANT
SUT CHROMIC 2 0 CT 1 (SUTURE) IMPLANT
SUT PROLENE 2 0 FS (SUTURE) IMPLANT
SUT VIC AB 0 CT2 8-18 (SUTURE) IMPLANT
SYR CONTROL 10ML LL (SYRINGE) ×3 IMPLANT
SYRINGE CONTROL L 12CC (SYRINGE) IMPLANT
VERSALIGHT (MISCELLANEOUS) IMPLANT

## 2014-02-27 NOTE — Discharge Instructions (Signed)
Hysteroscopy, Care After Refer to this sheet in the next few weeks. These instructions provide you with information on caring for yourself after your procedure. Your health care provider may also give you more specific instructions. Your treatment has been planned according to current medical practices, but problems sometimes occur. Call your health care provider if you have any problems or questions after your procedure.  WHAT TO EXPECT AFTER THE PROCEDURE After your procedure, it is typical to have the following:  You may have some cramping. This normally lasts for a couple days.  You may have bleeding. This can vary from light spotting for a few days to menstrual-like bleeding for 3-7 days. HOME CARE INSTRUCTIONS  Rest for the first 1-2 days after the procedure.  Only take over-the-counter or prescription medicines as directed by your health care provider. Do not take aspirin. It can increase the chances of bleeding.  Take showers instead of baths for 2 weeks or as directed by your health care provider.  Do not drive for 24 hours or as directed.  Do not drink alcohol while taking pain medicine.  Do not use tampons, douche, or have sexual intercourse for 2 weeks or until your health care provider says it is okay.  Take your temperature twice a day for 4-5 days. Write it down each time.  Follow your health care provider's advice about diet, exercise, and lifting.  If you develop constipation, you may:  Take a mild laxative if your health care provider approves.  Add bran foods to your diet.  Drink enough fluids to keep your urine clear or pale yellow.  Try to have someone with you or available to you for the first 24-48 hours, especially if you were given a general anesthetic.  Follow up with your health care provider as directed. SEEK MEDICAL CARE IF:  You feel dizzy or lightheaded.  You feel sick to your stomach (nauseous).  You have abnormal vaginal discharge.  You  have a rash.  You have pain that is not controlled with medicine. SEEK IMMEDIATE MEDICAL CARE IF:  You have bleeding that is heavier than a normal menstrual period.  You have a fever.  You have increasing cramps or pain, not controlled with medicine.  You have new belly (abdominal) pain.  You pass out.  You have pain in the tops of your shoulders (shoulder strap areas).  You have shortness of breath. Document Released: 04/12/2013 Document Reviewed: 04/12/2013 [facility] Patient Information 2015 [place], [place]. This information is not intended to replace advice given to you by your health care provider. Make sure you discuss any questions you have with your health care provider.

## 2014-02-27 NOTE — Op Note (Signed)
See the details of the operative procedure listed in the brief operative note

## 2014-02-27 NOTE — Anesthesia Postprocedure Evaluation (Signed)
  Anesthesia Post-op Note  Patient: Patty Estrada  Procedure(s) Performed: Procedure(s): REMOVAL OF ENDOCERVICAL POLYP AND ENDOMETRIAL POLYPS (N/A) DILATATION AND CURETTAGE /HYSTEROSCOPY (N/A)  Patient Location: PACU  Anesthesia Type:General  Level of Consciousness: awake, alert , oriented and patient cooperative  Airway and Oxygen Therapy: Patient Spontanous Breathing and Patient connected to face mask oxygen  Post-op Pain: mild  Post-op Assessment: Post-op Vital signs reviewed, Patient's Cardiovascular Status Stable, Respiratory Function Stable, Patent Airway, No signs of Nausea or vomiting and Pain level controlled  Post-op Vital Signs: Reviewed and stable  Last Vitals:  Filed Vitals:   02/27/14 0725  BP: 114/54  Pulse:   Temp:   Resp: 24    Complications: No apparent anesthesia complications

## 2014-02-27 NOTE — Anesthesia Procedure Notes (Signed)
Procedure Name: Intubation Date/Time: 02/27/2014 7:40 AM Performed by: Andree Elk, AMY A Pre-anesthesia Checklist: Patient identified, Patient being monitored, Timeout performed, Emergency Drugs available and Suction available Patient Re-evaluated:Patient Re-evaluated prior to inductionOxygen Delivery Method: Circle System Utilized Preoxygenation: Pre-oxygenation with 100% oxygen Intubation Type: IV induction, Rapid sequence and Cricoid Pressure applied Laryngoscope Size: 3 and Miller Grade View: Grade I Tube type: Oral Tube size: 7.0 mm Number of attempts: 1 Airway Equipment and Method: stylet Placement Confirmation: ETT inserted through vocal cords under direct vision,  positive ETCO2 and breath sounds checked- equal and bilateral Secured at: 21 cm Tube secured with: Tape Dental Injury: Teeth and Oropharynx as per pre-operative assessment

## 2014-02-27 NOTE — H&P (Signed)
Patty Estrada is an 63 y.o. female. She is here for hysteroscopy, Dilation and currettage with removal of an endometrial polyp that was identified on gyn exam for postmenopausal bleeding. The polyp could not be removed in the office.   Pertinent Gynecological History: Menses: post-menopausal Bleeding: post menopausal bleeding and polyp visible at cervical os at last office visit. Contraception: post menopausal status DES exposure: unknown Blood transfusions: none Sexually transmitted diseases: no past history Previous GYN Procedures:   Last mammogram: none on EPIC records Date:  Last pap: needs pap, was bleeding at time of last eval. Date:  OB History: G, P   Menstrual History: Menarche age:  No LMP recorded. Patient is postmenopausal.    Past Medical History  Diagnosis Date  . HTN (hypertension)   . Obesity   . GERD (gastroesophageal reflux disease)   . Hyperlipidemia   . Arthritis   . Adenomatous colon polyp 2006       . Depression   . Fatty liver     ?cirrhosis on u/s in 2008  . Vitamin D deficiency   . Atrial fibrillation 2013    Past Surgical History  Procedure Laterality Date  . Tubal ligation    . Colonoscopy  04/09/2005    adenomatous polyps, two diverticula  . Esophagogastroduodenoscopy  2006    hyperplastic polyps, hh  . Esophagogastroduodenoscopy  12/2010    hh, multiple benign gastric polyps,   . Colonoscopy  12/2010    left-sided diverticula, adenomatous polyps, next TCS due 12/2015    Family History  Problem Relation Age of Onset  . Breast cancer Mother 42  . Heart attack Father 46  . Colon cancer Neg Hx   . Liver disease Neg Hx   . Heart disease Brother   . Heart attack Brother     Social History:  reports that she quit smoking about 29 years ago. Her smoking use included Cigarettes. She has a 26 pack-year smoking history. She quit smokeless tobacco use about 30 years ago. She reports that she does not drink alcohol or use illicit  drugs.  Allergies:  Allergies  Allergen Reactions  . Gabapentin     REACTION: confusion  . Nitrofurantoin     REACTION: Hives    Prescriptions prior to admission  Medication Sig Dispense Refill  . acetaminophen (TYLENOL) 650 MG CR tablet Take 1,300 mg by mouth daily as needed for pain.      Marland Kitchen aspirin EC 81 MG tablet Take 81 mg by mouth daily.      Marland Kitchen atenolol (TENORMIN) 50 MG tablet Take 50 mg by mouth daily.        Marland Kitchen atorvastatin (LIPITOR) 40 MG tablet Take 40 mg by mouth daily.        . benazepril (LOTENSIN) 40 MG tablet Take 40 mg by mouth daily.        . furosemide (LASIX) 40 MG tablet Take 20 mg by mouth daily.       . hydrocortisone (PROCTOZONE-HC) 2.5 % rectal cream Place rectally 2 (two) times daily.  30 g  1  . methocarbamol (ROBAXIN) 500 MG tablet Take 500 mg by mouth daily.       . pantoprazole (PROTONIX) 40 MG tablet Take 1 tablet (40 mg total) by mouth 2 (two) times daily.  60 tablet  11  . polyethylene glycol powder (GLYCOLAX/MIRALAX) powder One capful at bedtime as needed for constipation.  527 g  11  . potassium chloride SA (K-DUR,KLOR-CON) 20 MEQ tablet Take  10 mEq by mouth daily.       . Probiotic Product (ALIGN) 4 MG CAPS Take 4 mg by mouth daily.  30 capsule  0  . sertraline (ZOLOFT) 50 MG tablet Take 50 mg by mouth daily.          ROS  Blood pressure 129/56, pulse 81, temperature 98.3 F (36.8 C), temperature source Oral, resp. rate 18, height 5' 6"  (1.676 m), weight 308 lb (139.708 kg), SpO2 96.00%. Physical Exam  Constitutional: She is oriented to person, place, and time. She appears well-developed and well-nourished.  Obese with large pannus.  HENT:  Head: Normocephalic and atraumatic.  Eyes: Pupils are equal, round, and reactive to light.  Neck: No thyromegaly present.  Cardiovascular: Normal rate.   Respiratory: Effort normal.  GI: Soft. Bowel sounds are normal. She exhibits no distension and no mass. There is no tenderness. There is no rebound.   Exam limited by body habitus.  Genitourinary: Vagina normal.  EFG normal, Vagina normla support Cervix smooth with polyp visible in os, Efforts to grasp and remove unsuccessful . Polyp extends beyond visible portion of cervix, seems elongate, and will require access via hysteroscopy to completely remove.  Musculoskeletal: Normal range of motion.  Neurological: She is alert and oriented to person, place, and time.  Skin: Skin is warm and dry.  Psychiatric: She has a normal mood and affect. Her behavior is normal. Judgment and thought content normal.   EKG: normal sinus rythmn CBC    Component Value Date/Time   WBC 6.6 02/21/2014 1355   RBC 4.64 02/21/2014 1355   HGB 14.5 02/21/2014 1355   HCT 42.7 02/21/2014 1355   PLT 229 02/21/2014 1355   MCV 92.0 02/21/2014 1355   MCH 31.3 02/21/2014 1355   MCHC 34.0 02/21/2014 1355   RDW 13.0 02/21/2014 1355   LYMPHSABS 2.2 08/26/2011 1155   MONOABS 0.6 08/26/2011 1155   EOSABS 0.2 08/26/2011 1155   BASOSABS 0.0 08/26/2011 1155    BMET    Component Value Date/Time   NA 140 02/21/2014 1355   K 3.9 02/21/2014 1355   CL 100 02/21/2014 1355   CO2 25 02/21/2014 1355   GLUCOSE 140* 02/21/2014 1355   BUN 13 02/21/2014 1355   BUN 19 04/20/2011 1443   CREATININE 0.64 02/21/2014 1355   CREATININE 0.59 02/20/2014 1115   CALCIUM 9.6 02/21/2014 1355   CALCIUM 9.9 04/20/2011 1443   GFRNONAA >90 02/21/2014 1355   GFRAA >90 02/21/2014 1355      .   Assessment/Plan: 1. Postmenopausal bleeding due to elongate endometrial polyp 2. Morbid obesity  Plan:HYsteroscopy, dilation and curettage, removal of endometrial polyp.   Jone Panebianco V 02/27/2014, 6:59 AM

## 2014-02-27 NOTE — Brief Op Note (Signed)
02/27/2014  9:02 AM  PATIENT:  Patty Estrada  63 y.o. female  PRE-OPERATIVE DIAGNOSIS:  endometrial polyp  POST-OPERATIVE DIAGNOSIS:  endometrial polyp  PROCEDURE:  Procedure(s): REMOVAL OF ENDOCERVICAL POLYP AND ENDOMETRIAL POLYPS (N/A) DILATATION AND CURETTAGE /HYSTEROSCOPY (N/A)  SURGEON:  Surgeon(s) and Role:    * Jonnie Kind, MD - Primary  PHYSICIAN ASSISTANT:   ASSISTANTS: none   ANESTHESIA:   general  EBL:  Total I/O In: 1200 [I.V.:1200] Out: 5 [Blood:5]  BLOOD ADMINISTERED:none  DRAINS: none   LOCAL MEDICATIONS USED:  NONE  SPECIMEN:  Source of Specimen:  endometrial polyps, endocervical polyp  DISPOSITION OF SPECIMEN:  PATHOLOGY  COUNTS:  YES  TOURNIQUET:  * No tourniquets in log *  DICTATION: .Dragon Dictation  PLAN OF CARE: Discharge to home after PACU  PATIENT DISPOSITION:  PACU - hemodynamically stable.   Delay start of Pharmacological VTE agent (>24hrs) due to surgical blood loss or risk of bleeding: not applicable  Details of procedure: Patient was taken operating room prepped and draped for vaginal procedure with legs in low lithotomy yellowfin support, with perineum prepped and draped. The abdomen was taped and elevated to allow for improved pelvic access. Timeout was conducted and procedure confirmed by surgical team Speculum could be inserted, the cervix visualized. A large exposed polyp tip was visualized. The uterus is sounded to 8 cm, dilated to 25 [place] allowing introduction of the rigid 30 hysteroscope, with visualization of the endometrial cavity which showed a couple small polyp could then resected using the operative hysteroscopic biopsy scissors. Specimens were extracted and the endometrial cavity was smooth without other abnormalities. Attention was then directed to the endocervical canal or the space to the large polyp that was visible was able to be identified. The base was amputated with the hysteroscopic scissors and the  specimen then extracted and the removal having been finished with Metzenbaum scissors. Hemostasis was satisfactory and patient went to recovery in stable condition. Followed by pathology report will be conducted by phone

## 2014-02-27 NOTE — Anesthesia Preprocedure Evaluation (Signed)
Anesthesia Evaluation  Patient identified by MRN, date of birth, ID band Patient awake    Reviewed: Allergy & Precautions, H&P , NPO status , Patient's Chart, lab work & pertinent test results  Airway Mallampati: II TM Distance: >3 FB     Dental  (+) Loose, Teeth Intact,    Pulmonary former smoker,  breath sounds clear to auscultation        Cardiovascular hypertension, Pt. on medications and Pt. on home beta blockers + dysrhythmias Atrial Fibrillation Rhythm:Regular Rate:Normal     Neuro/Psych PSYCHIATRIC DISORDERS Depression    GI/Hepatic GERD-  Medicated,  Endo/Other  Morbid obesity  Renal/GU      Musculoskeletal   Abdominal   Peds  Hematology   Anesthesia Other Findings   Reproductive/Obstetrics                           Anesthesia Physical Anesthesia Plan  ASA: III  Anesthesia Plan: General   Post-op Pain Management:    Induction: Intravenous, Rapid sequence and Cricoid pressure planned  Airway Management Planned: Oral ETT  Additional Equipment:   Intra-op Plan:   Post-operative Plan: Extubation in OR  Informed Consent: I have reviewed the patients History and Physical, chart, labs and discussed the procedure including the risks, benefits and alternatives for the proposed anesthesia with the patient or authorized representative who has indicated his/her understanding and acceptance.     Plan Discussed with:   Anesthesia Plan Comments:         Anesthesia Quick Evaluation

## 2014-02-27 NOTE — Transfer of Care (Signed)
Immediate Anesthesia Transfer of Care Note  Patient: Patty Estrada  Procedure(s) Performed: Procedure(s): REMOVAL OF ENDOCERVICAL POLYP AND ENDOMETRIAL POLYPS (N/A) DILATATION AND CURETTAGE /HYSTEROSCOPY (N/A)  Patient Location: PACU  Anesthesia Type:General  Level of Consciousness: awake, alert , oriented and patient cooperative  Airway & Oxygen Therapy: Patient Spontanous Breathing and Patient connected to face mask oxygen  Post-op Assessment: Report given to PACU RN and Post -op Vital signs reviewed and stable  Post vital signs: Reviewed and stable  Complications: No apparent anesthesia complications

## 2014-02-28 ENCOUNTER — Encounter (HOSPITAL_COMMUNITY): Payer: Self-pay | Admitting: Obstetrics and Gynecology

## 2014-03-06 ENCOUNTER — Ambulatory Visit (INDEPENDENT_AMBULATORY_CARE_PROVIDER_SITE_OTHER): Payer: Medicare Other | Admitting: Obstetrics and Gynecology

## 2014-03-06 ENCOUNTER — Encounter: Payer: Self-pay | Admitting: Obstetrics and Gynecology

## 2014-03-06 VITALS — BP 130/80 | Ht 65.0 in | Wt 306.0 lb

## 2014-03-06 DIAGNOSIS — Z9889 Other specified postprocedural states: Secondary | ICD-10-CM

## 2014-03-06 NOTE — Progress Notes (Signed)
Patient ID: Patty Estrada, female   DOB: May 01, 1951, 63 y.o.   MRN: [id number] Pt here today for post op visit. Pt had surgery on 02/27/14. Pt denies any problems or concerns at this time.  Path : BENIGN endocervix and endomerrial polyp Ros - A normal exam s.p hysteroscopy Plan : weight loss encouraged.

## 2014-03-14 NOTE — Progress Notes (Signed)
LFTS noted. Normal.  She is overdue for abd u/s for hepatoma surveillance. Looks like she never had done after last OV.   Recommend her complete abd u/s. OV with Dr. Gala Romney in 3 months.

## 2014-03-15 NOTE — Progress Notes (Signed)
[name], please nic ov Leighann, please schedule U/S.

## 2014-03-16 NOTE — Progress Notes (Signed)
PATIENT NIC'D

## 2014-03-16 NOTE — Progress Notes (Signed)
LMOM For patient to return call

## 2014-03-19 ENCOUNTER — Encounter: Payer: Self-pay | Admitting: Gastroenterology

## 2014-03-19 NOTE — Progress Notes (Signed)
Letter has been mailed to patient

## 2014-03-20 ENCOUNTER — Other Ambulatory Visit: Payer: Self-pay

## 2014-03-20 NOTE — Progress Notes (Signed)
Patient ID: Patty Estrada, female   DOB: 1951/01/18, 63 y.o.   MRN: [id number] Pt is set up for her [place] on 03/28/14 @11 :00. She is aware of appointment and time.

## 2014-03-28 ENCOUNTER — Ambulatory Visit (HOSPITAL_COMMUNITY)
Admission: RE | Admit: 2014-03-28 | Discharge: 2014-03-28 | Disposition: A | Discharge status: Home / Self Care | Payer: Medicare Other | Source: Ambulatory Visit | Attending: Gastroenterology | Admitting: Gastroenterology

## 2014-03-28 DIAGNOSIS — K649 Unspecified hemorrhoids: Secondary | ICD-10-CM | POA: Diagnosis not present

## 2014-03-28 DIAGNOSIS — K219 Gastro-esophageal reflux disease without esophagitis: Secondary | ICD-10-CM

## 2014-03-28 DIAGNOSIS — K7460 Unspecified cirrhosis of liver: Secondary | ICD-10-CM | POA: Diagnosis not present

## 2014-03-28 DIAGNOSIS — J019 Acute sinusitis, unspecified: Secondary | ICD-10-CM | POA: Diagnosis not present

## 2014-03-28 DIAGNOSIS — K5900 Constipation, unspecified: Secondary | ICD-10-CM

## 2014-03-28 NOTE — Progress Notes (Signed)
Quick Note:  No evidence of hepatoma.  OV with RMR only in 05/2014. ______

## 2014-04-05 ENCOUNTER — Encounter: Payer: Self-pay | Admitting: Internal Medicine

## 2014-04-05 NOTE — Progress Notes (Signed)
APPT MADE AND LETTER SENT  °

## 2014-05-03 ENCOUNTER — Encounter: Payer: Self-pay | Admitting: Internal Medicine

## 2014-05-08 ENCOUNTER — Ambulatory Visit: Payer: Medicare Other | Admitting: Internal Medicine

## 2014-06-06 ENCOUNTER — Ambulatory Visit: Payer: Medicare Other | Admitting: Gastroenterology

## 2014-06-11 ENCOUNTER — Encounter: Payer: Self-pay | Admitting: Gastroenterology

## 2014-06-11 ENCOUNTER — Ambulatory Visit (INDEPENDENT_AMBULATORY_CARE_PROVIDER_SITE_OTHER): Payer: Medicare Other | Admitting: Gastroenterology

## 2014-06-11 VITALS — BP 128/71 | HR 82 | Temp 98.0°F | Ht 66.0 in | Wt 312.6 lb

## 2014-06-11 DIAGNOSIS — K7469 Other cirrhosis of liver: Secondary | ICD-10-CM

## 2014-06-11 DIAGNOSIS — K7581 Nonalcoholic steatohepatitis (NASH): Secondary | ICD-10-CM

## 2014-06-11 DIAGNOSIS — K219 Gastro-esophageal reflux disease without esophagitis: Secondary | ICD-10-CM

## 2014-06-11 DIAGNOSIS — K5909 Other constipation: Secondary | ICD-10-CM

## 2014-06-11 DIAGNOSIS — K7460 Unspecified cirrhosis of liver: Secondary | ICD-10-CM | POA: Insufficient documentation

## 2014-06-11 NOTE — Patient Instructions (Signed)
1. I will discuss your next imaging with Dr. Gala Romney, ie CT scan versus special ultrasound. You will be due in March. If you decide you want to pursue CT sooner for pain in left upper abdomen, please call me and we will make arrangement.  2. We will send reminder for labs in the spring. 3. Try to increase your daily activity. 4. Have a wonderful Christmas and [place]!

## 2014-06-11 NOTE — Progress Notes (Signed)
Primary Care Physician: Asencion Noble, MD  Primary Gastroenterologist:  Garfield Cornea, MD   Chief Complaint  Patient presents with  . Follow-up    HPI: Patty Estrada is a 63 y.o. female here for follow-up of Patty Estrada cirrhosis and GERD. She was last seen in May of this year. Last EGD June 2012 demonstrated fundal gland polyp, no stigmata of advanced chronic liver disease such as varices or portal gastropathy. She is due for surveillance colonoscopy in June 2017 per history of adenomatous colon polyp at time of colonoscopy in June 2012. She had abdominal ultrasound in September for hepatoma screening. Findings consistent with fatty liver. Prior ultrasound in 2012 there was evidence of cirrhosis.  Constipation is well-managed with miralax if she remembers to take it. LUQ pain intermittently, dull ache, seems to be improved if constipation is not an issues.  Heartburn okay on PPI. Sometimes takes only once per day but cannot go back to only once per day because she will get flare up of symptoms. No melena, brbpr.    If could get egd with tcs that would be best for her.     Current Outpatient Prescriptions  Medication Sig Dispense Refill  . acetaminophen (TYLENOL) 650 MG CR tablet Take 1,300 mg by mouth daily as needed for pain.    Marland Kitchen aspirin EC 81 MG tablet Take 81 mg by mouth daily.    Marland Kitchen atenolol (TENORMIN) 50 MG tablet Take 50 mg by mouth daily.      Marland Kitchen atorvastatin (LIPITOR) 40 MG tablet Take 40 mg by mouth daily.      . benazepril (LOTENSIN) 40 MG tablet Take 40 mg by mouth daily.      . furosemide (LASIX) 40 MG tablet Take 20 mg by mouth daily.     . hydrocortisone (PROCTOZONE-HC) 2.5 % rectal cream Place rectally 2 (two) times daily. 30 g 1  . methocarbamol (ROBAXIN) 500 MG tablet Take 500 mg by mouth daily.     . pantoprazole (PROTONIX) 40 MG tablet Take 1 tablet (40 mg total) by mouth 2 (two) times daily. 60 tablet 11  . polyethylene glycol powder (GLYCOLAX/MIRALAX) powder One  capful at bedtime as needed for constipation. 527 g 11  . potassium chloride SA (K-DUR,KLOR-CON) 20 MEQ tablet Take 10 mEq by mouth daily.     . Probiotic Product (ALIGN) 4 MG CAPS Take 4 mg by mouth daily. 30 capsule 0  . sertraline (ZOLOFT) 50 MG tablet Take 50 mg by mouth daily.       No current facility-administered medications for this visit.    Allergies as of 06/11/2014 - Review Complete 06/11/2014  Allergen Reaction Noted  . Gabapentin    . Nitrofurantoin      ROS:  General: Negative for anorexia, weight loss, fever, chills, fatigue, weakness. ENT: Negative for hoarseness, difficulty swallowing , nasal congestion. CV: Negative for chest pain, angina, palpitations, dyspnea on exertion, peripheral edema.  Respiratory: Negative for dyspnea at rest, dyspnea on exertion, cough, sputum, wheezing.  GI: See history of present illness. GU:  Negative for dysuria, hematuria, urinary incontinence, urinary frequency, nocturnal urination.  Endo: Negative for unusual weight change.    Physical Examination:   BP 128/71 mmHg  Pulse 82  Temp(Src) 98 F (36.7 C)  Ht 5' 6"  (1.676 m)  Wt 312 lb 9.6 oz (141.794 kg)  BMI 50.48 kg/m2  General: Well-nourished, well-developed in no acute distress.  Eyes: No icterus. Mouth: Oropharyngeal mucosa moist and pink , no  lesions erythema or exudate. Lungs: Clear to auscultation bilaterally.  Heart: Regular rate and rhythm, no murmurs rubs or gallops.  Abdomen: Bowel sounds are normal, nontender, nondistended, no hepatosplenomegaly or masses, no abdominal bruits or hernia , no rebound or guarding.   Extremities: No lower extremity edema. No clubbing or deformities. Neuro: Alert and oriented x 4   Skin: Warm and dry, no jaundice.   Psych: Alert and cooperative, normal mood and affect.  Labs:  Lab Results  Component Value Date   WBC 6.6 02/21/2014   HGB 14.5 02/21/2014   HCT 42.7 02/21/2014   MCV 92.0 02/21/2014   PLT 229 02/21/2014   Lab  Results  Component Value Date   CREATININE 0.64 02/21/2014   BUN 13 02/21/2014   NA 140 02/21/2014   K 3.9 02/21/2014   CL 100 02/21/2014   CO2 25 02/21/2014   Lab Results  Component Value Date   ALT 16 02/20/2014   AST 12 02/20/2014   ALKPHOS 84 02/20/2014   BILITOT 0.8 02/20/2014    Imaging Studies: No results found.

## 2014-06-13 NOTE — Assessment & Plan Note (Signed)
63 y/o female with h/o NASH and likely cirrhosis per previous u/s who presents for f/u. Labs in 02/2014 were normal. Last EGD 12/2010 with no varices. Due for TCS 12/2015. Patient is up to date on abd u/s. Surveillance.   Consider baseline elastography if prior studies not clear on degree of liver disease. Or consider CT. Patient has never had one to her knowledge and given her body habitus the elastography may be difficult. To address with Dr. Gala Romney.   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 She would like to postpone next EGD until time of her TCS if possible.   It is not clear if she ever completed her Hep A/B vaccines.

## 2014-06-13 NOTE — Progress Notes (Signed)
cc'ed to pcp °

## 2014-06-13 NOTE — Progress Notes (Signed)
Patty Estrada, please find out if patient ever completed hep a/b vaccines. Thanks.

## 2014-08-09 NOTE — Progress Notes (Signed)
Please let patient know with regards to evaluating extent of liver scarring the following is recommended.  -abd u/s for hepatoma surveillance in April or May 2016. -Fibrosure test, blood test.  -return OV with RMR only, 01/2015.

## 2014-08-28 NOTE — Progress Notes (Signed)
On recall for appt and [place]

## 2014-08-28 NOTE — Progress Notes (Signed)
Lab requested verified with [facility]. Ordered the promethius lab fibrospec. Letter done and lab order mailed to the pt.   Please nic- U/S and ov with RMR

## 2014-09-05 ENCOUNTER — Other Ambulatory Visit: Payer: Self-pay | Admitting: Gastroenterology

## 2014-09-06 LAB — PROMETHEUS-MAIL

## 2014-09-21 NOTE — Progress Notes (Signed)
Quick Note:  Please let patient know that her Fibrospect test showed low grade fibrosis only.  At this point, I would advise that we can hold off on further liver imaging until her OV with RMR only in 01/2015.  It appears that she has minimal fibrosis and may not require as frequent u/s as initially thought. We can let RMR give his input at time of OV.    ______

## 2014-09-25 NOTE — Progress Notes (Signed)
ON RECALL FOR RMR OV 01/2015

## 2014-10-04 ENCOUNTER — Encounter: Payer: Self-pay | Admitting: Gastroenterology

## 2014-12-18 ENCOUNTER — Encounter: Payer: Self-pay | Admitting: Internal Medicine

## 2014-12-21 ENCOUNTER — Other Ambulatory Visit: Payer: Self-pay | Admitting: Gastroenterology

## 2015-01-11 ENCOUNTER — Other Ambulatory Visit: Payer: Self-pay | Admitting: Gastroenterology

## 2015-01-15 ENCOUNTER — Ambulatory Visit: Payer: Self-pay | Admitting: Internal Medicine

## 2015-02-12 ENCOUNTER — Ambulatory Visit (INDEPENDENT_AMBULATORY_CARE_PROVIDER_SITE_OTHER): Payer: Medicare Other | Admitting: Internal Medicine

## 2015-02-12 ENCOUNTER — Encounter: Payer: Self-pay | Admitting: Internal Medicine

## 2015-02-12 VITALS — BP 121/69 | HR 70 | Temp 97.8°F | Ht 64.0 in | Wt 316.2 lb

## 2015-02-12 DIAGNOSIS — K760 Fatty (change of) liver, not elsewhere classified: Secondary | ICD-10-CM

## 2015-02-12 DIAGNOSIS — D126 Benign neoplasm of colon, unspecified: Secondary | ICD-10-CM | POA: Diagnosis not present

## 2015-02-12 DIAGNOSIS — K219 Gastro-esophageal reflux disease without esophagitis: Secondary | ICD-10-CM

## 2015-02-12 NOTE — Patient Instructions (Signed)
GERD and constipation information  Take Miralax daily  Continue Protonix 40 mg daily  Weight loss / regular exercise encouraged  Would re-consider having bariatric surgery  Daily coffee intake will help your fatty liver  Office visit in 1 year to set up a colonoscopy  Repeat fibrosure test in 3 years

## 2015-02-12 NOTE — Progress Notes (Signed)
Primary Care Physician:  Asencion Noble, MD Primary Gastroenterologist:  Dr. Gala Romney  Pre-Procedure History & Physical: HPI:  Patty Estrada is a 64 y.o. female here for fatty liver, GERD, colonic polyps and constipation. Clinically, doing well from a GI standpoint. Left upper quadrant abdominal pain temporally related to constipation. So long as she takes her MiraLax, she has no abdominal pain. Reflux symptoms well controlled on PPI.  Recent FibroSure F0-1, which correlates to minimal to no hepatic fibrosis. Liver enzymes have been repeatedly normal. Last set reviewed from March 13 via Dr. Ria Comment office (AST 13, ALT 16, alkaline phosphatase 81. Total bilirubin 0.7).    Patient was seen at [facility] about 7 years ago for consideration of bariatric surgery. She elected not to have it done at that time.  Past Medical History  Diagnosis Date  . HTN (hypertension)   . Obesity   . GERD (gastroesophageal reflux disease)   . Hyperlipidemia   . Arthritis   . Adenomatous colon polyp 2006       . Depression   . Fatty liver     ?cirrhosis on u/s in 2008  . Vitamin D deficiency   . Atrial fibrillation 2013  . NASH (nonalcoholic steatohepatitis)     Past Surgical History  Procedure Laterality Date  . Tubal ligation    . Colonoscopy  04/09/2005    adenomatous polyps, two diverticula  . Esophagogastroduodenoscopy  2006    hyperplastic polyps, hh  . Esophagogastroduodenoscopy  12/2010    RMR:hh, multiple benign gastric polyps,   . Colonoscopy  12/2010    RMR: left-sided diverticula, adenomatous polyps, next TCS due 12/2015  . Polypectomy N/A 02/27/2014    Procedure: REMOVAL OF ENDOCERVICAL POLYP AND ENDOMETRIAL POLYPS;  Surgeon: Jonnie Kind, MD;  Location: [facility];  Service: Gynecology;  Laterality: N/A;  . Hysteroscopy  02/27/2014    Procedure: HYSTEROSCOPY;  Surgeon: Jonnie Kind, MD;  Location: [facility];  Service: Gynecology;;    Prior to Admission medications   Medication Sig Start  Date End Date Taking? Authorizing Provider  acetaminophen (TYLENOL) 650 MG CR tablet Take 1,300 mg by mouth daily as needed for pain.   Yes Historical Provider, MD  atenolol (TENORMIN) 50 MG tablet Take 50 mg by mouth daily.     Yes Historical Provider, MD  atorvastatin (LIPITOR) 40 MG tablet Take 40 mg by mouth daily.     Yes Historical Provider, MD  benazepril (LOTENSIN) 40 MG tablet Take 40 mg by mouth daily.     Yes Historical Provider, MD  furosemide (LASIX) 40 MG tablet Take 20 mg by mouth daily. Taking one or two tablets weekly   Yes Historical Provider, MD  ibuprofen (ADVIL,MOTRIN) 200 MG tablet Take 200 mg by mouth every 6 (six) hours as needed. Takes one to 2 tablets daily   Yes Historical Provider, MD  methocarbamol (ROBAXIN) 500 MG tablet Take 500 mg by mouth daily.  10/26/13  Yes Historical Provider, MD  pantoprazole (PROTONIX) 40 MG tablet TAKE (1) TABLET TWICE DAILY. 01/14/15  Yes Mahala Menghini, PA-C  polyethylene glycol powder (GLYCOLAX/MIRALAX) powder MIX 1 CAPFUL IN LIQUID AT BEDTIME AS NEEDED FOR CONSTIPATION. 12/24/14  Yes Carlis Stable, NP  potassium chloride SA (K-DUR,KLOR-CON) 20 MEQ tablet Take 10 mEq by mouth daily. Takes one or two tablets weekly 09/25/10  Yes Historical Provider, MD  Probiotic Product (ALIGN) 4 MG CAPS Take 4 mg by mouth daily. 09/30/10  Yes Mahala Menghini, PA-C  sertraline (  ZOLOFT) 50 MG tablet Take 50 mg by mouth daily. Takes either 49m or 100 mg daily   Yes Historical Provider, MD  traMADol (ULTRAM) 50 MG tablet Take by mouth every 6 (six) hours as needed. One daily as needed   Yes Historical Provider, MD  aspirin EC 81 MG tablet Take 81 mg by mouth daily.    Historical Provider, MD  hydrocortisone (PROCTOZONE-HC) 2.5 % rectal cream Place rectally 2 (two) times daily. Patient not taking: Reported on 02/12/2015 11/22/13   LMahala Menghini PA-C    Allergies as of 02/12/2015 - Review Complete 02/12/2015  Allergen Reaction Noted  . Gabapentin    .  Nitrofurantoin      Family History  Problem Relation Age of Onset  . Breast cancer Mother 45 . Heart attack Father 523 . Colon cancer Neg Hx   . Liver disease Neg Hx   . Heart disease Brother   . Heart attack Brother     History   Social History  . Marital Status: Married    Spouse Name: N/A  . Number of Children: 2  . Years of Education: N/A   Occupational History  . Not on file.   Social History Main Topics  . Smoking status: Former Smoker -- 2.00 packs/day for 13 years    Types: Cigarettes    Quit date: 07/06/1984  . Smokeless tobacco: Former [employer] developer   Quit date: 02/22/1984     Comment: Quit since 1986  . Alcohol Use: No     Comment: maybe one or two drinks a year  . Drug Use: No  . Sexual Activity: Not Currently    Birth Control/ Protection: Surgical, Post-menopausal   Other Topics Concern  . 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    Review of Systems: See HPI, otherwise negative ROS  Physical Exam: BP 121/69 mmHg  Pulse 70  Temp(Src) 97.8 F (36.6 C) (Oral)  Ht 5' 4"  (1.626 m)  Wt 316 lb 3.2 oz (143.427 kg)  BMI 54.25 kg/m2 General:   Alert,  Well-developed, morbidly obese, pleasant and cooperative in NAD. Ambulates with a cane Skin:  Intact without significant lesions or rashes. Eyes:  Sclera clear, no icterus.   Conjunctiva pink. Ears:  Normal auditory acuity. Nose:  No deformity, discharge,  or lesions. Mouth:  No deformity or lesions. Neck:  Supple; no masses or thyromegaly. No significant cervical adenopathy. Lungs:  Clear throughout to auscultation.   No wheezes, crackles, or rhonchi. No acute distress. Heart:  Regular rate and rhythm; no murmurs, clicks, rubs,  or gallops. Abdomen: Non-distended, normal bowel sounds.  Soft and nontender without appreciable mass or hepatosplenomegaly.  Pulses:  Normal pulses noted. Extremities:  Without clubbing or edema.  Impression:  Pleasant morbidly obese lady  with fatty-appearing liver on imaging. She has repeatedly normal aminotransferases. Metavir score low - indicating minimal to no hepatic fibrosis. She likely has a fatty liver more likely bland steatosis. There is no indication of more advanced chronic liver disease. I do not feel that she has cirrhosis at this time. Moreover, I do not feel she needs to be  Enrolled in cirrhosis care currently. I told her that although things look good at this point in time, it is possible that fatty infiltration in her liver may cause damage over time.  She has multiple health issues directly and indirectly related to morbid obesity. I had a lengthy discussion with her  today regarding this issue. I recommended she reconsider having bariatric surgery.  GERD symptoms well controlled on PPI. Constipation well managed on MiraLax. Surveillance colonoscopy due to 2017.  Recommendations:   GERD and constipation information  Take Miralax daily  Continue Protonix 40 mg daily  Weight loss / regular exercise encouraged  Would re-consider having bariatric surgery  Daily coffee intake will help your fatty liver  Office visit in 1 year to set up a colonoscopy  Repeat fibrosure test in 3 years      Notice: This dictation was prepared with Dragon dictation along with smaller phrase technology. Any transcriptional errors that result from this process are unintentional and may not be corrected upon review.

## 2015-09-24 DIAGNOSIS — F329 Major depressive disorder, single episode, unspecified: Secondary | ICD-10-CM | POA: Diagnosis not present

## 2015-09-24 DIAGNOSIS — Z79899 Other long term (current) drug therapy: Secondary | ICD-10-CM | POA: Diagnosis not present

## 2015-09-24 DIAGNOSIS — E559 Vitamin D deficiency, unspecified: Secondary | ICD-10-CM | POA: Diagnosis not present

## 2015-09-24 DIAGNOSIS — E785 Hyperlipidemia, unspecified: Secondary | ICD-10-CM | POA: Diagnosis not present

## 2015-09-24 DIAGNOSIS — E119 Type 2 diabetes mellitus without complications: Secondary | ICD-10-CM | POA: Diagnosis not present

## 2015-09-30 DIAGNOSIS — M1990 Unspecified osteoarthritis, unspecified site: Secondary | ICD-10-CM | POA: Diagnosis not present

## 2015-09-30 DIAGNOSIS — E119 Type 2 diabetes mellitus without complications: Secondary | ICD-10-CM | POA: Diagnosis not present

## 2015-09-30 DIAGNOSIS — E785 Hyperlipidemia, unspecified: Secondary | ICD-10-CM | POA: Diagnosis not present

## 2015-10-09 ENCOUNTER — Other Ambulatory Visit: Payer: Self-pay | Admitting: Gastroenterology

## 2016-01-13 ENCOUNTER — Encounter: Payer: Self-pay | Admitting: Internal Medicine

## 2016-01-24 DIAGNOSIS — E119 Type 2 diabetes mellitus without complications: Secondary | ICD-10-CM | POA: Diagnosis not present

## 2016-01-30 ENCOUNTER — Other Ambulatory Visit: Payer: Self-pay | Admitting: Gastroenterology

## 2016-01-31 DIAGNOSIS — I10 Essential (primary) hypertension: Secondary | ICD-10-CM | POA: Diagnosis not present

## 2016-01-31 DIAGNOSIS — E119 Type 2 diabetes mellitus without complications: Secondary | ICD-10-CM | POA: Diagnosis not present

## 2016-02-26 ENCOUNTER — Ambulatory Visit (INDEPENDENT_AMBULATORY_CARE_PROVIDER_SITE_OTHER): Payer: Medicare Other | Admitting: Gastroenterology

## 2016-02-26 ENCOUNTER — Encounter: Payer: Self-pay | Admitting: Gastroenterology

## 2016-02-26 VITALS — BP 136/63 | HR 75 | Temp 98.0°F | Ht 64.0 in | Wt 326.4 lb

## 2016-02-26 DIAGNOSIS — Z86010 Personal history of colonic polyps: Secondary | ICD-10-CM

## 2016-02-26 DIAGNOSIS — K581 Irritable bowel syndrome with constipation: Secondary | ICD-10-CM

## 2016-02-26 DIAGNOSIS — K760 Fatty (change of) liver, not elsewhere classified: Secondary | ICD-10-CM

## 2016-02-26 DIAGNOSIS — K219 Gastro-esophageal reflux disease without esophagitis: Secondary | ICD-10-CM | POA: Diagnosis not present

## 2016-02-26 NOTE — Patient Instructions (Addendum)
1. You're due for a colonoscopy for history of colon polyps. Please call and schedule when you're ready. If that is not within the next 30 days, please let me know so I can discuss with Dr. Gala Romney whether or not you will need to come back for an office visit first.  2. Continue pantoprazole one to 2 daily as needed to control reflux symptoms. 3. Continue MiraLAX daily. 4. Continue dietary modifications for efforts at weight loss purposes. 5. We will see back in one year or sooner if needed.

## 2016-02-26 NOTE — Progress Notes (Signed)
Primary Care Physician: Asencion Noble, MD  Primary Gastroenterologist:  Garfield Cornea, MD   Chief Complaint  Patient presents with  . Follow-up    doing ok  . Colonoscopy    HPI: Patty Estrada is a 65 y.o. female here for follow-up of fatty liver, GERD, colon polyps, constipation. Last seen in August 2016. Patient was due for colonoscopy June 2017. She has a history of adenomatous colon polyps. She has F0/F1 score on previous [facility] Fibrospect test in March 2016. Plans for repeat in 3 years.  Overall patient is doing well. She rarely has any rectal bleeding. Notices if she has to strain. Overall having regular bowel movements if she takes MiraLAX every day. Heartburn well-controlled. She has been able to get down to once daily dosing on her pantoprazole for the most part. No dysphagia. Vague left upper quadrant abdominal discomfort, mild, intermittent. Seems to be better if she has a bowel movement. No melena. She is having a lot of issues with knee pain and has become less active. Her weight is up 10 pounds in the past year.   Current Outpatient Prescriptions  Medication Sig Dispense Refill  . acetaminophen (TYLENOL) 650 MG CR tablet Take 1,300 mg by mouth daily as needed for pain.    Marland Kitchen atenolol (TENORMIN) 50 MG tablet Take 50 mg by mouth daily.      Marland Kitchen atorvastatin (LIPITOR) 40 MG tablet Take 40 mg by mouth daily.      . benazepril (LOTENSIN) 40 MG tablet Take 40 mg by mouth daily.      . diclofenac sodium (VOLTAREN) 1 % GEL Apply 1 application topically as needed. Uses 1-2 times/day for arthritic knee pain  5  . ibuprofen (ADVIL,MOTRIN) 200 MG tablet Take 200 mg by mouth every 6 (six) hours as needed. Takes one to 2 tablets daily    . methocarbamol (ROBAXIN) 500 MG tablet Take 500 mg by mouth as needed.     . pantoprazole (PROTONIX) 40 MG tablet TAKE (1) TABLET TWICE DAILY. 60 tablet 5  . polyethylene glycol powder (GLYCOLAX/MIRALAX) powder MIX 1 CAPFUL IN LIQUID AT BEDTIME  AS NEEDED FOR CONSTIPATION. 527 g 3  . Probiotic Product (ALIGN) 4 MG CAPS Take 4 mg by mouth daily. 30 capsule 0  . sertraline (ZOLOFT) 50 MG tablet Take 50 mg by mouth daily. Takes either 84m or 100 mg daily    . sodium-potassium bicarbonate (ALKA-SELTZER GOLD) TBEF dissolvable tablet Take 2 tablets by mouth as needed.    . traMADol (ULTRAM) 50 MG tablet Take by mouth every 6 (six) hours as needed. One daily as needed    . potassium chloride SA (K-DUR,KLOR-CON) 20 MEQ tablet Take 10 mEq by mouth daily. Takes one or two tablets weekly     No current facility-administered medications for this visit.     Allergies as of 02/26/2016 - Review Complete 02/26/2016  Allergen Reaction Noted  . Gabapentin    . Nitrofurantoin     Past Medical History:  Diagnosis Date  . Adenomatous colon polyp 2006      . Arthritis   . [place]  . Depression   . Fatty liver    ?cirrhosis on u/s in 2008  . GERD (gastroesophageal reflux disease)   . HTN (hypertension)   . Hyperlipidemia   . NASH (nonalcoholic steatohepatitis)   . Obesity   . Vitamin D deficiency    Past Surgical History:  Procedure Laterality Date  . COLONOSCOPY  04/09/2005   adenomatous polyps, two diverticula  . COLONOSCOPY  12/2010   RMR: left-sided diverticula, adenomatous polyps, next TCS due 12/2015  . ESOPHAGOGASTRODUODENOSCOPY  2006   hyperplastic polyps, hh  . ESOPHAGOGASTRODUODENOSCOPY  12/2010   RMR:hh, multiple benign gastric polyps,   . HYSTEROSCOPY  02/27/2014   Procedure: HYSTEROSCOPY;  Surgeon: Jonnie Kind, MD;  Location: [facility];  Service: Gynecology;;  . POLYPECTOMY N/A 02/27/2014   Procedure: REMOVAL OF ENDOCERVICAL POLYP AND ENDOMETRIAL POLYPS;  Surgeon: Jonnie Kind, MD;  Location: [facility];  Service: Gynecology;  Laterality: N/A;  . TUBAL LIGATION     Family History  Problem Relation Age of Onset  . Breast cancer Mother 23  . Heart attack Father 74  . Colon cancer Neg Hx   . Liver  disease Neg Hx   . Heart disease Brother   . Heart attack Brother    Social History   Social History  . Marital status: Married    Spouse name: N/A  . Number of children: 2  . Years of education: N/A   Social History Main Topics  . Smoking status: Former Smoker    Packs/day: 2.00    Years: 13.00    Types: Cigarettes    Quit date: 07/06/1984  . Smokeless tobacco: Former Systems developer    Quit date: 02/22/1984     Comment: Quit since 1986  . Alcohol use No     Comment: maybe one or two drinks a year  . Drug use: No  . Sexual activity: Not Currently    Birth control/ protection: Surgical, Post-menopausal   Other Topics Concern  . 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    ROS:  General: Negative for anorexia, weight loss, fever, chills, fatigue, weakness. ENT: Negative for hoarseness, difficulty swallowing , nasal congestion. CV: Negative for chest pain, angina, palpitations, dyspnea on exertion, peripheral edema.  Respiratory: Negative for dyspnea at rest, dyspnea on exertion, cough, sputum, wheezing.  GI: See history of present illness. GU:  Negative for dysuria, hematuria, urinary incontinence, urinary frequency, nocturnal urination.  Endo: Negative for unusual weight change.    Physical Examination:   BP 136/63   Pulse 75   Temp 98 F (36.7 C) (Oral)   Ht 5' 4"  (1.626 m)   Wt (!) 326 lb 6.4 oz (148.1 kg)   BMI 56.03 kg/m   General: Well-nourished, well-developed in no acute distress.  Eyes: No icterus. Mouth: Oropharyngeal mucosa moist and pink , no lesions erythema or exudate. Lungs: Clear to auscultation bilaterally.  Heart: Regular rate and rhythm, no murmurs rubs or gallops.  Abdomen: Bowel sounds are normal, nontender, Obese, exam limited due to body habitus.    Extremities: No lower extremity edema. No clubbing or deformities. Neuro: Alert and oriented x 4   Skin: Warm and dry, no jaundice.   Psych: Alert and cooperative,  normal mood and affect.  Labs:  Labs from PCP dated March 2017 Glucose 1:15, BUN 14, creatinine 0.72, total bilirubin 0.6, alkaline phosphatase 106, AST 9, ALT 15, albumin 4.4, white blood cell count 8700, hemoglobin 15.1, hematocrit 43.9, platelets 272,000, hemoglobin A1c 5.8 Imaging Studies: No results found.

## 2016-02-26 NOTE — Assessment & Plan Note (Signed)
Due for surveillance colonoscopy at this time. Patient may have to postpone due to her husband's schedule. Request that she try to have a delta the next 30 days but if she cannot, will discuss with Dr. Gala Romney regarding if she will need to come back for another office visit. She would like to postpone until October but will try to have it done sooner if she can get a ride. I have discussed the risks, alternatives, benefits with regards to but not limited to the risk of reaction to medication, bleeding, infection, perforation and the patient is agreeable to proceed. Written consent to be obtained.

## 2016-02-26 NOTE — Assessment & Plan Note (Signed)
Well-controlled. Basically on once daily pantoprazole with intermittent twice a day dosing if she plans to eat trigger foods. Return to the office in one year or sooner if needed.

## 2016-02-26 NOTE — Assessment & Plan Note (Signed)
Doing well at this time. Utilizing MiraLAX daily for constipation with good results.

## 2016-02-26 NOTE — Assessment & Plan Note (Signed)
F0/F1 score on [facility] fibrospect testing last year. Repeat in 2 more years. LFTs remain normal. Ultrasound last year noted. No indication of cirrhosis at this time. Continue to encourage significant weight loss. Patient is not interested in pursuing gastric bypass.

## 2016-02-26 NOTE — Progress Notes (Signed)
cc'ed to pcp °

## 2016-02-28 IMAGING — US US ABDOMEN COMPLETE
1 series · 14 of 25 positions shown · non-contrast
Comparison: 08/23/2012 to

CLINICAL DATA: Cirrhosis.  Hepatoma surveillance.

EXAM:
ULTRASOUND ABDOMEN COMPLETE

[Series 1: us abdomen complete · 0.20mm/px · 14 of 123 slices shown]
[im 1/123]
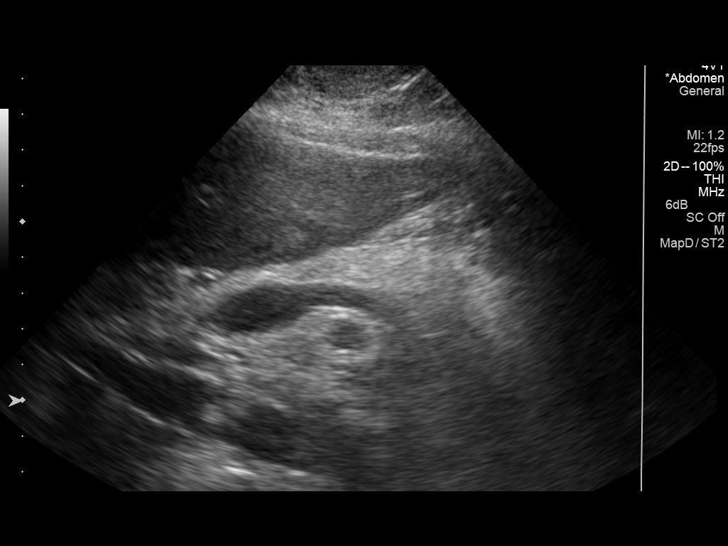
[im 11/123]
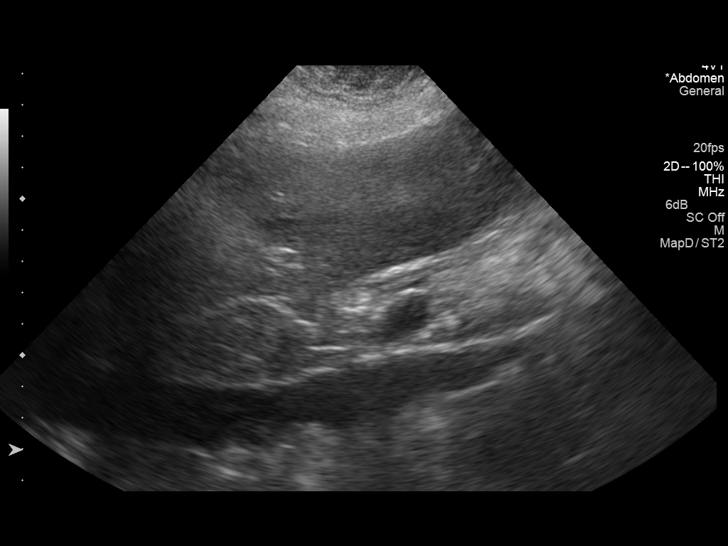
[im 21/123]
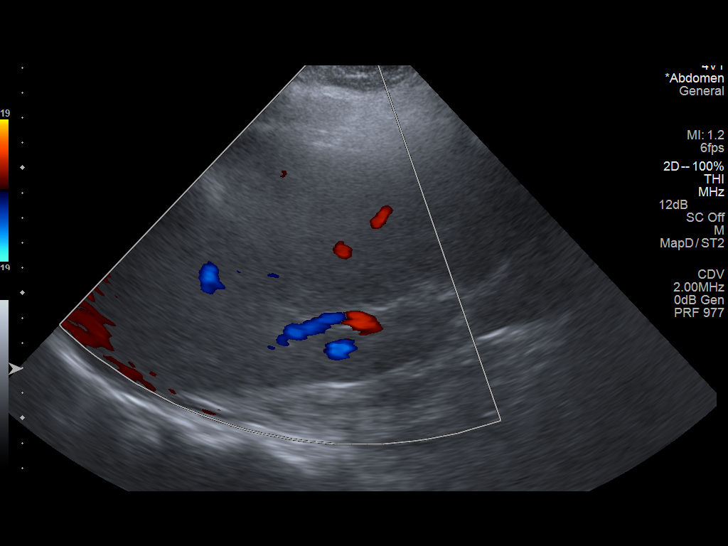
[im 31/123]
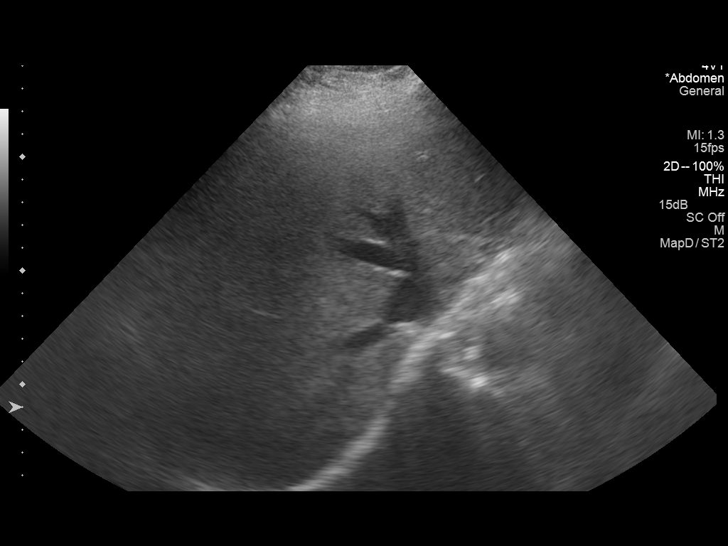
[im 41/123]
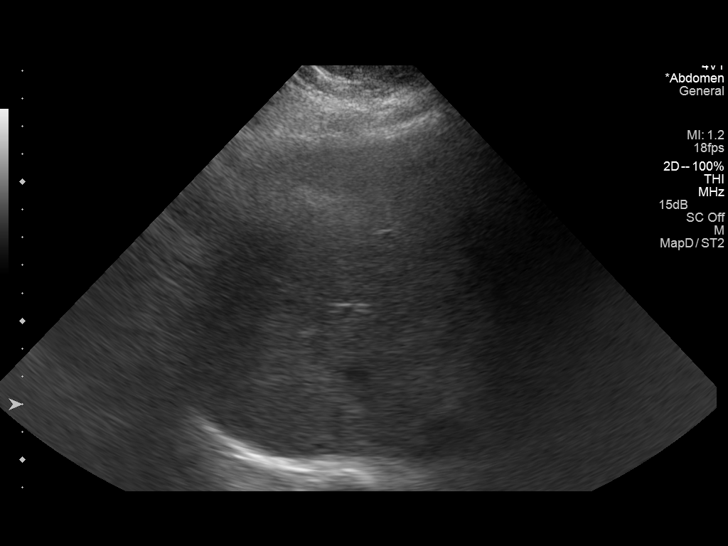
[im 46/123]
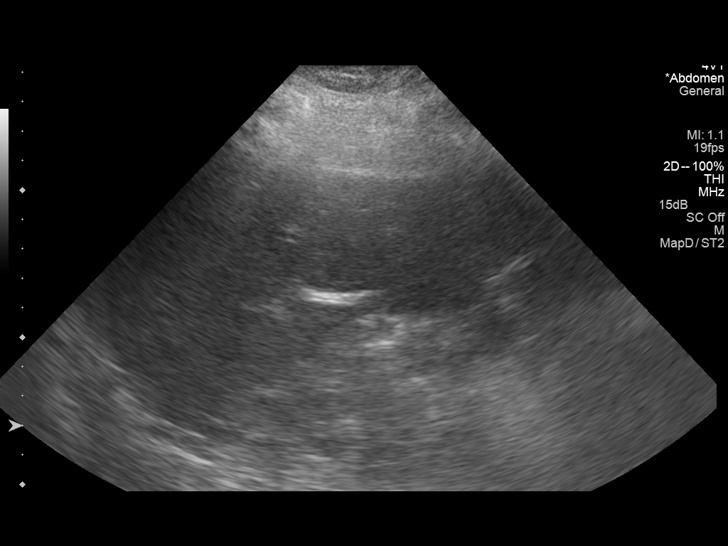
[im 56/123]
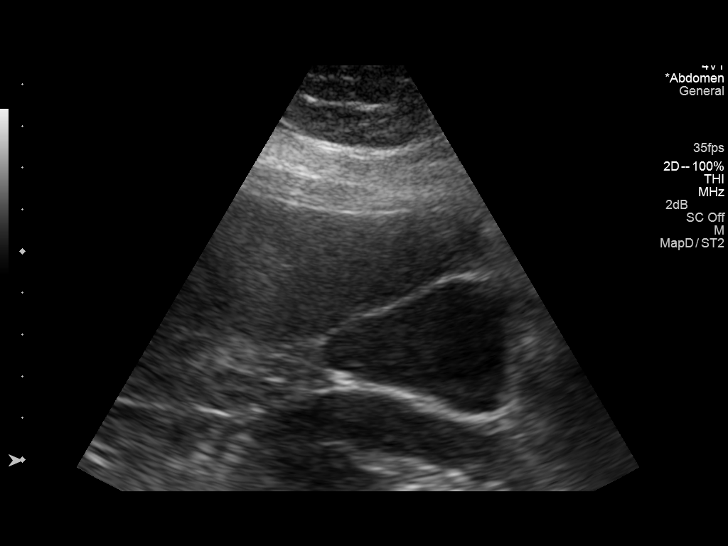
[im 67/123]
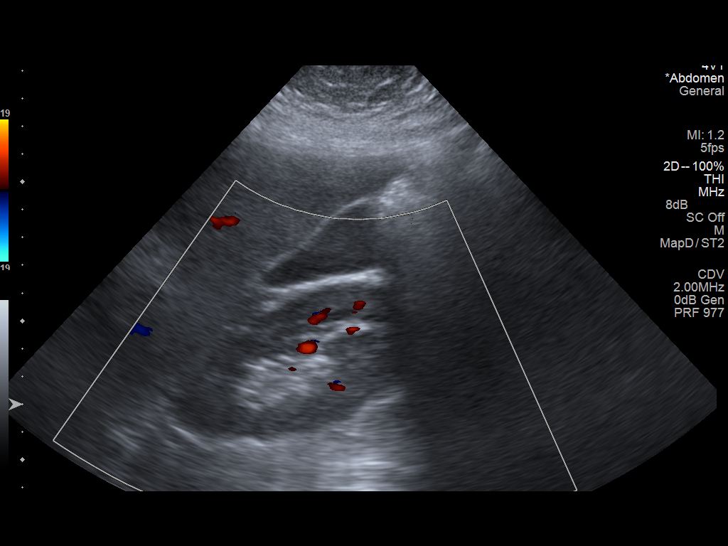
[im 77/123]
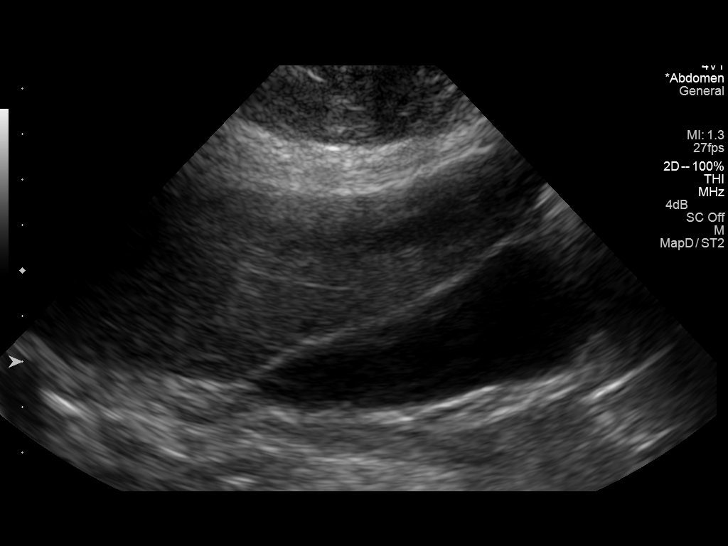
[im 82/123]
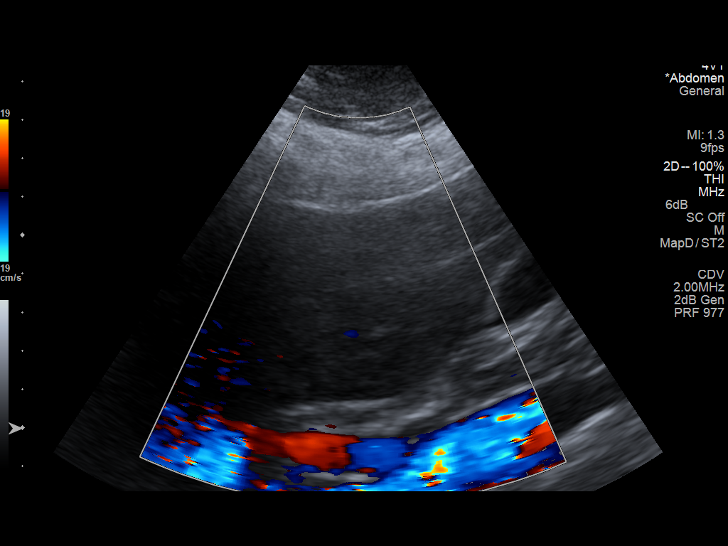
[im 92/123]
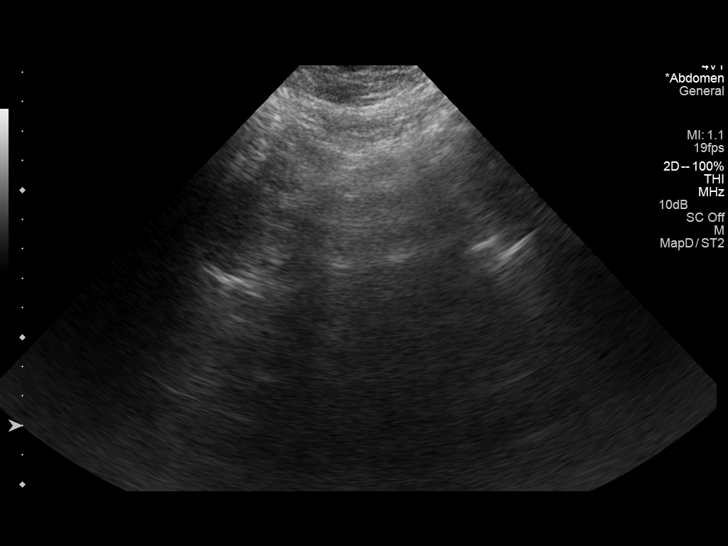
[im 102/123]
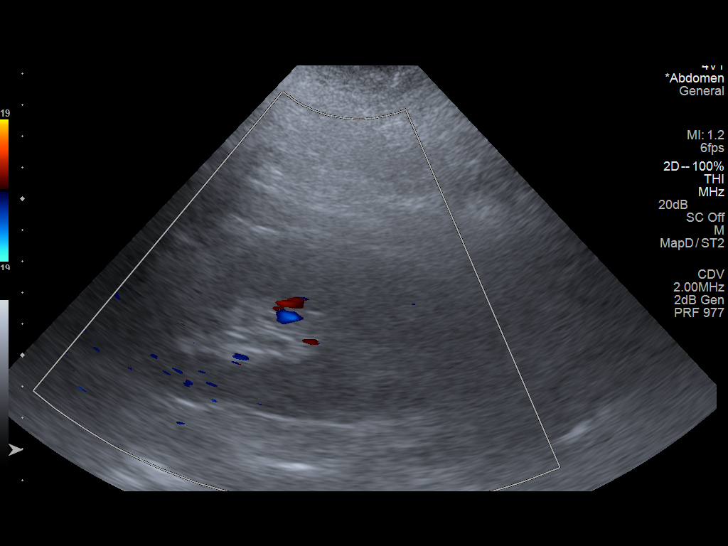
[im 112/123]
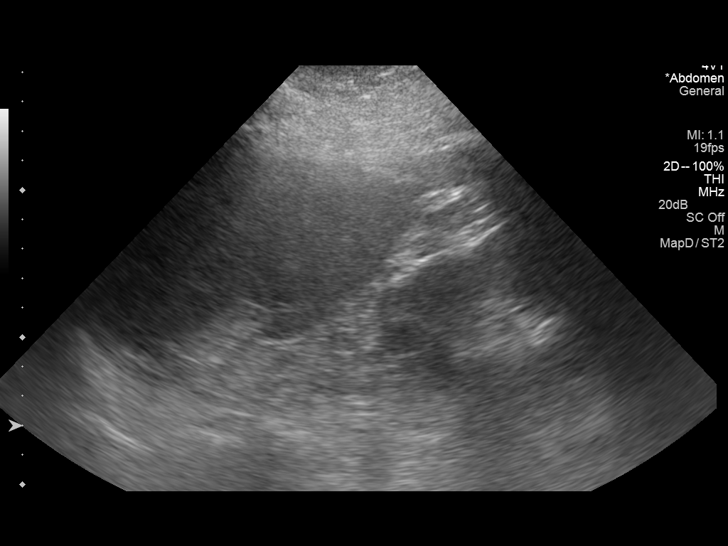
[im 123/123]
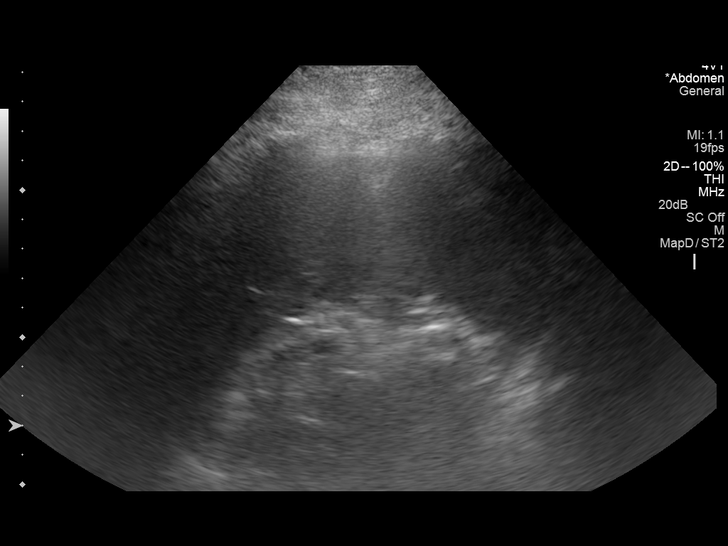

[14 of 25 positions shown; findings below may reference images not displayed]

FINDINGS: Gallbladder:

No gallstones or wall thickening visualized. No sonographic Murphy
sign noted.

Common bile duct:

Diameter: 3.9 mm.

Liver:

Diffuse increased echogenicity consistent with fatty infiltration
and/or hepatocellular disease. No focal hepatic lesion identified.

IVC:

No abnormality visualized.

Pancreas:

Visualized portion unremarkable.

Spleen:

Size and appearance within normal limits.

Right Kidney:

Length: 10.6 cm. Echogenicity within normal limits. No mass or
hydronephrosis visualized.

Left Kidney:

Length: 10.7 cm. Echogenicity within normal limits. No mass or
hydronephrosis visualized.

Abdominal aorta:

No aneurysm visualized.

Other findings:

None.
IMPRESSION: Echo dense liver suggesting fatty infiltration and/or hepatocellular
disease. No focal hepatic abnormality identified.

## 2016-06-03 DIAGNOSIS — E119 Type 2 diabetes mellitus without complications: Secondary | ICD-10-CM | POA: Diagnosis not present

## 2016-06-09 DIAGNOSIS — G4700 Insomnia, unspecified: Secondary | ICD-10-CM | POA: Diagnosis not present

## 2016-06-09 DIAGNOSIS — E119 Type 2 diabetes mellitus without complications: Secondary | ICD-10-CM | POA: Diagnosis not present

## 2016-06-09 DIAGNOSIS — Z23 Encounter for immunization: Secondary | ICD-10-CM | POA: Diagnosis not present

## 2016-07-14 DIAGNOSIS — Z6841 Body Mass Index (BMI) 40.0 and over, adult: Secondary | ICD-10-CM | POA: Diagnosis not present

## 2016-07-14 DIAGNOSIS — G4700 Insomnia, unspecified: Secondary | ICD-10-CM | POA: Diagnosis not present

## 2016-10-13 DIAGNOSIS — Z79899 Other long term (current) drug therapy: Secondary | ICD-10-CM | POA: Diagnosis not present

## 2016-10-13 DIAGNOSIS — F329 Major depressive disorder, single episode, unspecified: Secondary | ICD-10-CM | POA: Diagnosis not present

## 2016-10-13 DIAGNOSIS — E785 Hyperlipidemia, unspecified: Secondary | ICD-10-CM | POA: Diagnosis not present

## 2016-10-20 DIAGNOSIS — E785 Hyperlipidemia, unspecified: Secondary | ICD-10-CM | POA: Diagnosis not present

## 2016-10-20 DIAGNOSIS — G4700 Insomnia, unspecified: Secondary | ICD-10-CM | POA: Diagnosis not present

## 2016-10-20 DIAGNOSIS — E119 Type 2 diabetes mellitus without complications: Secondary | ICD-10-CM | POA: Diagnosis not present

## 2016-12-04 ENCOUNTER — Other Ambulatory Visit: Payer: Self-pay | Admitting: Nurse Practitioner

## 2017-01-07 ENCOUNTER — Encounter: Payer: Self-pay | Admitting: Internal Medicine

## 2017-02-10 ENCOUNTER — Other Ambulatory Visit: Payer: Self-pay | Admitting: Gastroenterology

## 2017-06-04 DIAGNOSIS — Z23 Encounter for immunization: Secondary | ICD-10-CM | POA: Diagnosis not present

## 2017-07-20 DIAGNOSIS — E119 Type 2 diabetes mellitus without complications: Secondary | ICD-10-CM | POA: Diagnosis not present

## 2017-07-26 DIAGNOSIS — E119 Type 2 diabetes mellitus without complications: Secondary | ICD-10-CM | POA: Diagnosis not present

## 2017-07-26 DIAGNOSIS — Z6841 Body Mass Index (BMI) 40.0 and over, adult: Secondary | ICD-10-CM | POA: Diagnosis not present

## 2017-09-02 NOTE — Progress Notes (Signed)
REVIEWED-NO ADDITIONAL RECOMMENDATIONS. 

## 2017-11-17 DIAGNOSIS — I10 Essential (primary) hypertension: Secondary | ICD-10-CM | POA: Diagnosis not present

## 2017-11-17 DIAGNOSIS — E785 Hyperlipidemia, unspecified: Secondary | ICD-10-CM | POA: Diagnosis not present

## 2017-11-17 DIAGNOSIS — E119 Type 2 diabetes mellitus without complications: Secondary | ICD-10-CM | POA: Diagnosis not present

## 2017-11-23 DIAGNOSIS — E119 Type 2 diabetes mellitus without complications: Secondary | ICD-10-CM | POA: Diagnosis not present

## 2017-11-23 DIAGNOSIS — I10 Essential (primary) hypertension: Secondary | ICD-10-CM | POA: Diagnosis not present

## 2017-11-23 DIAGNOSIS — E785 Hyperlipidemia, unspecified: Secondary | ICD-10-CM | POA: Diagnosis not present

## 2018-01-10 ENCOUNTER — Telehealth: Payer: Self-pay | Admitting: Internal Medicine

## 2018-01-10 NOTE — Telephone Encounter (Signed)
Routing message to AM.

## 2018-01-10 NOTE — Telephone Encounter (Signed)
RECALL FOR FIBROSURE TEST

## 2018-01-12 ENCOUNTER — Other Ambulatory Visit: Payer: Self-pay

## 2018-01-12 DIAGNOSIS — K760 Fatty (change of) liver, not elsewhere classified: Secondary | ICD-10-CM

## 2018-01-12 NOTE — Telephone Encounter (Signed)
Orders placed, letter mailed to pt.

## 2018-01-12 NOTE — Telephone Encounter (Signed)
Patty Estrada,   Can we please have her do the [facility] fibrospect NOT the HCV Fibrosure.   That's what she had last time and we need to have equal comparison.

## 2018-01-13 NOTE — Telephone Encounter (Signed)
Noted. Will call pt with updated information.

## 2018-01-18 DIAGNOSIS — M25561 Pain in right knee: Secondary | ICD-10-CM | POA: Diagnosis not present

## 2018-01-20 ENCOUNTER — Ambulatory Visit (HOSPITAL_COMMUNITY)
Admission: RE | Admit: 2018-01-20 | Discharge: 2018-01-20 | Disposition: A | Discharge status: Home / Self Care | Payer: Medicare Other | Source: Ambulatory Visit | Attending: Internal Medicine | Admitting: Internal Medicine

## 2018-01-20 ENCOUNTER — Other Ambulatory Visit (HOSPITAL_COMMUNITY): Payer: Self-pay | Admitting: Internal Medicine

## 2018-01-20 DIAGNOSIS — M1711 Unilateral primary osteoarthritis, right knee: Secondary | ICD-10-CM | POA: Diagnosis not present

## 2018-01-20 DIAGNOSIS — M25561 Pain in right knee: Secondary | ICD-10-CM | POA: Diagnosis not present

## 2018-01-27 NOTE — Telephone Encounter (Signed)
[facility] lab kit is ready for pick up. Tried calling pt. No answer. Will call back.

## 2018-02-02 NOTE — Telephone Encounter (Signed)
Pt returned call. Pt has been dealing with bad sciatica and is able to move around much. Pt will stop by the office when she is better and pick up Prometheus kit. Prometheus kit was left up front on the shelf with orders.   Letter was previously mailed to pt due to not responding to phone calls. Pt was asked to disregard letter when it arrives.

## 2018-02-02 NOTE — Telephone Encounter (Signed)
Noted  

## 2018-03-18 DIAGNOSIS — E119 Type 2 diabetes mellitus without complications: Secondary | ICD-10-CM | POA: Diagnosis not present

## 2018-03-25 DIAGNOSIS — I10 Essential (primary) hypertension: Secondary | ICD-10-CM | POA: Diagnosis not present

## 2018-03-25 DIAGNOSIS — E1159 Type 2 diabetes mellitus with other circulatory complications: Secondary | ICD-10-CM | POA: Diagnosis not present

## 2018-03-25 DIAGNOSIS — Z23 Encounter for immunization: Secondary | ICD-10-CM | POA: Diagnosis not present

## 2018-08-23 DIAGNOSIS — E1159 Type 2 diabetes mellitus with other circulatory complications: Secondary | ICD-10-CM | POA: Diagnosis not present

## 2018-08-23 DIAGNOSIS — E1122 Type 2 diabetes mellitus with diabetic chronic kidney disease: Secondary | ICD-10-CM | POA: Diagnosis not present

## 2018-08-30 DIAGNOSIS — E785 Hyperlipidemia, unspecified: Secondary | ICD-10-CM | POA: Diagnosis not present

## 2018-08-30 DIAGNOSIS — E1169 Type 2 diabetes mellitus with other specified complication: Secondary | ICD-10-CM | POA: Diagnosis not present

## 2018-08-30 DIAGNOSIS — I10 Essential (primary) hypertension: Secondary | ICD-10-CM | POA: Diagnosis not present

## 2018-08-30 DIAGNOSIS — Z6841 Body Mass Index (BMI) 40.0 and over, adult: Secondary | ICD-10-CM | POA: Diagnosis not present

## 2019-01-03 DIAGNOSIS — E785 Hyperlipidemia, unspecified: Secondary | ICD-10-CM | POA: Diagnosis not present

## 2019-01-03 DIAGNOSIS — E1169 Type 2 diabetes mellitus with other specified complication: Secondary | ICD-10-CM | POA: Diagnosis not present

## 2019-01-03 DIAGNOSIS — I10 Essential (primary) hypertension: Secondary | ICD-10-CM | POA: Diagnosis not present

## 2019-01-03 DIAGNOSIS — G8929 Other chronic pain: Secondary | ICD-10-CM | POA: Diagnosis not present

## 2019-05-08 DIAGNOSIS — E785 Hyperlipidemia, unspecified: Secondary | ICD-10-CM | POA: Diagnosis not present

## 2019-05-08 DIAGNOSIS — I10 Essential (primary) hypertension: Secondary | ICD-10-CM | POA: Diagnosis not present

## 2019-05-08 DIAGNOSIS — E1169 Type 2 diabetes mellitus with other specified complication: Secondary | ICD-10-CM | POA: Diagnosis not present

## 2019-05-08 DIAGNOSIS — F330 Major depressive disorder, recurrent, mild: Secondary | ICD-10-CM | POA: Diagnosis not present

## 2019-10-12 ENCOUNTER — Other Ambulatory Visit: Payer: Self-pay | Admitting: Internal Medicine

## 2019-10-12 DIAGNOSIS — Z1231 Encounter for screening mammogram for malignant neoplasm of breast: Secondary | ICD-10-CM

## 2019-10-30 DIAGNOSIS — Z79899 Other long term (current) drug therapy: Secondary | ICD-10-CM | POA: Diagnosis not present

## 2019-10-30 DIAGNOSIS — F329 Major depressive disorder, single episode, unspecified: Secondary | ICD-10-CM | POA: Diagnosis not present

## 2019-10-30 DIAGNOSIS — I10 Essential (primary) hypertension: Secondary | ICD-10-CM | POA: Diagnosis not present

## 2019-10-30 DIAGNOSIS — E118 Type 2 diabetes mellitus with unspecified complications: Secondary | ICD-10-CM | POA: Diagnosis not present

## 2019-11-03 DIAGNOSIS — N390 Urinary tract infection, site not specified: Secondary | ICD-10-CM | POA: Diagnosis not present

## 2019-11-03 DIAGNOSIS — Z6841 Body Mass Index (BMI) 40.0 and over, adult: Secondary | ICD-10-CM | POA: Diagnosis not present

## 2019-11-03 DIAGNOSIS — E785 Hyperlipidemia, unspecified: Secondary | ICD-10-CM | POA: Diagnosis not present

## 2019-11-03 DIAGNOSIS — E118 Type 2 diabetes mellitus with unspecified complications: Secondary | ICD-10-CM | POA: Diagnosis not present

## 2019-12-23 IMAGING — DX DG KNEE 3 VIEWS*R*
3 series · 3 of 3 positions shown · non-contrast
Comparison: None.

CLINICAL DATA: Right knee pain for 3 weeks

EXAM:
RIGHT KNEE - 3 VIEW

[knee ap]
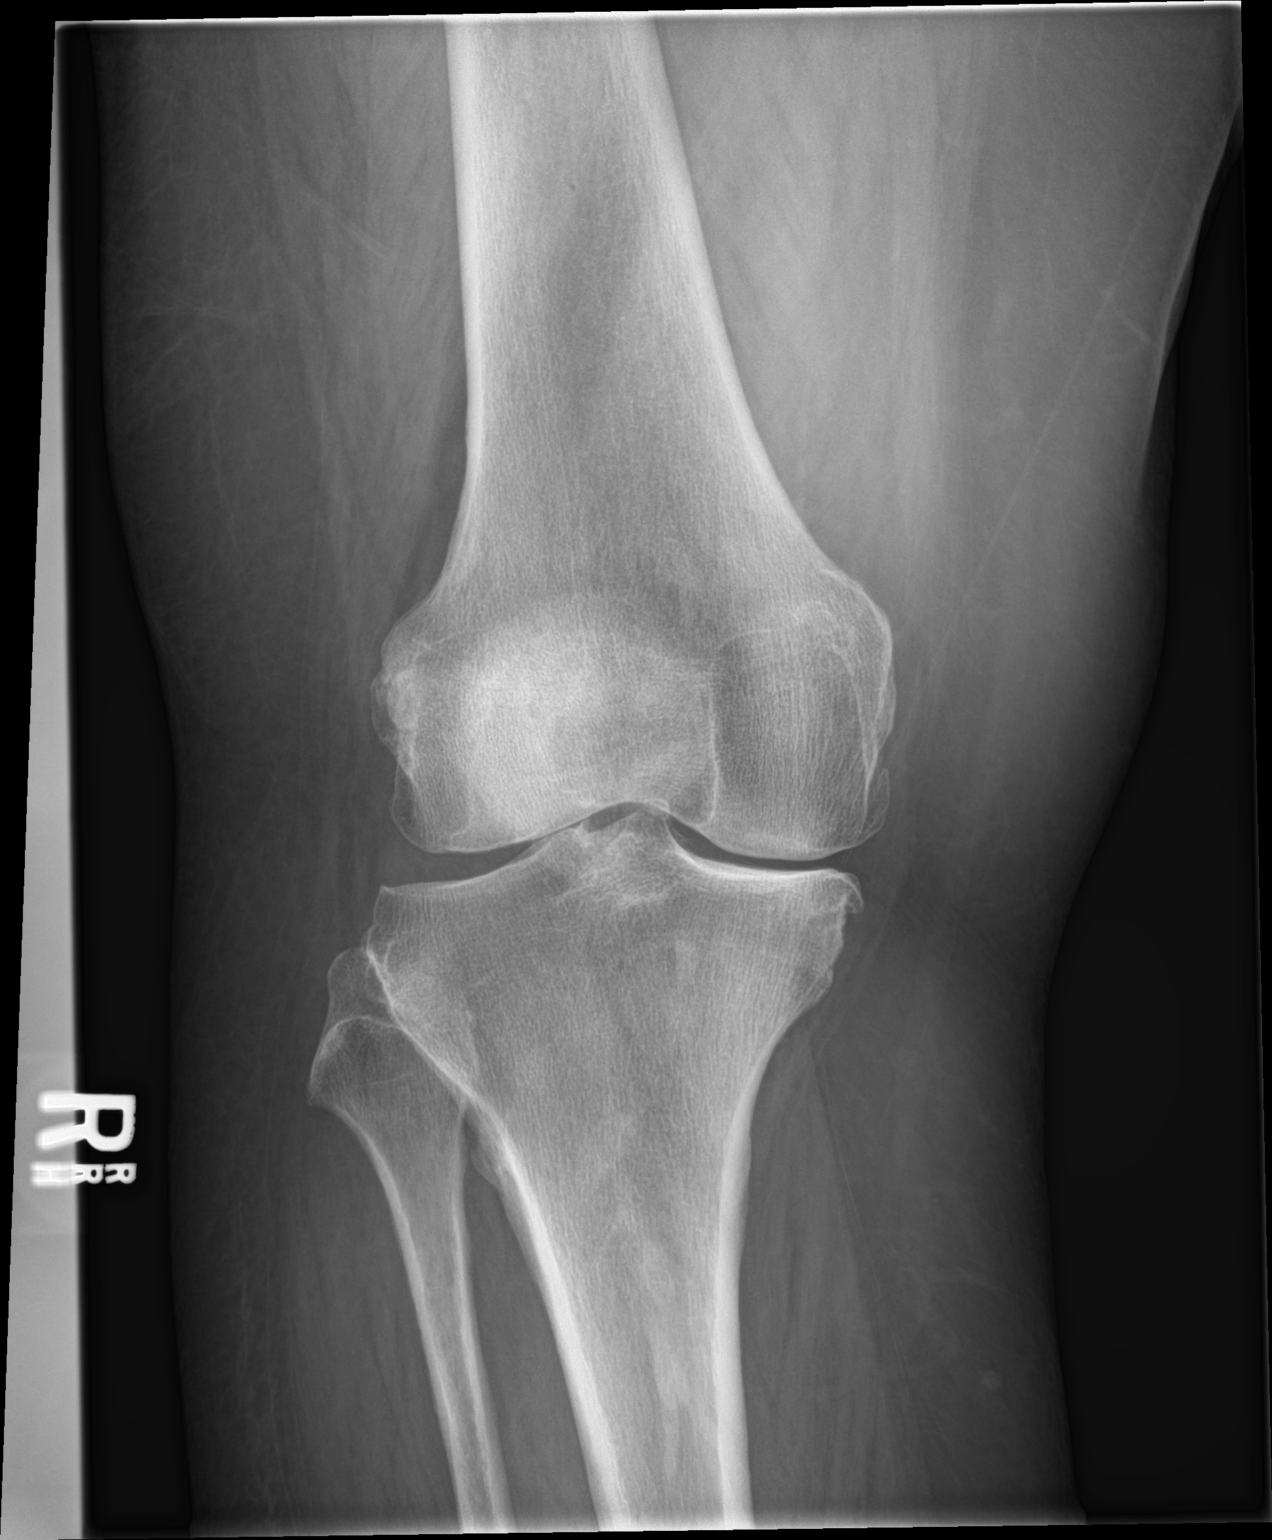

[knee lat]
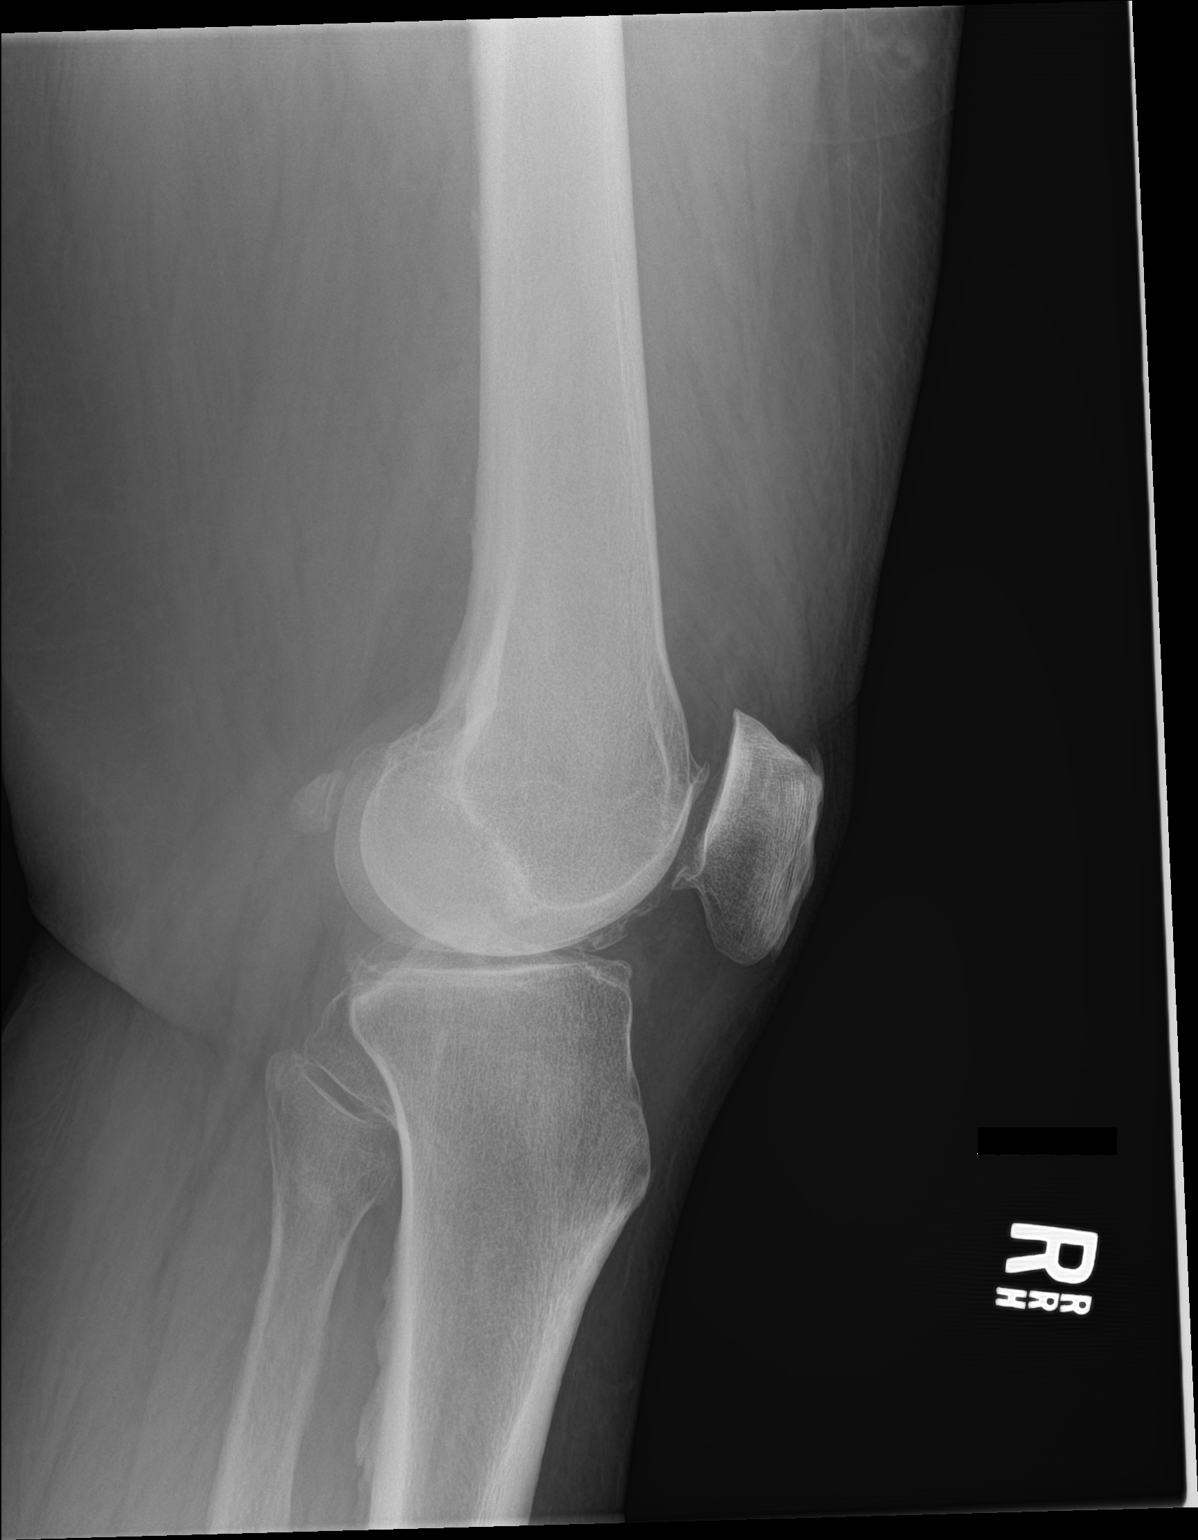

[patella skyline]
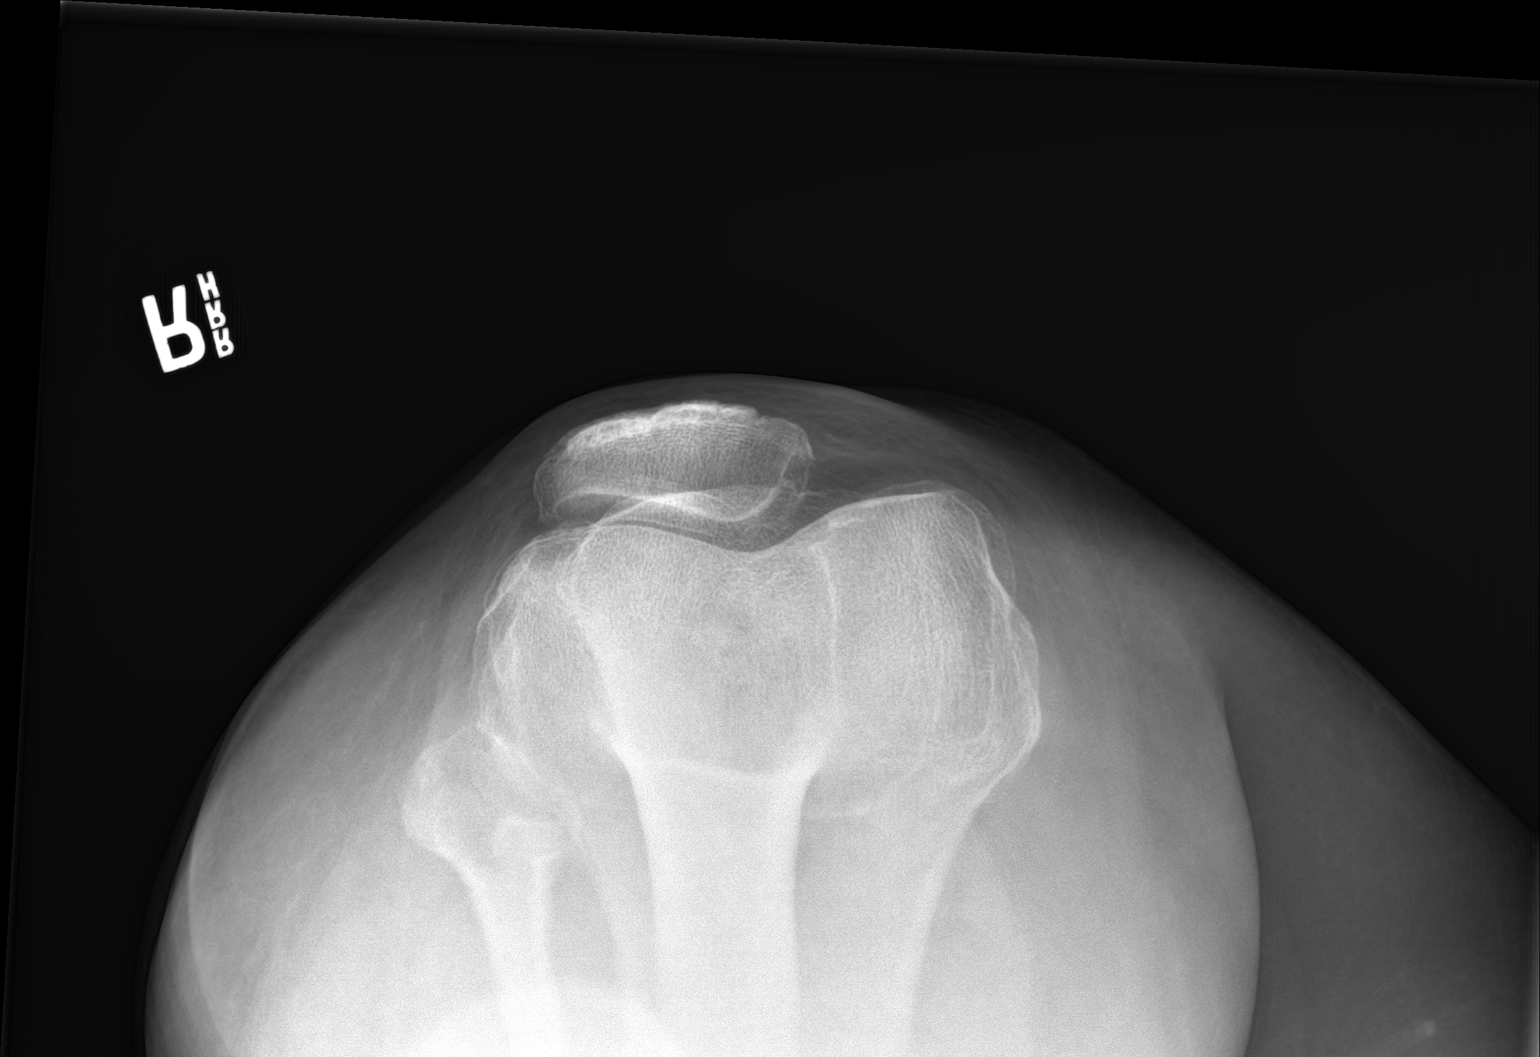

[3 of 3 positions shown; findings below may reference images not displayed]

FINDINGS: No acute fracture or dislocation. Moderate medial femorotibial
compartment joint space narrowing. Mild lateral femorotibial
compartment joint space narrowing. Mild patellofemoral compartment
joint space narrowing. Tricompartmental marginal osteophytes. No
joint effusion. No soft tissue abnormality.
IMPRESSION: 1.  No acute osseous injury of the right knee.
2. Tricompartmental osteoarthritis as described above.

## 2020-02-29 DIAGNOSIS — E118 Type 2 diabetes mellitus with unspecified complications: Secondary | ICD-10-CM | POA: Diagnosis not present

## 2020-03-07 DIAGNOSIS — E1169 Type 2 diabetes mellitus with other specified complication: Secondary | ICD-10-CM | POA: Diagnosis not present

## 2020-03-07 DIAGNOSIS — I480 Paroxysmal atrial fibrillation: Secondary | ICD-10-CM | POA: Diagnosis not present

## 2020-03-07 DIAGNOSIS — Z6841 Body Mass Index (BMI) 40.0 and over, adult: Secondary | ICD-10-CM | POA: Diagnosis not present

## 2020-03-07 DIAGNOSIS — I10 Essential (primary) hypertension: Secondary | ICD-10-CM | POA: Diagnosis not present

## 2020-04-26 DIAGNOSIS — Z23 Encounter for immunization: Secondary | ICD-10-CM | POA: Diagnosis not present

## 2020-09-26 DIAGNOSIS — E118 Type 2 diabetes mellitus with unspecified complications: Secondary | ICD-10-CM | POA: Diagnosis not present

## 2020-10-03 DIAGNOSIS — E1169 Type 2 diabetes mellitus with other specified complication: Secondary | ICD-10-CM | POA: Diagnosis not present

## 2020-10-03 DIAGNOSIS — I10 Essential (primary) hypertension: Secondary | ICD-10-CM | POA: Diagnosis not present

## 2020-10-03 DIAGNOSIS — N938 Other specified abnormal uterine and vaginal bleeding: Secondary | ICD-10-CM | POA: Diagnosis not present

## 2020-10-28 ENCOUNTER — Encounter: Payer: Self-pay | Admitting: Obstetrics & Gynecology

## 2020-10-28 ENCOUNTER — Other Ambulatory Visit: Payer: Self-pay | Admitting: Obstetrics & Gynecology

## 2020-10-28 ENCOUNTER — Ambulatory Visit (INDEPENDENT_AMBULATORY_CARE_PROVIDER_SITE_OTHER): Payer: Medicare Other | Admitting: Obstetrics & Gynecology

## 2020-10-28 ENCOUNTER — Other Ambulatory Visit: Payer: Self-pay

## 2020-10-28 VITALS — BP 148/68 | HR 82 | Ht 63.0 in | Wt 310.0 lb

## 2020-10-28 DIAGNOSIS — C541 Malignant neoplasm of endometrium: Secondary | ICD-10-CM | POA: Diagnosis not present

## 2020-10-28 DIAGNOSIS — N950 Postmenopausal bleeding: Secondary | ICD-10-CM | POA: Diagnosis not present

## 2020-10-28 NOTE — Addendum Note (Signed)
Addended by: Florian Buff on: 10/28/2020 02:52 PM   Modules accepted: Orders

## 2020-10-28 NOTE — Progress Notes (Signed)
Endometrial Biopsy Procedure Note  Pre-operative Diagnosis: Post menopausal bleeding x 2 years  Morbid obesity  Enlarged uterus  Post-operative Diagnosis: same  Indications: postmenopausal bleeding  Procedure Details   Urine pregnancy test was not done.  The risks (including infection, bleeding, pain, and uterine perforation) and benefits of the procedure were explained to the patient and Written informed consent was obtained.  Antibiotic prophylaxis against endocarditis was not indicated.   The patient was placed in the dorsal lithotomy position.  Bimanual exam showed the uterus to be in the neutral position.  A Graves' speculum inserted in the vagina, and the cervix prepped with povidone iodine.  Endocervical curettage with a Kevorkian curette was not performed.   A sharp tenaculum was applied to the anterior lip of the cervix for stabilization.  A sterile uterine sound was used to sound the uterus to a depth of 12 cm  A Pipelle endometrial aspirator was used to sample the endometrium.  Sample was sent for pathologic examination.  Condition: Stable  Complications: None  Plan:  The patient was advised to call for any fever or for prolonged or severe pain or bleeding. She was advised to use OTC analgesics as needed for mild to moderate pain. She was advised to avoid vaginal intercourse for 48 hours or until the bleeding has completely stopped.  Attending Physician Documentation: I was present for or performed the following: endometrial biopsy

## 2020-11-18 ENCOUNTER — Encounter: Payer: Self-pay | Admitting: Obstetrics & Gynecology

## 2020-11-18 ENCOUNTER — Other Ambulatory Visit: Payer: Self-pay

## 2020-11-18 ENCOUNTER — Ambulatory Visit (INDEPENDENT_AMBULATORY_CARE_PROVIDER_SITE_OTHER): Payer: Medicare Other | Admitting: Obstetrics & Gynecology

## 2020-11-18 ENCOUNTER — Ambulatory Visit (INDEPENDENT_AMBULATORY_CARE_PROVIDER_SITE_OTHER): Payer: Medicare Other

## 2020-11-18 VITALS — BP 152/92 | HR 96 | Wt 311.0 lb

## 2020-11-18 DIAGNOSIS — C541 Malignant neoplasm of endometrium: Secondary | ICD-10-CM | POA: Diagnosis not present

## 2020-11-18 DIAGNOSIS — N950 Postmenopausal bleeding: Secondary | ICD-10-CM | POA: Diagnosis not present

## 2020-11-18 NOTE — Progress Notes (Addendum)
PELVIC US TV(only) unable to visualized uterus with TA probe because of pt body habitus, enlarged homogeneous anteverted uterus,wnl,homogeneous thickened endometrium with color flow,EEC 42 mm,normal ovaries (limited view),no free fluid,no pain during ultrasound   Chaperone Peggy

## 2020-11-18 NOTE — Progress Notes (Signed)
Follow up appointment for results  Chief Complaint  Patient presents with  . Follow-up    [place]    Blood pressure (!) 152/92, pulse 96, weight (!) 311 lb (141.1 kg).  US PELVIS (TRANSABDOMINAL ONLY)  Result Date: 11/18/2020  [facility] [address] [place] [address]                                                                                             GYNECOLOGIC SONOGRAM Patty Estrada is a 70 y.o. [phone] No LMP recorded. Patient is postmenopausal. She is here for a pelvic sonogram for postmenopausal bleeding. Uterus                      12.2 x 9.3 x 9.7 cm, Total uterine volume 572 cc,enlarged homogeneous anteverted uterus,wnl Endometrium          42 mm, symmetrical, homogeneous thickened endometrium with color flow Right ovary             2.4 x 2.9 x 2.3 cm, wnl,limited view Left ovary                3.2 x 2.4 x 2.6 cm, wnl,limited view No free fluid Technician Comments: PELVIC US TV(only) unable to visualized uterus with TA probe because of pt body habitus, enlarged homogeneous anteverted uterus,wnl,homogeneous thickened endometrium with color flow,EEC 42 mm,normal ovaries (limited view),no free fluid,no pain during ultrasound Chaperone [address] Heide Guile 11/18/2020 3:58 PM Clinical Impression and recommendations: I have reviewed the sonogram results above, combined with the patient's current clinical course, below are my impressions and any appropriate recommendations for management based on the sonographic findings. Uterus is enlarged due to fibroids with prominent endometrium, already biopsy proven endometrial adenocarcinoma, suspicious for superficial involvement of the myometrium Endometrium significantly thickened at 42 mmwith homogenous appearance consistent with Grade 1 adenocarcinoma Ovaries are normal in size shape and contour Patty Estrada 11/18/2020 4:08 PM   Endometrial biopsy: 10/28/20: FIGO Grade I endometrial adenocarinoma  MEDS  ordered this encounter: No orders of the defined types were placed in this encounter.   Orders for this encounter: No orders of the defined types were placed in this encounter.   Impression:   ICD-10-CM   1. Endometrial adenocarcinoma ([place]), FIGO Grade 1  C54.1    referred to Dr Jeral Pinch Gyn Oncology for further evaluation and management, patient does not want to get CT scan til after she sees Dr Berline Lopes     Plan: As above  Follow Up: Return if symptoms worsen or fail to improve, for to see Dr Berline Lopes already scheduled next week.      All questions were answered.  Past Medical History:  Diagnosis Date  . Adenomatous colon polyp 2006      . Arthritis   . Atrial fibrillation ([place]) 2013  . Depression   . Fatty liver    ?cirrhosis on u/s in 2008  . GERD (gastroesophageal reflux disease)   . HTN (hypertension)   . Hyperlipidemia   . NASH (nonalcoholic steatohepatitis)   . Obesity   .  Urinary incontinence   . Vitamin D deficiency     Past Surgical History:  Procedure Laterality Date  . COLONOSCOPY  04/09/2005   adenomatous polyps, two diverticula  . COLONOSCOPY  12/2010   RMR: left-sided diverticula, adenomatous polyps, next TCS due 12/2015  . ESOPHAGOGASTRODUODENOSCOPY  2006   hyperplastic polyps, hh  . ESOPHAGOGASTRODUODENOSCOPY  12/2010   RMR:hh, multiple benign gastric polyps,   . HYSTEROSCOPY  02/27/2014   Procedure: HYSTEROSCOPY;  Surgeon: Jonnie Kind, MD;  Location: [facility];  Service: Gynecology;;  . POLYPECTOMY N/A 02/27/2014   Procedure: REMOVAL OF ENDOCERVICAL POLYP AND ENDOMETRIAL POLYPS;  Surgeon: Jonnie Kind, MD;  Location: [facility];  Service: Gynecology;  Laterality: N/A;  . TUBAL LIGATION      OB History    Gravida  2   Para  2   Term  2   Preterm  0   AB  0   Living  2     SAB  0   IAB  0   Ectopic  0   Multiple  0   Live Births  2           Allergies  Allergen Reactions  . Gabapentin     REACTION: confusion   . Nitrofurantoin     REACTION: Hives    Social History   Socioeconomic History  . Marital status: Married    Spouse name: Not on file  . Number of children: 2  . Years of education: Not on file  . Highest education level: Not on file  Occupational History  . Not on file  Tobacco Use  . Smoking status: Former Smoker    Packs/day: 2.00    Years: 13.00    Pack years: 26.00    Types: Cigarettes    Quit date: 07/06/1984    Years since quitting: 36.3  . Smokeless tobacco: Never Used  . Tobacco comment: Quit since 1986  Vaping Use  . Vaping Use: Never used  Substance and Sexual Activity  . Alcohol use: Yes    Alcohol/week: 0.0 standard drinks    Comment: maybe one or two drinks a year  . Drug use: No  . Sexual activity: Not Currently    Birth control/protection: Surgical, Post-menopausal  Other Topics Concern  . 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   Social Determinants of Health   Financial Resource Strain: Low Risk   . Difficulty of Paying Living Expenses: Not very hard  Food Insecurity: No Food Insecurity  . Worried About Charity fundraiser in the Last Year: Never true  . Ran Out of Food in the Last Year: Never true  Transportation Needs: No Transportation Needs  . Lack of Transportation (Medical): No  . Lack of Transportation (Non-Medical): No  Physical Activity: Inactive  . Days of Exercise per Week: 0 days  . Minutes of Exercise per Session: 0 min  Stress: No Stress Concern Present  . Feeling of Stress : Only a little  Social Connections: Moderately Isolated  . Frequency of Communication with Friends and Family: More than three times a week  . Frequency of Social Gatherings with Friends and Family: Patient refused  . Attends Religious Services: Never  . Active Member of Clubs or Organizations: No  . Attends Archivist Meetings: Never  . Marital Status: Married    Family History  Problem Relation Age of  Onset  . Breast cancer Mother 78  .  Heart attack Father 69  . Heart disease Brother   . Heart attack Brother   . Colon cancer Neg Hx   . Liver disease Neg Hx

## 2020-11-25 ENCOUNTER — Encounter: Payer: Self-pay | Admitting: Gynecologic Oncology

## 2020-11-25 NOTE — Progress Notes (Signed)
GYNECOLOGIC ONCOLOGY NEW PATIENT CONSULTATION   Patient Name: Patty Estrada  Patient Age: 70 y.o. Date of Service: 11/26/20 Referring Provider: Dr Tania Ade  Primary Care Provider: Asencion Noble, MD Consulting Provider: Jeral Pinch, MD   Assessment/Plan:  Postmenopausal patient with clinical stage I grade 1 endometrial endometrioid adenocarcinoma.  We reviewed the nature of endometrial cancer and its recommended surgical staging, including total hysterectomy, bilateral salpingo-oophorectomy, and lymph node assessment. The patient is a suitable candidate for staging via a minimally invasive approach to surgery.  We reviewed that robotic assistance would be used to complete the surgery.   Given her weight and pulmonary comorbidities, we discussed that she may not be able to tolerate Trendelenburg or Trendelenburg with insufflation needed for robotic surgery.  If it is not appear to be safe to proceed with robotic staging, then my recommendation is that we perform a D&C with Mirena IUD placement for treatment of her uterine cancer while we work more aggressively on weight loss.  We discussed that most endometrial cancer is detected early and that decisions regarding adjuvant therapy will be made based on her final pathology.   We reviewed the sentinel lymph node technique. Risks and benefits of sentinel lymph node biopsy was reviewed. We reviewed the technique and ICG dye. The patient DOES NOT have an iodine allergy or known liver dysfunction. We reviewed the false negative rate (0.4%), and that 3% of patients with metastatic disease will not have it detected by SLN biopsy in endometrial cancer. A low risk of allergic reaction to the dye, <0.2% for ICG, has been reported. We also discussed that in the case of failed mapping, which occurs 40% of the time, a bilateral or unilateral lymphadenectomy will be performed at the surgeon's discretion.   Potential benefits of sentinel nodes including a  higher detection rate for metastasis due to ultrastaging and potential reduction in operative morbidity. However, there remains uncertainty as to the role for treatment of micrometastatic disease. Further, the benefit of operative morbidity associated with the SLN technique in endometrial cancer is not yet completely known. In other patient populations (e.g. the cervical cancer population) there has been observed reductions in morbidity with SLN biopsy compared to pelvic lymphadenectomy. Lymphedema, nerve dysfunction and lymphocysts are all potential risks with the SLN technique as with complete lymphadenectomy. Additional risks to the patient include the risk of damage to an internal organ while operating in an altered view (e.g. the black and white image of the robotic fluorescence imaging mode).   We discussed the plan for a robotic assisted hysterectomy, bilateral salpingo-oophorectomy, sentinel lymph node evaluation, possible lymph node dissection, possible laparotomy, possible D&C at the start of surgery for uterine compression, possible D&C with Mirena IUD if patient unable to tolerate Trendelenburg. The risks of surgery were discussed in detail and she understands these to include infection; wound separation; hernia; vaginal cuff separation, injury to adjacent organs such as bowel, bladder, blood vessels, ureters and nerves; bleeding which may require blood transfusion; anesthesia risk; thromboembolic events; possible death; unforeseen complications; possible need for re-exploration; medical complications such as heart attack, stroke, pleural effusion and pneumonia; and, if full lymphadenectomy is performed the risk of lymphedema and lymphocyst. The patient will receive DVT and antibiotic prophylaxis as indicated. She voiced a clear understanding. She had the opportunity to ask questions. Perioperative instructions were reviewed with her. Prescriptions for post-op medications were sent to her pharmacy of  choice.  I discussed with the patient the role that excess estrogen plays  in the pathogenesis of endometrioid endometrial adenocarcinoma.  She is committed to continuing to work on weight loss.  She has found a weight management program in [facility] and is going to reach out for consultation.  A copy of this note was sent to the patient's referring provider.   65 minutes of total time was spent for this patient encounter, including preparation, face-to-face counseling with the patient and coordination of care, and documentation of the encounter.  Jeral Pinch, MD  Division of Gynecologic Oncology  Department of Obstetrics and Gynecology  [facility]  ___________________________________________  Chief Complaint: Chief Complaint  Patient presents with  . Endometrial adenocarcinoma ([facility])    History of Present Illness:  Patty Estrada is a 70 y.o. y.o. female who is seen in consultation at the request of Dr. Elonda Husky for an evaluation of newly diagnosed endometrial cancer.  Patient endorses going through menopause at approximately age 21.  In 2015, she presented with vaginal bleeding.  In August of that year she underwent a D&C with findings of an endocervical as well as endometrial polyp.  There was no atypia or hyperplasia noted on final pathology.  Her bleeding then stopped completely.  At the beginning of COVID, she started having very light vaginal bleeding.  It progressively increased although it is heavy as she describes using 1 or 2 ultrathin pads a day which were not saturated.  She would have intermittent episodes where she would stop bleeding completely, others where she would have dark and old appearing blood, and other times where she would have bright red bleeding.  Denies any associated pelvic pain or cramping.  Since her biopsy on the 25th, she had more significant bleeding immediately after and has since stopped having any bleeding.  She endorses  a good appetite without any nausea or emesis.  She reports regular bowel function.  She has some urinary incontinence which she describes as leaking when she bends or has position changes.  This is new in the last couple of months.  She endorses a medical history of obesity, hypertension, hyperlipidemia, depression, arthritis, and prediabetes.  Her hemoglobin A1c in February was 6.1%.  She denies any shortness of breath or chest pain with ambulation.  She uses a cane for walking assistance secondary to arthritis and related pain and balance issues.  She may have sleep apnea although when she went for sleep study, she could not sleep and they were unable to diagnose sleep apnea.  The patient weighed 400 pounds at her heaviest and over a number of years has been able to lose approximately 100 pounds.  The patient lives in [place] with her husband and one of her grown daughters.  PAST MEDICAL HISTORY:  Past Medical History:  Diagnosis Date  . Adenomatous colon polyp 2006      . Arthritis   . Atrial fibrillation ([place]) 2013  . Depression   . Endometrial cancer ([place])   . Fatty liver    ?cirrhosis on u/s in 2008  . GERD (gastroesophageal reflux disease)   . HTN (hypertension)   . Hyperglycemia   . Hyperlipidemia   . NASH (nonalcoholic steatohepatitis)   . Obesity   . Urinary incontinence   . Vitamin D deficiency      PAST SURGICAL HISTORY:  Past Surgical History:  Procedure Laterality Date  . COLONOSCOPY  04/09/2005   adenomatous polyps, two diverticula  . COLONOSCOPY  12/2010   RMR: left-sided diverticula, adenomatous polyps, next TCS due 12/2015  .  DILATION AND CURETTAGE OF UTERUS  2015  . ESOPHAGOGASTRODUODENOSCOPY  2006   hyperplastic polyps, hh  . ESOPHAGOGASTRODUODENOSCOPY  12/2010   RMR:hh, multiple benign gastric polyps,   . HYSTEROSCOPY  02/27/2014   Procedure: HYSTEROSCOPY;  Surgeon: Jonnie Kind, MD;  Location: [facility];  Service: Gynecology;;  . POLYPECTOMY N/A 02/27/2014    Procedure: REMOVAL OF ENDOCERVICAL POLYP AND ENDOMETRIAL POLYPS;  Surgeon: Jonnie Kind, MD;  Location: [facility];  Service: Gynecology;  Laterality: N/A;  . TUBAL LIGATION      OB/GYN HISTORY:  OB History  Gravida Para Term Preterm AB Living  2 2 2  0 0 2  SAB IAB Ectopic Multiple Live Births  0 0 0 0 2    # Outcome Date GA Lbr Len/2nd Weight Sex Delivery Anes PTL Lv  2 Term           1 Term             No LMP recorded. Patient is postmenopausal.  Age at menarche: 28 Age at menopause: 84 Hx of HRT: Denies Hx of STDs: Denies Last pap: 2010 approximately History of abnormal pap smears: Denies  SCREENING STUDIES:  Last mammogram: Does not remember but thinks maybe 2010.  There is one ordered although it has not been scheduled.  Last colonoscopy: 2012  MEDICATIONS: Outpatient Encounter Medications as of 11/26/2020  Medication Sig  . acetaminophen (TYLENOL) 650 MG CR tablet Take 1,300 mg by mouth daily as needed for pain.  Marland Kitchen atenolol (TENORMIN) 50 MG tablet Take 50 mg by mouth daily.  Marland Kitchen atorvastatin (LIPITOR) 40 MG tablet Take 40 mg by mouth daily.  . benazepril (LOTENSIN) 40 MG tablet Take 40 mg by mouth daily.  . diclofenac sodium (VOLTAREN) 1 % GEL Apply 1 application topically as needed. Uses 1-2 times/day for arthritic knee pain  . methocarbamol (ROBAXIN) 500 MG tablet Take 500 mg by mouth as needed.  . mirtazapine (REMERON) 15 MG tablet Take 7.5 mg by mouth at bedtime.  Marland Kitchen OVER THE COUNTER MEDICATION Take 2 capsules by mouth daily. Omega 3/ALA/Chromium  . polyethylene glycol powder (GLYCOLAX/MIRALAX) powder MIX 1 CAPFUL IN LIQUID AT BEDTIME AS NEEDED FOR CONSTIPATION.  . Probiotic Product (PROBIOTIC DAILY) CAPS Take by mouth.  . senna-docusate (SENOKOT-S) 8.6-50 MG tablet Take 2 tablets by mouth at bedtime. For AFTER surgery, do not take if having diarrhea  . sodium-potassium bicarbonate (ALKA-SELTZER GOLD) TBEF dissolvable tablet Take 2 tablets by mouth as needed.  .  traMADol (ULTRAM) 50 MG tablet Take by mouth every 6 (six) hours as needed. One daily as needed  . traMADol (ULTRAM) 50 MG tablet Take 1 tablet (50 mg total) by mouth every 6 (six) hours as needed for severe pain. For AFTER surgery only, do not take and drive   No facility-administered encounter medications on file as of 11/26/2020.    ALLERGIES:  Allergies  Allergen Reactions  . Gabapentin     REACTION: confusion  . Nitrofurantoin     REACTION: Hives     FAMILY HISTORY:  Family History  Problem Relation Age of Onset  . Breast cancer Mother 63  . Heart attack Father 44  . Heart disease Brother   . Heart attack Brother   . Colon cancer Neg Hx   . Liver disease Neg Hx   . Uterine cancer Neg Hx   . Ovarian cancer Neg Hx      SOCIAL HISTORY:  Social Connections: Moderately Isolated  . Frequency of  Communication with Friends and Family: More than three times a week  . Frequency of Social Gatherings with Friends and Family: Patient refused  . Attends Religious Services: Never  . Active Member of Clubs or Organizations: No  . Attends Archivist Meetings: Never  . Marital Status: Married    REVIEW OF SYSTEMS:  Pertinent positives include urinary incontinence, vaginal bleeding, difficulty walking, anxiety Denies appetite changes, fevers, chills, fatigue, unexplained weight changes. Denies hearing loss, neck lumps or masses, mouth sores, ringing in ears or voice changes. Denies cough or wheezing.  Denies shortness of breath. Denies chest pain or palpitations. Denies leg swelling. Denies abdominal distention, pain, blood in stools, constipation, diarrhea, nausea, vomiting, or early satiety. Denies pain with intercourse, dysuria, frequency, hematuria. Denies hot flashes, pelvic pain or vaginal discharge.   Denies joint pain, back pain or muscle pain/cramps. Denies itching, rash, or wounds. Denies dizziness, headaches, numbness or seizures. Denies swollen lymph nodes or  glands, denies easy bruising or bleeding. Denies depression, confusion, or decreased concentration.  Physical Exam:  Vital Signs for this encounter:  Blood pressure (!) 147/52, pulse 84, temperature (!) 96.8 F (36 C), temperature source Tympanic, resp. rate 20, height 5' 3"  (1.6 m), weight (!) 314 lb (142.4 kg), SpO2 98 %. Body mass index is 55.62 kg/m. General: Alert, oriented, no acute distress.  HEENT: Normocephalic, atraumatic. Sclera anicteric.  Chest: Clear to auscultation bilaterally. No wheezes, rhonchi, or rales. Cardiovascular: Regular rate and rhythm, no murmurs, rubs, or gallops.  Abdomen: Obese. Normoactive bowel sounds. Soft, nondistended, nontender to palpation. No masses or hepatosplenomegaly appreciated. No palpable fluid wave.  Extremities: Grossly normal range of motion. Warm, well perfused. 1+ edema bilaterally.  Skin: No rashes or lesions.  Lymphatics: No cervical, supraclavicular, or inguinal adenopathy.  GU:  Normal external female genitalia. No lesions. No discharge or bleeding.             Vagina: Well rugated, no lesions or masses.             Cervix: Normal appearing, no lesions.             Uterus: Somewhat difficult given body habitus.  10 cm, mobile, no parametrial involvement or nodularity.             Adnexa: No masses appreciated.  Rectal: No nodularity.  LABORATORY AND RADIOLOGIC DATA:  Outside medical records were reviewed to synthesize the above history, along with the history and physical obtained during the visit.   Lab Results  Component Value Date   WBC 6.6 02/21/2014   HGB 14.5 02/21/2014   HCT 42.7 02/21/2014   PLT 229 02/21/2014   GLUCOSE 140 (H) 02/21/2014   CHOL 173 02/20/2014   TRIG 169 (H) 02/20/2014   HDL 38 (L) 02/20/2014   LDLCALC 101 (H) 02/20/2014   ALT 16 02/20/2014   AST 12 02/20/2014   NA 140 02/21/2014   K 3.9 02/21/2014   CL 100 02/21/2014   CREATININE 0.64 02/21/2014   BUN 13 02/21/2014   CO2 25 02/21/2014   TSH  3.024 02/20/2014   INR 0.94 08/26/2011   HGBA1C 6.1 (A) 04/20/2011   Result Date: 11/18/2020 [facility] [place] [address] [place] [address]  GYNECOLOGIC SONOGRAM Patty Estrada is a 70 y.o. [id number] No LMP recorded. Patient is postmenopausal. She is here for a pelvic sonogram for postmenopausal bleeding. Uterus                       12.2 x 9.3 x 9.7 cm, Total uterine volume 572 cc,enlarged homogeneous anteverted uterus,wnl  Endometrium           42 mm, symmetrical, homogeneous thickened endometrium with color flow  Right ovary              2.4 x 2.9 x 2.3 cm, wnl,limited view  Left ovary                 3.2 x 2.4 x 2.6 cm, wnl,limited view No free fluid Technician  Comments: PELVIC US TV(only) unable to visualized uterus with TA probe because of pt body habitus, enlarged homogeneous anteverted uterus,wnl,homogeneous thickened endometrium with color flow,EEC 42 mm,normal ovaries (limited view),no free fluid,no pain during ultrasound Chaperone [address] Heide Guile 11/18/2020 3:58 PM Clinical Impression and recommendations: I have reviewed the sonogram results above, combined with the patient's current clinical course, below are my impressions and any appropriate recommendations for management based on the sonographic findings. Uterus is enlarged due to fibroids with prominent endometrium, already biopsy proven endometrial adenocarcinoma, suspicious for superficial involvement of the myometrium Endometrium significantly thickened at 42 mmwith homogenous appearance consistent with Grade 1 adenocarcinoma Ovaries are normal in size shape and contour Florian Buff 11/18/2020 4:08 PM   10/28/20: EMB FIGO Grade I endometrial adenocarinoma

## 2020-11-25 NOTE — H&P (View-Only) (Signed)
GYNECOLOGIC ONCOLOGY NEW PATIENT CONSULTATION   Patient Name: Patty Estrada  Patient Age: 70 y.o. Date of Service: 11/26/20 Referring Provider: Dr Tania Ade  Primary Care Provider: Asencion Noble, MD Consulting Provider: Jeral Pinch, MD   Assessment/Plan:  Postmenopausal patient with clinical stage I grade 1 endometrial endometrioid adenocarcinoma.  We reviewed the nature of endometrial cancer and its recommended surgical staging, including total hysterectomy, bilateral salpingo-oophorectomy, and lymph node assessment. The patient is a suitable candidate for staging via a minimally invasive approach to surgery.  We reviewed that robotic assistance would be used to complete the surgery.   Given her weight and pulmonary comorbidities, we discussed that she may not be able to tolerate Trendelenburg or Trendelenburg with insufflation needed for robotic surgery.  If it is not appear to be safe to proceed with robotic staging, then my recommendation is that we perform a D&C with Mirena IUD placement for treatment of her uterine cancer while we work more aggressively on weight loss.  We discussed that most endometrial cancer is detected early and that decisions regarding adjuvant therapy will be made based on her final pathology.   We reviewed the sentinel lymph node technique. Risks and benefits of sentinel lymph node biopsy was reviewed. We reviewed the technique and ICG dye. The patient DOES NOT have an iodine allergy or known liver dysfunction. We reviewed the false negative rate (0.4%), and that 3% of patients with metastatic disease will not have it detected by SLN biopsy in endometrial cancer. A low risk of allergic reaction to the dye, <0.2% for ICG, has been reported. We also discussed that in the case of failed mapping, which occurs 40% of the time, a bilateral or unilateral lymphadenectomy will be performed at the surgeon's discretion.   Potential benefits of sentinel nodes including a  higher detection rate for metastasis due to ultrastaging and potential reduction in operative morbidity. However, there remains uncertainty as to the role for treatment of micrometastatic disease. Further, the benefit of operative morbidity associated with the SLN technique in endometrial cancer is not yet completely known. In other patient populations (e.g. the cervical cancer population) there has been observed reductions in morbidity with SLN biopsy compared to pelvic lymphadenectomy. Lymphedema, nerve dysfunction and lymphocysts are all potential risks with the SLN technique as with complete lymphadenectomy. Additional risks to the patient include the risk of damage to an internal organ while operating in an altered view (e.g. the black and white image of the robotic fluorescence imaging mode).   We discussed the plan for a robotic assisted hysterectomy, bilateral salpingo-oophorectomy, sentinel lymph node evaluation, possible lymph node dissection, possible laparotomy, possible D&C at the start of surgery for uterine compression, possible D&C with Mirena IUD if patient unable to tolerate Trendelenburg. The risks of surgery were discussed in detail and she understands these to include infection; wound separation; hernia; vaginal cuff separation, injury to adjacent organs such as bowel, bladder, blood vessels, ureters and nerves; bleeding which may require blood transfusion; anesthesia risk; thromboembolic events; possible death; unforeseen complications; possible need for re-exploration; medical complications such as heart attack, stroke, pleural effusion and pneumonia; and, if full lymphadenectomy is performed the risk of lymphedema and lymphocyst. The patient will receive DVT and antibiotic prophylaxis as indicated. She voiced a clear understanding. She had the opportunity to ask questions. Perioperative instructions were reviewed with her. Prescriptions for post-op medications were sent to her pharmacy of  choice.  I discussed with the patient the role that excess estrogen plays  in the pathogenesis of endometrioid endometrial adenocarcinoma.  She is committed to continuing to work on weight loss.  She has found a weight management program in [facility] and is going to reach out for consultation.  A copy of this note was sent to the patient's referring provider.   65 minutes of total time was spent for this patient encounter, including preparation, face-to-face counseling with the patient and coordination of care, and documentation of the encounter.  Jeral Pinch, MD  Division of Gynecologic Oncology  Department of Obstetrics and Gynecology  [facility]  ___________________________________________  Chief Complaint: Chief Complaint  Patient presents with  . Endometrial adenocarcinoma (HCC)    History of Present Illness:  Patty Estrada is a 70 y.o. y.o. female who is seen in consultation at the request of Dr. Elonda Husky for an evaluation of newly diagnosed endometrial cancer.  Patient endorses going through menopause at approximately age 74.  In 2015, she presented with vaginal bleeding.  In August of that year she underwent a D&C with findings of an endocervical as well as endometrial polyp.  There was no atypia or hyperplasia noted on final pathology.  Her bleeding then stopped completely.  At the beginning of COVID, she started having very light vaginal bleeding.  It progressively increased although it is heavy as she describes using 1 or 2 ultrathin pads a day which were not saturated.  She would have intermittent episodes where she would stop bleeding completely, others where she would have dark and old appearing blood, and other times where she would have bright red bleeding.  Denies any associated pelvic pain or cramping.  Since her biopsy on the 25th, she had more significant bleeding immediately after and has since stopped having any bleeding.  She endorses  a good appetite without any nausea or emesis.  She reports regular bowel function.  She has some urinary incontinence which she describes as leaking when she bends or has position changes.  This is new in the last couple of months.  She endorses a medical history of obesity, hypertension, hyperlipidemia, depression, arthritis, and prediabetes.  Her hemoglobin A1c in February was 6.1%.  She denies any shortness of breath or chest pain with ambulation.  She uses a cane for walking assistance secondary to arthritis and related pain and balance issues.  She may have sleep apnea although when she went for sleep study, she could not sleep and they were unable to diagnose sleep apnea.  The patient weighed 400 pounds at her heaviest and over a number of years has been able to lose approximately 100 pounds.  The patient lives in [place] with her husband and one of her grown daughters.  PAST MEDICAL HISTORY:  Past Medical History:  Diagnosis Date  . Adenomatous colon polyp 2006      . Arthritis   . Atrial fibrillation ([place]) 2013  . Depression   . Endometrial cancer ([place])   . Fatty liver    ?cirrhosis on u/s in 2008  . GERD (gastroesophageal reflux disease)   . HTN (hypertension)   . Hyperglycemia   . Hyperlipidemia   . NASH (nonalcoholic steatohepatitis)   . Obesity   . Urinary incontinence   . Vitamin D deficiency      PAST SURGICAL HISTORY:  Past Surgical History:  Procedure Laterality Date  . COLONOSCOPY  04/09/2005   adenomatous polyps, two diverticula  . COLONOSCOPY  12/2010   RMR: left-sided diverticula, adenomatous polyps, next TCS due 12/2015  .  DILATION AND CURETTAGE OF UTERUS  2015  . ESOPHAGOGASTRODUODENOSCOPY  2006   hyperplastic polyps, hh  . ESOPHAGOGASTRODUODENOSCOPY  12/2010   RMR:hh, multiple benign gastric polyps,   . HYSTEROSCOPY  02/27/2014   Procedure: HYSTEROSCOPY;  Surgeon: Jonnie Kind, MD;  Location: [facility];  Service: Gynecology;;  . POLYPECTOMY N/A 02/27/2014    Procedure: REMOVAL OF ENDOCERVICAL POLYP AND ENDOMETRIAL POLYPS;  Surgeon: Jonnie Kind, MD;  Location: [facility];  Service: Gynecology;  Laterality: N/A;  . TUBAL LIGATION      OB/GYN HISTORY:  OB History  Gravida Para Term Preterm AB Living  2 2 2  0 0 2  SAB IAB Ectopic Multiple Live Births  0 0 0 0 2    # Outcome Date GA Lbr Len/2nd Weight Sex Delivery Anes PTL Lv  2 Term           1 Term             No LMP recorded. Patient is postmenopausal.  Age at menarche: 29 Age at menopause: 58 Hx of HRT: Denies Hx of STDs: Denies Last pap: 2010 approximately History of abnormal pap smears: Denies  SCREENING STUDIES:  Last mammogram: Does not remember but thinks maybe 2010.  There is one ordered although it has not been scheduled.  Last colonoscopy: 2012  MEDICATIONS: Outpatient Encounter Medications as of 11/26/2020  Medication Sig  . acetaminophen (TYLENOL) 650 MG CR tablet Take 1,300 mg by mouth daily as needed for pain.  Marland Kitchen atenolol (TENORMIN) 50 MG tablet Take 50 mg by mouth daily.  Marland Kitchen atorvastatin (LIPITOR) 40 MG tablet Take 40 mg by mouth daily.  . benazepril (LOTENSIN) 40 MG tablet Take 40 mg by mouth daily.  . diclofenac sodium (VOLTAREN) 1 % GEL Apply 1 application topically as needed. Uses 1-2 times/day for arthritic knee pain  . methocarbamol (ROBAXIN) 500 MG tablet Take 500 mg by mouth as needed.  . mirtazapine (REMERON) 15 MG tablet Take 7.5 mg by mouth at bedtime.  Marland Kitchen OVER THE COUNTER MEDICATION Take 2 capsules by mouth daily. Omega 3/ALA/Chromium  . polyethylene glycol powder (GLYCOLAX/MIRALAX) powder MIX 1 CAPFUL IN LIQUID AT BEDTIME AS NEEDED FOR CONSTIPATION.  . Probiotic Product (PROBIOTIC DAILY) CAPS Take by mouth.  . senna-docusate (SENOKOT-S) 8.6-50 MG tablet Take 2 tablets by mouth at bedtime. For AFTER surgery, do not take if having diarrhea  . sodium-potassium bicarbonate (ALKA-SELTZER GOLD) TBEF dissolvable tablet Take 2 tablets by mouth as needed.  .  traMADol (ULTRAM) 50 MG tablet Take by mouth every 6 (six) hours as needed. One daily as needed  . traMADol (ULTRAM) 50 MG tablet Take 1 tablet (50 mg total) by mouth every 6 (six) hours as needed for severe pain. For AFTER surgery only, do not take and drive   No facility-administered encounter medications on file as of 11/26/2020.    ALLERGIES:  Allergies  Allergen Reactions  . Gabapentin     REACTION: confusion  . Nitrofurantoin     REACTION: Hives     FAMILY HISTORY:  Family History  Problem Relation Age of Onset  . Breast cancer Mother 32  . Heart attack Father 66  . Heart disease Brother   . Heart attack Brother   . Colon cancer Neg Hx   . Liver disease Neg Hx   . Uterine cancer Neg Hx   . Ovarian cancer Neg Hx      SOCIAL HISTORY:  Social Connections: Moderately Isolated  . Frequency of  Communication with Friends and Family: More than three times a week  . Frequency of Social Gatherings with Friends and Family: Patient refused  . Attends Religious Services: Never  . Active Member of Clubs or Organizations: No  . Attends Archivist Meetings: Never  . Marital Status: Married    REVIEW OF SYSTEMS:  Pertinent positives include urinary incontinence, vaginal bleeding, difficulty walking, anxiety Denies appetite changes, fevers, chills, fatigue, unexplained weight changes. Denies hearing loss, neck lumps or masses, mouth sores, ringing in ears or voice changes. Denies cough or wheezing.  Denies shortness of breath. Denies chest pain or palpitations. Denies leg swelling. Denies abdominal distention, pain, blood in stools, constipation, diarrhea, nausea, vomiting, or early satiety. Denies pain with intercourse, dysuria, frequency, hematuria. Denies hot flashes, pelvic pain or vaginal discharge.   Denies joint pain, back pain or muscle pain/cramps. Denies itching, rash, or wounds. Denies dizziness, headaches, numbness or seizures. Denies swollen lymph nodes or  glands, denies easy bruising or bleeding. Denies depression, confusion, or decreased concentration.  Physical Exam:  Vital Signs for this encounter:  Blood pressure (!) 147/52, pulse 84, temperature (!) 96.8 F (36 C), temperature source Tympanic, resp. rate 20, height 5' 3"  (1.6 m), weight (!) 314 lb (142.4 kg), SpO2 98 %. Body mass index is 55.62 kg/m. General: Alert, oriented, no acute distress.  HEENT: Normocephalic, atraumatic. Sclera anicteric.  Chest: Clear to auscultation bilaterally. No wheezes, rhonchi, or rales. Cardiovascular: Regular rate and rhythm, no murmurs, rubs, or gallops.  Abdomen: Obese. Normoactive bowel sounds. Soft, nondistended, nontender to palpation. No masses or hepatosplenomegaly appreciated. No palpable fluid wave.  Extremities: Grossly normal range of motion. Warm, well perfused. 1+ edema bilaterally.  Skin: No rashes or lesions.  Lymphatics: No cervical, supraclavicular, or inguinal adenopathy.  GU:  Normal external female genitalia. No lesions. No discharge or bleeding.             Vagina: Well rugated, no lesions or masses.             Cervix: Normal appearing, no lesions.             Uterus: Somewhat difficult given body habitus.  10 cm, mobile, no parametrial involvement or nodularity.             Adnexa: No masses appreciated.  Rectal: No nodularity.  LABORATORY AND RADIOLOGIC DATA:  Outside medical records were reviewed to synthesize the above history, along with the history and physical obtained during the visit.   Lab Results  Component Value Date   WBC 6.6 02/21/2014   HGB 14.5 02/21/2014   HCT 42.7 02/21/2014   PLT 229 02/21/2014   GLUCOSE 140 (H) 02/21/2014   CHOL 173 02/20/2014   TRIG 169 (H) 02/20/2014   HDL 38 (L) 02/20/2014   LDLCALC 101 (H) 02/20/2014   ALT 16 02/20/2014   AST 12 02/20/2014   NA 140 02/21/2014   K 3.9 02/21/2014   CL 100 02/21/2014   CREATININE 0.64 02/21/2014   BUN 13 02/21/2014   CO2 25 02/21/2014   TSH  3.024 02/20/2014   INR 0.94 08/26/2011   HGBA1C 6.1 (A) 04/20/2011   Result Date: 11/18/2020 [facility] [place] [address] [place] [id number]  GYNECOLOGIC SONOGRAM Lakia Gritton is a 70 y.o. [id number] No LMP recorded. Patient is postmenopausal. She is here for a pelvic sonogram for postmenopausal bleeding. Uterus                       12.2 x 9.3 x 9.7 cm, Total uterine volume 572 cc,enlarged homogeneous anteverted uterus,wnl  Endometrium           42 mm, symmetrical, homogeneous thickened endometrium with color flow  Right ovary              2.4 x 2.9 x 2.3 cm, wnl,limited view  Left ovary                 3.2 x 2.4 x 2.6 cm, wnl,limited view No free fluid Technician  Comments: PELVIC US TV(only) unable to visualized uterus with TA probe because of pt body habitus, enlarged homogeneous anteverted uterus,wnl,homogeneous thickened endometrium with color flow,EEC 42 mm,normal ovaries (limited view),no free fluid,no pain during ultrasound Chaperone [address] Heide Guile 11/18/2020 3:58 PM Clinical Impression and recommendations: I have reviewed the sonogram results above, combined with the patient's current clinical course, below are my impressions and any appropriate recommendations for management based on the sonographic findings. Uterus is enlarged due to fibroids with prominent endometrium, already biopsy proven endometrial adenocarcinoma, suspicious for superficial involvement of the myometrium Endometrium significantly thickened at 42 mmwith homogenous appearance consistent with Grade 1 adenocarcinoma Ovaries are normal in size shape and contour Florian Buff 11/18/2020 4:08 PM   10/28/20: EMB FIGO Grade I endometrial adenocarinoma

## 2020-11-26 ENCOUNTER — Inpatient Hospital Stay: Payer: Medicare Other | Attending: Gynecologic Oncology | Admitting: Gynecologic Oncology

## 2020-11-26 ENCOUNTER — Encounter: Payer: Self-pay | Admitting: Gynecologic Oncology

## 2020-11-26 ENCOUNTER — Other Ambulatory Visit: Payer: Self-pay

## 2020-11-26 VITALS — BP 147/52 | HR 84 | Temp 96.8°F | Resp 20 | Ht 63.0 in | Wt 314.0 lb

## 2020-11-26 DIAGNOSIS — Z791 Long term (current) use of non-steroidal anti-inflammatories (NSAID): Secondary | ICD-10-CM | POA: Insufficient documentation

## 2020-11-26 DIAGNOSIS — F32A Depression, unspecified: Secondary | ICD-10-CM | POA: Diagnosis not present

## 2020-11-26 DIAGNOSIS — I10 Essential (primary) hypertension: Secondary | ICD-10-CM | POA: Diagnosis not present

## 2020-11-26 DIAGNOSIS — C541 Malignant neoplasm of endometrium: Secondary | ICD-10-CM | POA: Diagnosis not present

## 2020-11-26 DIAGNOSIS — E669 Obesity, unspecified: Secondary | ICD-10-CM | POA: Diagnosis not present

## 2020-11-26 DIAGNOSIS — K7581 Nonalcoholic steatohepatitis (NASH): Secondary | ICD-10-CM | POA: Diagnosis not present

## 2020-11-26 DIAGNOSIS — E785 Hyperlipidemia, unspecified: Secondary | ICD-10-CM | POA: Diagnosis not present

## 2020-11-26 DIAGNOSIS — Z79899 Other long term (current) drug therapy: Secondary | ICD-10-CM | POA: Diagnosis not present

## 2020-11-26 DIAGNOSIS — I4891 Unspecified atrial fibrillation: Secondary | ICD-10-CM | POA: Diagnosis not present

## 2020-11-26 MED ORDER — TRAMADOL HCL 50 MG PO TABS: 50.0000 mg | ORAL_TABLET | Freq: Four times a day (QID) | ORAL | 0 refills | Status: DC | PRN

## 2020-11-26 MED ORDER — SENNOSIDES-DOCUSATE SODIUM 8.6-50 MG PO TABS: 2.0000 | ORAL_TABLET | Freq: Every day | ORAL | 0 refills | Status: DC

## 2020-11-26 NOTE — Patient Instructions (Addendum)
Preparing for your Surgery  Plan for surgery on December 19, 2020 with Dr. Jeral Pinch at [place] will be scheduled for a robotic assisted total laparoscopic hysterectomy (removal of the uterus and cervix), bilateral salpingo-oophorectomy (removal of both ovaries and fallopian tubes), possible sentinel lymph node biopsy, possible lymph node dissection, possible laparotomy, possible dilation and curettage of the uterus with Mirena intrauterine device placement.   Pre-operative Testing -You will receive a phone call from presurgical testing at [facility] to arrange for a pre-operative appointment, labs.  -Bring your insurance card, copy of an advanced directive if applicable, medication list  -At that visit, you will be asked to sign a consent for a possible blood transfusion in case a transfusion becomes necessary during surgery.  The need for a blood transfusion is rare but having consent is a necessary part of your care.     -You should not be taking blood thinners or aspirin at least ten days prior to surgery unless instructed by your surgeon.  -Do not take supplements such as fish oil (omega 3), red yeast rice, turmeric before your surgery. You want to avoid medications with aspirin in them including headache powders such as BC or Goody's), Excedrin migraine.  Day Before Surgery at [place] will be asked to take in a light diet the day before surgery. You will be advised you can have clear liquids up until 3 hours before your surgery.    Eat a light diet the day before surgery.  Examples including soups, broths, toast, yogurt, mashed potatoes.  AVOID GAS PRODUCING FOODS. Things to avoid include carbonated beverages (fizzy beverages, sodas), raw fruits and raw vegetables (uncooked), or beans.   If your bowels are filled with gas, your surgeon will have difficulty visualizing your pelvic organs which increases your surgical risks.  Your role in recovery Your role  is to become active as soon as directed by your doctor, while still giving yourself time to heal.  Rest when you feel tired. You will be asked to do the following in order to speed your recovery:  - Cough and breathe deeply. This helps to clear and expand your lungs and can prevent pneumonia after surgery.  - [place]. Do mild physical activity. Walking or moving your legs help your circulation and body functions return to normal. Do not try to get up or walk alone the first time after surgery.   -If you develop swelling on one leg or the other, pain in the back of your leg, redness/warmth in one of your legs, please call the office or go to the Emergency Room to have a doppler to rule out a blood clot. For shortness of breath, chest pain-seek care in the Emergency Room as soon as possible. - Actively manage your pain. Managing your pain lets you move in comfort. We will ask you to rate your pain on a scale of zero to 10. It is your responsibility to tell your doctor or nurse where and how much you hurt so your pain can be treated.  Special Considerations -If you are diabetic, you may be placed on insulin after surgery to have closer control over your blood sugars to promote healing and recovery.  This does not mean that you will be discharged on insulin.  If applicable, your oral antidiabetics will be resumed when you are tolerating a solid diet.  -Your final pathology results from surgery should be available around one week after  surgery and the results will be relayed to you when available.  -Dr. Lahoma Crocker is the surgeon that assists your GYN Oncologist with surgery.  If you end up staying the night, the next day after your surgery you will either see Dr. Denman George, Dr. Berline Lopes, or Dr. Lahoma Crocker.  -FMLA forms can be faxed to [phone] and please allow 5-7 business days for completion.  Pain Management After Surgery -You have been prescribed your pain  medication and bowel regimen medications before surgery so that you can have these available when you are discharged from the hospital. The pain medication is for use ONLY AFTER surgery and a new prescription will not be given.   -Make sure that you have Tylenol and Ibuprofen at home to use on a regular basis after surgery for pain control. We recommend alternating the medications every hour to six hours since they work differently and are processed in the body differently for pain relief.  -Review the attached handout on narcotic use and their risks and side effects.   Bowel Regimen -You have been prescribed Sennakot-S to take nightly to prevent constipation especially if you are taking the narcotic pain medication intermittently.  It is important to prevent constipation and drink adequate amounts of liquids. You can stop taking this medication when you are not taking pain medication and you are back on your normal bowel routine.  Risks of Surgery Risks of surgery are low but include bleeding, infection, damage to surrounding structures, re-operation, blood clots, and very rarely death.   Blood Transfusion Information (For the consent to be signed before surgery)  We will be checking your blood type before surgery so in case of emergencies, we will know what type of blood you would need.                                            WHAT IS A BLOOD TRANSFUSION?  A transfusion is the replacement of blood or some of its parts. Blood is made up of multiple cells which provide different functions.  Red blood cells carry oxygen and are used for blood loss replacement.  White blood cells fight against infection.  Platelets control bleeding.  Plasma helps clot blood.  Other blood products are available for specialized needs, such as hemophilia or other clotting disorders. BEFORE THE TRANSFUSION  Who gives blood for transfusions?   You may be able to donate blood to be used at a later date on  yourself (autologous donation).  Relatives can be asked to donate blood. This is generally not any safer than if you have received blood from a stranger. The same precautions are taken to ensure safety when a relative's blood is donated.  Healthy volunteers who are fully evaluated to make sure their blood is safe. This is blood bank blood. Transfusion therapy is the safest it has ever been in the practice of medicine. Before blood is taken from a donor, a complete history is taken to make sure that person has no history of diseases nor engages in risky social behavior (examples are intravenous drug use or sexual activity with multiple partners). The donor's travel history is screened to minimize risk of transmitting infections, such as malaria. The donated blood is tested for signs of infectious diseases, such as HIV and hepatitis. The blood is then tested to be sure it is compatible with you in  order to minimize the chance of a transfusion reaction. If you or a relative donates blood, this is often done in anticipation of surgery and is not appropriate for emergency situations. It takes many days to process the donated blood. RISKS AND COMPLICATIONS Although transfusion therapy is very safe and saves many lives, the main dangers of transfusion include:   Getting an infectious disease.  Developing a transfusion reaction. This is an allergic reaction to something in the blood you were given. Every precaution is taken to prevent this. The decision to have a blood transfusion has been considered carefully by your caregiver before blood is given. Blood is not given unless the benefits outweigh the risks.  AFTER SURGERY INSTRUCTIONS  Return to work: 6 weeks if applicable  Activity: 1. Be up and out of the bed during the day.  Take a nap if needed.  You may walk up steps but be careful and use the hand rail.  Stair climbing will tire you more than you think, you may need to stop part way and rest.    2. No lifting or straining for 6 weeks over 10 pounds. No pushing, pulling, straining for 6 weeks.  3. No driving for around 1 week(s).  Do not drive if you are taking narcotic pain medicine and make sure that your reaction time has returned.   4. You can shower as soon as the next day after surgery. Shower daily.  Use your regular soap and water (not directly on the incision) and pat your incision(s) dry afterwards; don't rub.  No tub baths or submerging your body in water until cleared by your surgeon. If you have the soap that was given to you by pre-surgical testing that was used before surgery, you do not need to use it afterwards because this can irritate your incisions.   5. No sexual activity and nothing in the vagina for 8 weeks.  6. You may experience a small amount of clear drainage from your incisions, which is normal.  If the drainage persists, increases, or changes color please call the office.  7. Do not use creams, lotions, or ointments such as neosporin on your incisions after surgery until advised by your surgeon because they can cause removal of the dermabond glue on your incisions.    8. You may experience vaginal spotting after surgery or around the 6-8 week mark from surgery when the stitches at the top of the vagina begin to dissolve.  The spotting is normal but if you experience heavy bleeding, call our office.  9. Take Tylenol or ibuprofen first for pain and only use narcotic pain medication for severe pain not relieved by the Tylenol or Ibuprofen.  Monitor your Tylenol intake to a max of 4,000 mg in a 24 hour period. You can alternate these medications after surgery.  Diet: 1. Low sodium Heart Healthy Diet is recommended but you are cleared to resume your normal (before surgery) diet after your procedure.  2. It is safe to use a laxative, such as Miralax or Colace, if you have difficulty moving your bowels. You have been prescribed Sennakot at bedtime every evening to  keep bowel movements regular and to prevent constipation.    Wound Care: 1. Keep clean and dry.  Shower daily.  Reasons to call the Doctor:  Fever - Oral temperature greater than 100.4 degrees Fahrenheit  Foul-smelling vaginal discharge  Difficulty urinating  Nausea and vomiting  Increased pain at the site of the incision that is  unrelieved with pain medicine.  Difficulty breathing with or without chest pain  New calf pain especially if only on one side  Sudden, continuing increased vaginal bleeding with or without clots.   Contacts: For questions or concerns you should contact:  Dr. Jeral Pinch at [phone]  Joylene John, NP at [phone]  After Hours: call [phone] and have the GYN Oncologist paged/contacted (after 5 pm or on the weekends).  Messages sent via mychart are for non-urgent matters and are not responded to after hours so for urgent needs, please call the after hours number.

## 2020-11-28 ENCOUNTER — Telehealth: Payer: Self-pay | Admitting: *Deleted

## 2020-11-28 NOTE — Telephone Encounter (Signed)
Fax records and surgical stratification/optimization form to the patient's PCP

## 2020-12-03 ENCOUNTER — Telehealth: Payer: Self-pay | Admitting: *Deleted

## 2020-12-03 NOTE — Telephone Encounter (Signed)
Called Dr Ria Comment office regarding the stratification form, they are working on it

## 2020-12-16 NOTE — Patient Instructions (Signed)
DUE TO COVID-19 ONLY ONE VISITOR IS ALLOWED TO COME WITH YOU AND STAY IN THE WAITING ROOM ONLY DURING PRE OP AND PROCEDURE DAY OF SURGERY. THE 1 VISITOR  MAY VISIT WITH YOU AFTER SURGERY IN YOUR PRIVATE ROOM DURING VISITING HOURS ONLY!                Patty Estrada     Your procedure is scheduled on: 12/19/20   Report to [facility] Main  Entrance   Report to short stay at 5:15 AM     Call this number if you have problems the morning of surgery [place], NO CHEWING GUM [place].  Eat a light diet the day before surgery.   Examples including soups, broths, toast, yogurt, mashed potatoes.   Things to avoid include carbonated beverages (fizzy beverages), raw fruits and raw vegetables, or beans.   If your bowels are filled with gas, your surgeon will have difficulty visualizing your pelvic organs which increases your surgical risks.    No food after midnight. You may have clear liquid until 4:30 AM   Take these medicines the morning of surgery with A SIP OF WATER: Atenolol                                 You may not have any metal on your body including hair pins and              piercings  Do not wear jewelry, make-up, lotions, powders or perfumes, deodorant             Do not wear nail polish on your fingernails.  Do not shave  48 hours prior to surgery.                 Do not bring valuables to the hospital. [place].  Contacts, dentures or bridgework may not be worn into surgery.       Patients discharged the day of surgery will not be allowed to drive home.   IF YOU ARE HAVING SURGERY AND GOING HOME THE SAME DAY, YOU MUST HAVE AN ADULT TO DRIVE YOU HOME AND BE WITH YOU FOR 24 HOURS.  YOU MAY GO HOME BY TAXI OR UBER OR ORTHERWISE, BUT AN ADULT MUST ACCOMPANY YOU HOME AND STAY WITH YOU FOR 24 HOURS.  Name and phone number of your  driver:  Special Instructions: N/A              Please read over the following fact sheets you were given: _____________________________________________________________________             [facility] - Preparing for Surgery Before surgery, you can play an important role.  Because skin is not sterile, your skin needs to be as free of germs as possible.  You can reduce the number of germs on your skin by washing with CHG (chlorahexidine gluconate) soap before surgery.  CHG is an antiseptic cleaner which kills germs and bonds with the skin to continue killing germs even after washing. Please DO NOT use if you have an allergy to CHG or antibacterial soaps.  If your skin becomes reddened/irritated stop using the CHG and inform your nurse when you arrive at Short Stay. Do not  shave (including legs and underarms) for at least 48 hours prior to the first CHG shower.    Please follow these instructions carefully:  1.  Shower with CHG Soap the night before surgery and the  morning of Surgery.  2.  If you choose to wash your hair, wash your hair first as usual with your  normal  shampoo.  3.  After you shampoo, rinse your hair and body thoroughly to remove the  shampoo.                                        4.  Use CHG as you would any other liquid soap.  You can apply chg directly  to the skin and wash                       Gently with a scrungie or clean washcloth.  5.  Apply the CHG Soap to your body ONLY FROM THE NECK DOWN.   Do not use on face/ open                           Wound or open sores. Avoid contact with eyes, ears mouth and genitals (private parts).                       Wash face,  Genitals (private parts) with your normal soap.             6.  Wash thoroughly, paying special attention to the area where your surgery  will be performed.  7.  Thoroughly rinse your body with warm water from the neck down.  8.  DO NOT shower/wash with your normal soap after using and rinsing off  the CHG  Soap.             9.  Pat yourself dry with a clean towel.            10.  Wear clean pajamas.            11.  Place clean sheets on your bed the night of your first shower and do not  sleep with pets. Day of Surgery : Do not apply any lotions/deodorants the morning of surgery.  Please wear clean clothes to the hospital/surgery center.  FAILURE TO FOLLOW THESE INSTRUCTIONS MAY RESULT IN THE CANCELLATION OF YOUR SURGERY PATIENT SIGNATURE_________________________________  NURSE SIGNATURE__________________________________  ________________________________________________________________________

## 2020-12-17 ENCOUNTER — Encounter (HOSPITAL_COMMUNITY)
Admission: RE | Admit: 2020-12-17 | Discharge: 2020-12-17 | Disposition: A | Discharge status: Home / Self Care | Payer: Medicare Other | Source: Ambulatory Visit | Attending: Gynecologic Oncology | Admitting: Gynecologic Oncology

## 2020-12-17 ENCOUNTER — Encounter (HOSPITAL_COMMUNITY): Payer: Self-pay

## 2020-12-17 ENCOUNTER — Other Ambulatory Visit: Payer: Self-pay

## 2020-12-17 DIAGNOSIS — Z01818 Encounter for other preprocedural examination: Secondary | ICD-10-CM | POA: Diagnosis not present

## 2020-12-17 HISTORY — DX: Prediabetes: R73.03

## 2020-12-17 LAB — COMPREHENSIVE METABOLIC PANEL
ALT: 17 U/L (ref 0–44)
AST: 14 U/L — ABNORMAL LOW (ref 15–41)
Albumin: 4.3 g/dL (ref 3.5–5.0)
Alkaline Phosphatase: 83 U/L (ref 38–126)
Anion gap: 9 (ref 5–15)
BUN: 14 mg/dL (ref 8–23)
CO2: 24 mmol/L (ref 22–32)
Calcium: 9.9 mg/dL (ref 8.9–10.3)
Chloride: 105 mmol/L (ref 98–111)
Creatinine, Ser: 0.64 mg/dL (ref 0.44–1.00)
GFR, Estimated: >60 mL/min (ref >60)
Glucose, Bld: 123 mg/dL — ABNORMAL HIGH (ref 70–99)
Potassium: 4.1 mmol/L (ref 3.5–5.1)
Sodium: 138 mmol/L (ref 135–145)
Total Bilirubin: 0.6 mg/dL (ref 0.3–1.2)
Total Protein: 8 g/dL (ref 6.5–8.1)

## 2020-12-17 LAB — URINALYSIS, ROUTINE W REFLEX MICROSCOPIC
Bacteria, UA: NONE SEEN
Bilirubin Urine: NEGATIVE
Glucose, UA: NEGATIVE mg/dL
Ketones, ur: NEGATIVE mg/dL
Nitrite: NEGATIVE
Protein, ur: NEGATIVE mg/dL
RBC / HPF: >50 RBC/hpf — ABNORMAL HIGH (ref 0–5)
Specific Gravity, Urine: 1.014 (ref 1.005–1.030)
pH: 5 (ref 5.0–8.0)

## 2020-12-17 LAB — CBC
HCT: 48.1 % — ABNORMAL HIGH (ref 36.0–46.0)
Hemoglobin: 15.7 g/dL — ABNORMAL HIGH (ref 12.0–15.0)
MCH: 31.5 pg (ref 26.0–34.0)
MCHC: 32.6 g/dL (ref 30.0–36.0)
MCV: 96.6 fL (ref 80.0–100.0)
Platelets: 310 10*3/uL (ref 150–400)
RBC: 4.98 MIL/uL (ref 3.87–5.11)
RDW: 12.6 % (ref 11.5–15.5)
WBC: 13 10*3/uL — ABNORMAL HIGH (ref 4.0–10.5)
nRBC: 0 % (ref 0.0–0.2)

## 2020-12-17 NOTE — Progress Notes (Signed)
COVID Vaccine Completed:Yes Date COVID Vaccine completed:09/16/19, booster 05/31/20 COVID vaccine manufacturer:  Moderna     PCP - Dr. Salena Saner Cardiologist - none  Chest x-ray - no EKG - 12/17/20-chart Stress Test - no ECHO - 04/12/12-epic Cardiac Cath - no Pacemaker/ICD device last checked:NA  Sleep Study - yes CPAP - no she uses a chin strap  Fasting Blood Sugar - NA Checks Blood Sugar _____ times a day  Blood Thinner Instructions:NA Aspirin Instructions: Last Dose:  Anesthesia review:   Patient denies shortness of breath, fever, cough and chest pain at PAT appointment Yes. Pt has SOB with activity. She lost 100 lbs and now is 310 lbs.  Patient verbalized understanding of instructions that were given to them at the PAT appointment. Patient was also instructed that they will need to review over the PAT instructions again at home before surgery. yes

## 2020-12-18 ENCOUNTER — Telehealth: Payer: Self-pay

## 2020-12-18 LAB — HEMOGLOBIN A1C
Hgb A1c MFr Bld: 6.1 % — ABNORMAL HIGH (ref 4.8–5.6)
Mean Plasma Glucose: 128 mg/dL

## 2020-12-18 MED ORDER — CEFAZOLIN IN SODIUM CHLORIDE 3-0.9 GM/100ML-% IV SOLN
3.0000 g | INTRAVENOUS | Status: AC
  Administered 2020-12-19: 3 g via INTRAVENOUS
  Filled 2020-12-18: qty 100

## 2020-12-18 NOTE — Anesthesia Preprocedure Evaluation (Addendum)
Anesthesia Evaluation  Patient identified by MRN, date of birth, ID band Patient awake    Reviewed: Allergy & Precautions, H&P , NPO status , Patient's Chart, lab work & pertinent test results, reviewed documented beta blocker date and time   Airway Mallampati: III  TM Distance: >3 FB Neck ROM: Full    Dental no notable dental hx. (+) Teeth Intact, Dental Advisory Given   Pulmonary neg pulmonary ROS, former smoker,    Pulmonary exam normal breath sounds clear to auscultation       Cardiovascular Exercise Tolerance: Good hypertension, Pt. on medications and Pt. on home beta blockers + dysrhythmias Atrial Fibrillation  Rhythm:Regular Rate:Normal     Neuro/Psych Depression negative neurological ROS     GI/Hepatic Neg liver ROS, GERD  ,  Endo/Other  Morbid obesity  Renal/GU negative Renal ROS  negative genitourinary   Musculoskeletal  (+) Arthritis , Osteoarthritis,    Abdominal   Peds  Hematology negative hematology ROS (+)   Anesthesia Other Findings   Reproductive/Obstetrics negative OB ROS                            Anesthesia Physical Anesthesia Plan  ASA: 3  Anesthesia Plan: General   Post-op Pain Management:    Induction: Intravenous  PONV Risk Score and Plan: 4 or greater and Ondansetron, Dexamethasone and Midazolam  Airway Management Planned: Oral ETT  Additional Equipment: Arterial line  Intra-op Plan:   Post-operative Plan: Extubation in OR  Informed Consent: I have reviewed the patients History and Physical, chart, labs and discussed the procedure including the risks, benefits and alternatives for the proposed anesthesia with the patient or authorized representative who has indicated his/her understanding and acceptance.     Dental advisory given  Plan Discussed with: CRNA  Anesthesia Plan Comments:        Anesthesia Quick Evaluation

## 2020-12-18 NOTE — Telephone Encounter (Signed)
Telephone call to check on pre-operative status.  Patient compliant with pre-operative instructions.  Reinforced NPO after midnight. Will take atenolol in the morning with a sip of water. No questions or concerns voiced.  Instructed to call for any needs.

## 2020-12-19 ENCOUNTER — Encounter (HOSPITAL_COMMUNITY)
Admission: RE | Disposition: A | Discharge status: Home / Self Care | Payer: Self-pay | Source: Home / Self Care | Attending: Gynecologic Oncology

## 2020-12-19 ENCOUNTER — Ambulatory Visit (HOSPITAL_COMMUNITY): Payer: Medicare Other | Admitting: Anesthesiology

## 2020-12-19 ENCOUNTER — Ambulatory Visit (HOSPITAL_COMMUNITY): Payer: Medicare Other | Admitting: Physician Assistant

## 2020-12-19 ENCOUNTER — Ambulatory Visit (HOSPITAL_COMMUNITY)
Admission: RE | Admit: 2020-12-19 | Discharge: 2020-12-19 | Disposition: A | Discharge status: Home / Self Care | Payer: Medicare Other | Attending: Gynecologic Oncology | Admitting: Gynecologic Oncology

## 2020-12-19 ENCOUNTER — Encounter (HOSPITAL_COMMUNITY): Payer: Self-pay | Admitting: Gynecologic Oncology

## 2020-12-19 DIAGNOSIS — R7303 Prediabetes: Secondary | ICD-10-CM | POA: Diagnosis not present

## 2020-12-19 DIAGNOSIS — Z6841 Body Mass Index (BMI) 40.0 and over, adult: Secondary | ICD-10-CM | POA: Diagnosis not present

## 2020-12-19 DIAGNOSIS — N959 Unspecified menopausal and perimenopausal disorder: Secondary | ICD-10-CM | POA: Insufficient documentation

## 2020-12-19 DIAGNOSIS — Z79899 Other long term (current) drug therapy: Secondary | ICD-10-CM | POA: Insufficient documentation

## 2020-12-19 DIAGNOSIS — Z888 Allergy status to other drugs, medicaments and biological substances status: Secondary | ICD-10-CM | POA: Insufficient documentation

## 2020-12-19 DIAGNOSIS — Z881 Allergy status to other antibiotic agents status: Secondary | ICD-10-CM | POA: Diagnosis not present

## 2020-12-19 DIAGNOSIS — K219 Gastro-esophageal reflux disease without esophagitis: Secondary | ICD-10-CM | POA: Diagnosis not present

## 2020-12-19 DIAGNOSIS — M1990 Unspecified osteoarthritis, unspecified site: Secondary | ICD-10-CM | POA: Diagnosis not present

## 2020-12-19 DIAGNOSIS — E785 Hyperlipidemia, unspecified: Secondary | ICD-10-CM | POA: Insufficient documentation

## 2020-12-19 DIAGNOSIS — E669 Obesity, unspecified: Secondary | ICD-10-CM | POA: Diagnosis not present

## 2020-12-19 DIAGNOSIS — I4891 Unspecified atrial fibrillation: Secondary | ICD-10-CM | POA: Diagnosis not present

## 2020-12-19 DIAGNOSIS — I10 Essential (primary) hypertension: Secondary | ICD-10-CM | POA: Insufficient documentation

## 2020-12-19 DIAGNOSIS — C541 Malignant neoplasm of endometrium: Secondary | ICD-10-CM | POA: Diagnosis not present

## 2020-12-19 HISTORY — PX: DILATION AND CURETTAGE OF UTERUS: SHX78

## 2020-12-19 HISTORY — PX: INTRAUTERINE DEVICE (IUD) INSERTION: SHX5877

## 2020-12-19 HISTORY — PX: LAPAROSCOPY: SHX197

## 2020-12-19 LAB — TYPE AND SCREEN
ABO/RH(D): O NEG
Antibody Screen: NEGATIVE

## 2020-12-19 LAB — ABO/RH: ABO/RH(D): O NEG

## 2020-12-19 SURGERY — LAPAROSCOPY, DIAGNOSTIC
Anesthesia: General

## 2020-12-19 MED ORDER — SUCCINYLCHOLINE CHLORIDE 200 MG/10ML IV SOSY
PREFILLED_SYRINGE | INTRAVENOUS | Status: AC
  Filled 2020-12-19: qty 10

## 2020-12-19 MED ORDER — ROCURONIUM BROMIDE 10 MG/ML (PF) SYRINGE
PREFILLED_SYRINGE | INTRAVENOUS | Status: AC
  Filled 2020-12-19: qty 10

## 2020-12-19 MED ORDER — FENTANYL CITRATE (PF) 100 MCG/2ML IJ SOLN
INTRAMUSCULAR | Status: DC | PRN
  Administered 2020-12-19 (×3): 50 ug via INTRAVENOUS

## 2020-12-19 MED ORDER — LACTATED RINGERS IV SOLN: INTRAVENOUS | Status: DC

## 2020-12-19 MED ORDER — HYDROMORPHONE HCL 1 MG/ML IJ SOLN
0.2500 mg | INTRAMUSCULAR | Status: DC | PRN
  Administered 2020-12-19: 0.25 mg via INTRAVENOUS

## 2020-12-19 MED ORDER — HEPARIN SODIUM (PORCINE) 5000 UNIT/ML IJ SOLN
5000.0000 [IU] | INTRAMUSCULAR | Status: AC
  Administered 2020-12-19: 5000 [IU] via SUBCUTANEOUS
  Filled 2020-12-19: qty 1

## 2020-12-19 MED ORDER — DEXAMETHASONE SODIUM PHOSPHATE 10 MG/ML IJ SOLN
INTRAMUSCULAR | Status: AC
  Filled 2020-12-19: qty 1

## 2020-12-19 MED ORDER — ORAL CARE MOUTH RINSE
15.0000 mL | Freq: Once | OROMUCOSAL | Status: AC
  Administered 2020-12-19: 15 mL via OROMUCOSAL

## 2020-12-19 MED ORDER — PHENYLEPHRINE 40 MCG/ML (10ML) SYRINGE FOR IV PUSH (FOR BLOOD PRESSURE SUPPORT)
PREFILLED_SYRINGE | INTRAVENOUS | Status: AC
  Filled 2020-12-19: qty 10

## 2020-12-19 MED ORDER — MIDAZOLAM HCL 2 MG/2ML IJ SOLN
INTRAMUSCULAR | Status: AC
  Filled 2020-12-19: qty 2

## 2020-12-19 MED ORDER — DEXMEDETOMIDINE (PRECEDEX) IN NS 20 MCG/5ML (4 MCG/ML) IV SYRINGE
PREFILLED_SYRINGE | INTRAVENOUS | Status: DC | PRN
  Administered 2020-12-19: 12 ug via INTRAVENOUS
  Administered 2020-12-19: 8 ug via INTRAVENOUS
  Administered 2020-12-19: 20 ug via INTRAVENOUS

## 2020-12-19 MED ORDER — CHLORHEXIDINE GLUCONATE 0.12 % MT SOLN: 15.0000 mL | Freq: Once | OROMUCOSAL | Status: AC

## 2020-12-19 MED ORDER — KETAMINE HCL 10 MG/ML IJ SOLN
INTRAMUSCULAR | Status: AC
  Filled 2020-12-19: qty 1

## 2020-12-19 MED ORDER — ONDANSETRON HCL 4 MG/2ML IJ SOLN
INTRAMUSCULAR | Status: AC
  Filled 2020-12-19: qty 2

## 2020-12-19 MED ORDER — LIDOCAINE 2% (20 MG/ML) 5 ML SYRINGE
INTRAMUSCULAR | Status: DC | PRN
  Administered 2020-12-19: 60 mg via INTRAVENOUS

## 2020-12-19 MED ORDER — DEXAMETHASONE SODIUM PHOSPHATE 10 MG/ML IJ SOLN
INTRAMUSCULAR | Status: DC | PRN
  Administered 2020-12-19: 10 mg via INTRAVENOUS

## 2020-12-19 MED ORDER — ACETAMINOPHEN 500 MG PO TABS
1000.0000 mg | ORAL_TABLET | Freq: Once | ORAL | Status: AC
  Administered 2020-12-19: 1000 mg via ORAL
  Filled 2020-12-19: qty 2

## 2020-12-19 MED ORDER — MIDAZOLAM HCL 5 MG/5ML IJ SOLN
INTRAMUSCULAR | Status: DC | PRN
  Administered 2020-12-19: 2 mg via INTRAVENOUS

## 2020-12-19 MED ORDER — LACTATED RINGERS IV SOLN: INTRAVENOUS | Status: DC | PRN

## 2020-12-19 MED ORDER — BUPIVACAINE HCL 0.25 % IJ SOLN
INTRAMUSCULAR | Status: DC | PRN
  Administered 2020-12-19: 15 mL

## 2020-12-19 MED ORDER — HYDROMORPHONE HCL 1 MG/ML IJ SOLN
INTRAMUSCULAR | Status: AC
  Administered 2020-12-19: 0.25 mg via INTRAVENOUS
  Filled 2020-12-19: qty 1

## 2020-12-19 MED ORDER — SODIUM CHLORIDE 0.9% FLUSH
3.0000 mL | Freq: Two times a day (BID) | INTRAVENOUS | Status: DC
Start: 2020-12-19 — End: 2020-12-19

## 2020-12-19 MED ORDER — LIDOCAINE 2% (20 MG/ML) 5 ML SYRINGE
INTRAMUSCULAR | Status: AC
  Filled 2020-12-19: qty 5

## 2020-12-19 MED ORDER — LIDOCAINE 20MG/ML (2%) 15 ML SYRINGE OPTIME
INTRAMUSCULAR | Status: DC | PRN
  Administered 2020-12-19: 1.5 mg/kg/h via INTRAVENOUS

## 2020-12-19 MED ORDER — DEXAMETHASONE SODIUM PHOSPHATE 4 MG/ML IJ SOLN: 4.0000 mg | INTRAMUSCULAR | Status: DC

## 2020-12-19 MED ORDER — ONDANSETRON HCL 4 MG/2ML IJ SOLN
INTRAMUSCULAR | Status: DC | PRN
  Administered 2020-12-19: 4 mg via INTRAVENOUS

## 2020-12-19 MED ORDER — SUGAMMADEX SODIUM 500 MG/5ML IV SOLN
INTRAVENOUS | Status: DC | PRN
  Administered 2020-12-19: 300 mg via INTRAVENOUS
  Administered 2020-12-19: 200 mg via INTRAVENOUS

## 2020-12-19 MED ORDER — ACETAMINOPHEN 500 MG PO TABS: 1000.0000 mg | ORAL_TABLET | ORAL | Status: AC

## 2020-12-19 MED ORDER — ROCURONIUM BROMIDE 10 MG/ML (PF) SYRINGE
PREFILLED_SYRINGE | INTRAVENOUS | Status: DC | PRN
  Administered 2020-12-19: 50 mg via INTRAVENOUS

## 2020-12-19 MED ORDER — LIDOCAINE HCL 2 % IJ SOLN
INTRAMUSCULAR | Status: AC
  Filled 2020-12-19: qty 20

## 2020-12-19 MED ORDER — PHENYLEPHRINE HCL (PRESSORS) 10 MG/ML IV SOLN
INTRAVENOUS | Status: AC
  Filled 2020-12-19: qty 2

## 2020-12-19 MED ORDER — STERILE WATER FOR INJECTION IJ SOLN
INTRAMUSCULAR | Status: AC
  Filled 2020-12-19: qty 10

## 2020-12-19 MED ORDER — SUCCINYLCHOLINE CHLORIDE 200 MG/10ML IV SOSY
PREFILLED_SYRINGE | INTRAVENOUS | Status: DC | PRN
  Administered 2020-12-19: 140 mg via INTRAVENOUS

## 2020-12-19 MED ORDER — PROPOFOL 10 MG/ML IV BOLUS
INTRAVENOUS | Status: DC | PRN
  Administered 2020-12-19: 150 mg via INTRAVENOUS

## 2020-12-19 MED ORDER — LEVONORGESTREL 20 MCG/DAY IU IUD
1.0000 | INTRAUTERINE_SYSTEM | INTRAUTERINE | Status: AC
  Administered 2020-12-19: 1 via INTRAUTERINE
  Filled 2020-12-19: qty 1

## 2020-12-19 MED ORDER — FENTANYL CITRATE (PF) 250 MCG/5ML IJ SOLN
INTRAMUSCULAR | Status: AC
  Filled 2020-12-19: qty 5

## 2020-12-19 SURGICAL SUPPLY — 48 items
BACTOSHIELD CHG 4% 4OZ (MISCELLANEOUS) ×1
COVER BACK TABLE 60X90IN (DRAPES) ×2 IMPLANT
COVER TIP SHEARS 8 DVNC (MISCELLANEOUS) ×1 IMPLANT
COVER TIP SHEARS 8MM DA VINCI (MISCELLANEOUS) ×2
DERMABOND ADVANCED (GAUZE/BANDAGES/DRESSINGS) ×1
DERMABOND ADVANCED .7 DNX12 (GAUZE/BANDAGES/DRESSINGS) ×1 IMPLANT
DRAPE ARM DVNC X/XI (DISPOSABLE) ×4 IMPLANT
DRAPE COLUMN DVNC XI (DISPOSABLE) ×1 IMPLANT
DRAPE DA VINCI XI ARM (DISPOSABLE) ×8
DRAPE DA VINCI XI COLUMN (DISPOSABLE) ×2
DRAPE SHEET LG 3/4 BI-LAMINATE (DRAPES) ×2 IMPLANT
DRAPE SURG IRRIG POUCH 19X23 (DRAPES) ×2 IMPLANT
DRAPE UNDERBUTTOCKS STRL (DISPOSABLE) ×2 IMPLANT
DRSG TELFA 3X8 NADH (GAUZE/BANDAGES/DRESSINGS) ×2 IMPLANT
ELECT REM PT RETURN 15FT ADLT (MISCELLANEOUS) ×2 IMPLANT
GLOVE SURG ENC MOIS LTX SZ6 (GLOVE) ×8 IMPLANT
GOWN STRL REUS W/ TWL LRG LVL3 (GOWN DISPOSABLE) ×4 IMPLANT
GOWN STRL REUS W/TWL LRG LVL3 (GOWN DISPOSABLE) ×8
IRRIG SUCT STRYKERFLOW 2 WTIP (MISCELLANEOUS) ×2
IRRIGATION SUCT STRKRFLW 2 WTP (MISCELLANEOUS) ×1 IMPLANT
KIT BASIN OR (CUSTOM PROCEDURE TRAY) ×2 IMPLANT
KIT PROCEDURE DA VINCI SI (MISCELLANEOUS) ×2
KIT PROCEDURE DVNC SI (MISCELLANEOUS) ×1 IMPLANT
KIT TURNOVER KIT A (KITS) ×2 IMPLANT
MANIPULATOR UTERINE 4.5 ZUMI (MISCELLANEOUS) ×2 IMPLANT
NEEDLE HYPO 21X1.5 SAFETY (NEEDLE) ×2 IMPLANT
OBTURATOR OPTICAL STANDARD 8MM (TROCAR) ×2
OBTURATOR OPTICAL STND 8 DVNC (TROCAR) ×1
OBTURATOR OPTICALSTD 8 DVNC (TROCAR) ×1 IMPLANT
PACK ROBOT GYN CUSTOM WL (TRAY / TRAY PROCEDURE) ×2 IMPLANT
PAD OB MATERNITY 4.3X12.25 (PERSONAL CARE ITEMS) ×2 IMPLANT
PAD POSITIONING PINK XL (MISCELLANEOUS) ×2 IMPLANT
PORT ACCESS TROCAR AIRSEAL 12 (TROCAR) ×1 IMPLANT
PORT ACCESS TROCAR AIRSEAL 5M (TROCAR) ×1
SCRUB CHG 4% DYNA-HEX 4OZ (MISCELLANEOUS) ×1 IMPLANT
SEAL CANN UNIV 5-8 DVNC XI (MISCELLANEOUS) ×4 IMPLANT
SEAL XI 5MM-8MM UNIVERSAL (MISCELLANEOUS) ×8
SET TRI-LUMEN FLTR TB AIRSEAL (TUBING) ×2 IMPLANT
SURGILUBE 2OZ TUBE FLIPTOP (MISCELLANEOUS) ×2 IMPLANT
SUT VIC AB 0 CT1 27 (SUTURE) ×2
SUT VIC AB 0 CT1 27XBRD ANTBC (SUTURE) ×1 IMPLANT
SUT VIC AB 4-0 PS2 18 (SUTURE) ×4 IMPLANT
SYR BULB IRRIG 60ML STRL (SYRINGE) ×2 IMPLANT
TOWEL OR 17X26 10 PK STRL BLUE (TOWEL DISPOSABLE) ×2 IMPLANT
TOWEL OR NON WOVEN STRL DISP B (DISPOSABLE) ×2 IMPLANT
TRAY FOLEY MTR SLVR 16FR STAT (SET/KITS/TRAYS/PACK) ×2 IMPLANT
UNDERPAD 30X36 HEAVY ABSORB (UNDERPADS AND DIAPERS) ×4 IMPLANT
WATER STERILE IRR 1000ML POUR (IV SOLUTION) ×2 IMPLANT

## 2020-12-19 NOTE — Anesthesia Procedure Notes (Signed)
Procedure Name: Intubation Date/Time: 12/19/2020 7:53 AM Performed by: Pilar Grammes, CRNA Pre-anesthesia Checklist: Patient identified, Emergency Drugs available, Suction available, Patient being monitored and Timeout performed Patient Re-evaluated:Patient Re-evaluated prior to induction Oxygen Delivery Method: Circle system utilized Preoxygenation: Pre-oxygenation with 100% oxygen Induction Type: IV induction Ventilation: Mask ventilation without difficulty Laryngoscope Size: 3 and Mac Grade View: Grade I Tube type: Oral Tube size: 7.0 mm Number of attempts: 1 Airway Equipment and Method: Stylet and Video-laryngoscopy Placement Confirmation: positive ETCO2, ETT inserted through vocal cords under direct vision, CO2 detector and breath sounds checked- equal and bilateral Secured at: 22 cm Tube secured with: Tape Dental Injury: Teeth and Oropharynx as per pre-operative assessment

## 2020-12-19 NOTE — Anesthesia Procedure Notes (Signed)
Arterial Line Insertion Start/End6/16/2022 8:57 AM, 12/19/2020 8:57 AM Performed by: anesthesiologist  Patient location: Pre-op. Preanesthetic checklist: patient identified, IV checked, site marked, risks and benefits discussed, surgical consent, monitors and equipment checked, pre-op evaluation, timeout performed and anesthesia consent Right, radial was placed Catheter size: 20 G Hand hygiene performed  and maximum sterile barriers used   Attempts: 1 Procedure performed without using ultrasound guided technique. Following insertion, dressing applied. Post procedure assessment: normal and unchanged

## 2020-12-19 NOTE — Discharge Instructions (Addendum)
Patient education: Dilation and curettage (D&C) (The Basics)  What is dilation and curettage? Dilation and curettage is a procedure to remove tissue from the inside of the uterus. It is also called a "D and C" or "D&C." During a D&C, a doctor first opens (dilates) the cervix. (The cervix is the bottom part or the "neck" of the uterus.) Then they put a surgical tool called a "curette" through the vagina and cervix, and up into the uterus. They use thecurette to scrape and remove tissue from the uterus. A D&C is done in an operating room in a hospital or clinic. Why might my doctor do a D&C? Your doctor might do a D&C to figure out the cause of a symptom or problem. During a D&C, a doctor gets a sample of tissue from your uterus. The sample canthen be checked for abnormal cells, cancer, or other problems. Reasons you might have a D&C include: ?To stop severe vaginal bleeding - For example, you might have a D&C if yourperiod is too heavy.  ?To figure out the cause of abnormal bleeding - For example, if you have very heavy periods, or if you have vaginal bleeding after going through menopause, aD&C might help your doctor figure out what is causing the problem.  ?To get more information after an abnormal result from another test - For example, if you had a test to check for uterine cancer, your doctor might do a D&C to learn more.  Doctors can also do a D&C for other reasons. In pregnant or recently pregnantpeople, a doctor can do a D&C to: ?Remove any pregnancy tissue that is left in the uterus after a miscarriage - A miscarriage, or "pregnancy loss," is when a pregnancy ends on its own beforethe baby can live outside the womb.  ?Remove any pregnancy tissue that is left in the uterus after childbirth.  ?Remove an abnormal growth called a "molar pregnancy" that has formed in theuterus.  ?Do an abortion (end a pregnancy) during the first trimester.  What should I do before a D&C? Your doctor will  give you instructions about what to do before a D&C. They will probably tell you not to eat or drink anything starting the night before theprocedure. Your doctor might give you medicine to put inside your vagina near your cervix the day before your D&C. The medicine can soften your cervix or start to dilate it. Let your doctor or nurse know if you have any trouble getting ready foryour D&C, or if you have questions about what to expect. What happens during a D&C? You will have a thin tube that goes into a vein, called an "IV," put in your arm or hand. Your doctor or nurse will give you fluids and medicines through the IV. Some of these medicines will make you feel relaxed, sleepy, or numbduring the procedure. When a doctor does a D&C to find out why a person is having symptoms, they might do another test called a "hysteroscopy" at the same time. During a hysteroscopy, the doctor puts a small camera inside the uterus to look for problems that could be causing the symptoms. If they find a growth in theuterus, they might do a procedure during the D&C to remove it. When a doctor does a D&C to treat a problem or condition, they will remove anything concerning that is inside the uterus. They might also scrape away someof the lining of the uterus. What happens after a D&C? After a D&C, your doctor or  nurse will watch you for a while to make sure you don't have any problems. This might take up to a few hours. When you are ableto go home from the hospital, someone else should drive you. Your doctor or nurse will tell you when you can start your usual activities again. They will also tell you when it is safe to have sex and put things, suchas tampons, in your vagina. Typically, you will get your period within 4 to 6 weeks after a D&C. What are the side effects of a D&C? The most common side effects are mild cramping and slight bleeding from the vagina (called spotting). These can last for a few days. If you have  pain, youcan take a pain-relieving medicine. Other side effects are not common but can occur after a D&C. These include: ?A tear in the uterus ?Injury to the cervix ?Infection ?Areas of scar tissue that form in the uterus  Should I call my doctor or nurse? Call your doctor or nurse if you have any of the following problems after your D&C: ?Fever higher than 100.28F (38C) ?Cramps that last more than 2 days ?Pain that gets worse ?Heavy vaginal bleeding, or vaginal bleeding that lasts more than 2 weeks ?Vaginal discharge that is green or smells bad  Call our office [phone] for questions or concerns

## 2020-12-19 NOTE — Op Note (Signed)
PATIENT: Patty Estrada  [place] DATE: 12/19/20  Preop Diagnosis: clinical stage I grade 1 endometrioid endometrial cancer  Postoperative Diagnosis: same as above  Surgery: Diagnostic laparoscopy, D&C (dilation and curettage), Mirena intra-uterine device insertion  Surgeons:  Jeral Pinch MD  Assistants: Lahoma Crocker MD  Anesthesia: General   Estimated blood loss: 60m  IVF:  see I&O flowsheet   Urine output: 2570ml   Complications: None apparent  Pathology: endometrial curetteings  Operative findings: 10 cm mobile uterus on EUA. Cervix normal appearing. On intra-abdominal entry, normal upper abdominal survey. Significant intra-abdominal adiposity noted. Normal omentum, small and large bowel. Uterus 10-12 cm and bulbous. Ovaries not visualized. Once insufflated (max pressure 12 mm Hg) and in some Trendelenburg (20 degrees), the patient was not able to be adequately ventilated (saturations high 80s-92, low tidal volumes, high pressures). Decision was made with anesthesia to abandon plan for robotic hysterectomy. Copious blood and tissue noted on curettage.  Mirena IUD information: LOT [id number] exp 02/2023  Procedure: The patient was seen in the Holding Room. The risks, benefits, complications, treatment options, and expected outcomes were discussed with the patient.  The patient concurred with the proposed plan, giving informed consent.  The site of surgery properly noted/marked. [facility] verified as a Robotic-assisted hysterectomy with bilateral salpingo oophorectomy with SLN biopsy, possible laparotomy, possible D&C with Mirena IUD insertion.   After induction of anesthesia and placement of an arterial line, the patient was draped and prepped in the usual sterile manner. Patient was placed in supine position after anesthesia and draped and prepped in the usual sterile manner as follows: Her arms were tucked to  her side with all appropriate precautions.  The shoulders were stabilized with padded shoulder blocks applied to the acromium processes.  The patient was placed in the semi-lithotomy position in [place]  A tilt test was performed which the patient tolerated.  The perineum and vagina were prepped with CholoraPrep. The patient was draped after the CholoraPrep had been allowed to dry for 3 minutes.  A Time Out was held and the above information confirmed. The urethra was prepped with Betadine. Foley catheter was placed.  A sterile speculum was placed in the vagina.  The cervix was grasped with a single-tooth tenaculum. 241mtotal of ICG was injected into the cervical stroma at 2 and 9 o'clock with 1cc injected at a 1cm and 98m65mepth (concentration 0.5mg34m) in all locations. The cervix was already noted to be dilated.  The ZUMI uterine manipulator with a medium colpotomizer ring was placed without difficulty.  A pneum occluder balloon was placed over the manipulator.  OG tube placement was confirmed and to suction.   Next, a 10 mm skin incision was made 1 cm below the subcostal margin in the midclavicular line.  The 5 mm Optiview port and scope was used for direct entry.  Opening pressure was under 10 mm CO2.  The abdomen was insufflated and the findings were noted as above.  The patient was placed in Trendelenburg. Unfortunately, even with a max intra-abdominal pressure of 10-12 mm Hg and no more than 20 degrees of Trendelenburg, the patient could not be adequately ventilated. The decision was made to abdandon planned robotic procedure and instead proceed with D&C and Mirena placement.   The foley catheter was removed as was the uterine manipulator. The speculum was placed in the vagina. The single tooth tenaculum was placed on the anterior lip of the cervix.  The uterine sound was placed in the cervix and advanced to the fundus at just under 12cm. The cervix was already sufficiently dilated. A sharp  curette was advanced to the fundus and a comprehensive curette of the endometrial cavity took place until a gritty feel was appreciated. The specimen was collected on a telfa and sent for permanent pathology.  The Mirena IUD was inserted to the fundus, deployed, and the inserter was removed. The strings were cut at 4 cm. The tenaculum was removed and hemostasis was observed.   The vagina was irrigated copiously.  All instrument, suture, laparotomy, Ray-Tec, and needle counts were correct x2. The patient tolerated the procedure well and was taken recovery room in stable condition.   Jeral Pinch, MD

## 2020-12-19 NOTE — Addendum Note (Signed)
Addendum  created 12/19/20 1232 by Pilar Grammes, CRNA   Intraprocedure Meds edited

## 2020-12-19 NOTE — Anesthesia Postprocedure Evaluation (Signed)
Anesthesia Post Note  Patient: Patty Estrada  Procedure(s) Performed: Diagnostic Laparoscopy POSSIBLE DILATATION AND CURETTAGE POSSIBLE INTRAUTERINE DEVICE (IUD) INSERTION     Patient location during evaluation: PACU Anesthesia Type: General Level of consciousness: awake and alert Pain management: pain level controlled Vital Signs Assessment: post-procedure vital signs reviewed and stable Respiratory status: spontaneous breathing, nonlabored ventilation and respiratory function stable Cardiovascular status: blood pressure returned to baseline and stable Postop Assessment: no apparent nausea or vomiting Anesthetic complications: no   No notable events documented.  Last Vitals:  Vitals:   12/19/20 1030 12/19/20 1048  BP: (!) 142/62 (!) 142/62  Pulse: 77 78  Resp: 15 16  Temp:    SpO2: 96% 99%    Last Pain:  Vitals:   12/19/20 1015  TempSrc:   PainSc: 3                  Brandie Lopes,W. EDMOND

## 2020-12-19 NOTE — Transfer of Care (Signed)
Immediate Anesthesia Transfer of Care Note  Patient: Patty Estrada  Procedure(s) Performed: Diagnostic Laparoscopy POSSIBLE DILATATION AND CURETTAGE POSSIBLE INTRAUTERINE DEVICE (IUD) INSERTION  Patient Location: PACU  Anesthesia Type:General  Level of Consciousness: awake, alert , drowsy and patient cooperative  Airway & Oxygen Therapy: Patient Spontanous Breathing and Patient connected to face mask oxygen  Post-op Assessment: Report given to RN and Post -op Vital signs reviewed and stable  Post vital signs: stable  Last Vitals:  Vitals Value Taken Time  BP 91/75 12/19/20 0933  Temp    Pulse 75 12/19/20 0938  Resp 20 12/19/20 0940  SpO2 96 % 12/19/20 0938  Vitals shown include unvalidated device data.  Last Pain:  Vitals:   12/19/20 0538  TempSrc: Oral         Complications: No notable events documented.

## 2020-12-19 NOTE — Interval H&P Note (Signed)
History and Physical Interval Note:  12/19/2020 7:03 AM  Patty Estrada  has presented today for surgery, with the diagnosis of ENDOMETRIAL CANCER.  The various methods of treatment have been discussed with the patient and family. After consideration of risks, benefits and other options for treatment, the patient has consented to  Procedure(s): XI ROBOTIC ASSISTED TOTAL HYSTERECTOMY WITH BILATERAL SALPINGO OOPHORECTOMY POSSIBLE LAPAROTOMY (N/A) POSSIBLE SENTINEL NODE BIOPSY (N/A) POSSIBLE PELVIC LYMPH NODE DISSECTION (N/A) POSSIBLE DILATATION AND CURETTAGE (N/A) POSSIBLE INTRAUTERINE DEVICE (IUD) INSERTION (N/A) as a surgical intervention.  The patient's history has been reviewed, patient examined, no change in status, stable for surgery.  I have reviewed the patient's chart and labs.  Questions were answered to the patient's satisfaction.     Lafonda Mosses

## 2020-12-20 ENCOUNTER — Encounter (HOSPITAL_COMMUNITY): Payer: Self-pay | Admitting: Gynecologic Oncology

## 2020-12-20 ENCOUNTER — Telehealth: Payer: Self-pay

## 2020-12-20 NOTE — Telephone Encounter (Signed)
Following up with Ms. Macgregor after her surgery yesterday. Left message to return call.

## 2020-12-20 NOTE — Telephone Encounter (Signed)
Spoke with Patty Estrada, she states she is eating, drinking and urinating well. She has not had a BM yet but is passing gas. She denies fever or chills. Abdominal incision has small amount of serosanguinous fluid draining. Patient states it is getting better. Instructed her to call if this increases or the drainage changes in color or has an odor. She has not needed to take any pain medicine.   Patient states she always has some phlegm but it has increased since surgery. She states she is using her incentive spirometer and denies any difficulty breathing. Per MD ok to use guaifenesin over the counter.   Instructed to call office with any fever, chills, purulent drainage, uncontrolled pain or any other questions or concerns. Patient verbalizes understanding.   Pt aware of post op appointments as well as the office number [phone] and after hours number [phone] to call if she has any questions or concerns

## 2020-12-23 NOTE — Progress Notes (Signed)
I called the patient to review pathology from surgery last week. No answer. Left voicemail with callback requested.  Valarie Cones MD

## 2020-12-24 ENCOUNTER — Telehealth: Payer: Self-pay | Admitting: *Deleted

## 2020-12-24 NOTE — Telephone Encounter (Signed)
Patient returning Dr Charisse March call, message routed to Dr Berline Lopes

## 2020-12-25 ENCOUNTER — Encounter: Payer: Self-pay | Admitting: Gynecologic Oncology

## 2020-12-25 ENCOUNTER — Inpatient Hospital Stay: Payer: Medicare Other | Attending: Gynecologic Oncology | Admitting: Gynecologic Oncology

## 2020-12-25 DIAGNOSIS — C541 Malignant neoplasm of endometrium: Secondary | ICD-10-CM

## 2020-12-25 NOTE — Progress Notes (Signed)
Gynecologic Oncology Telehealth Consult Note: Gyn-Onc  I connected with Patty Estrada on 12/25/20 at  5:00 PM EDT by telephone and verified that I am speaking with the correct person using two identifiers.  I discussed the limitations, risks, security and privacy concerns of performing an evaluation and management service by telemedicine and the availability of in-person appointments. I also discussed with the patient that there may be a patient responsible charge related to this service. The patient expressed understanding and agreed to proceed.  Other persons participating in the visit and their role in the encounter: None.  Patient's location: Home Provider's location: [facility]  Reason for Visit: Follow-up after recent surgery, review of pathology  Interval History: Patient is doing well since surgery.  Her bleeding is back to baseline/minimal.  She is eating and drinking without difficulty.  Reports regular bowel and bladder function.  Last week she underwent attempted robotic endometrial cancer staging although this was aborted given lack of tolerating both insufflation and Trendelenburg.  Decision made to proceed with D&C and Mirena IUD insertion.  Past Medical/Surgical History: Past Medical History:  Diagnosis Date   Adenomatous colon polyp 2006       Arthritis    Atrial fibrillation ([place]) 2013   Depression    Dysrhythmia 2013   a-fib   Endometrial cancer ([place])    Fatty liver    ?cirrhosis on u/s in 2008   GERD (gastroesophageal reflux disease)    HTN (hypertension)    Hyperglycemia    Hyperlipidemia    NASH (nonalcoholic steatohepatitis)    Obesity    Peripheral vascular disease ([place]) 2003   Wt was 408 lbs   Pre-diabetes    Urinary incontinence    Vitamin D deficiency     Past Surgical History:  Procedure Laterality Date   COLONOSCOPY  04/09/2005   adenomatous polyps, two diverticula   COLONOSCOPY  12/2010   RMR: left-sided diverticula,  adenomatous polyps, next TCS due 12/2015   DILATION AND CURETTAGE OF UTERUS  2015   DILATION AND CURETTAGE OF UTERUS N/A 12/19/2020   Procedure: POSSIBLE DILATATION AND CURETTAGE;  Surgeon: Lafonda Mosses, MD;  Location: [facility];  Service: Gynecology;  Laterality: N/A;   ESOPHAGOGASTRODUODENOSCOPY  2006   hyperplastic polyps, hh   ESOPHAGOGASTRODUODENOSCOPY  12/2010   RMR:hh, multiple benign gastric polyps,    HYSTEROSCOPY  02/27/2014   Procedure: HYSTEROSCOPY;  Surgeon: Jonnie Kind, MD;  Location: [facility];  Service: Gynecology;;   INTRAUTERINE DEVICE (IUD) INSERTION N/A 12/19/2020   Procedure: POSSIBLE INTRAUTERINE DEVICE (IUD) INSERTION;  Surgeon: Lafonda Mosses, MD;  Location: [facility];  Service: Gynecology;  Laterality: N/A;   LAPAROSCOPY N/A 12/19/2020   Procedure: Diagnostic Laparoscopy;  Surgeon: Lafonda Mosses, MD;  Location: [facility];  Service: Gynecology;  Laterality: N/A;   POLYPECTOMY N/A 02/27/2014   Procedure: REMOVAL OF ENDOCERVICAL POLYP AND ENDOMETRIAL POLYPS;  Surgeon: Jonnie Kind, MD;  Location: [facility];  Service: Gynecology;  Laterality: N/A;   TUBAL LIGATION      Family History  Problem Relation Age of Onset   Breast cancer Mother 59   Heart attack Father 82   Heart disease Brother    Heart attack Brother    Colon cancer Neg Hx    Liver disease Neg Hx    Uterine cancer Neg Hx    Ovarian cancer Neg Hx     Social History   Socioeconomic History   Marital status: Married  Spouse name: Not on file   Number of children: 2   Years of education: Not on file   Highest education level: Not on file  Occupational History   Occupation: retired  Tobacco Use   Smoking status: Former    Packs/day: 2.00    Years: 13.00    Pack years: 26.00    Types: Cigarettes    Quit date: 07/06/1984    Years since quitting: 36.4   Smokeless tobacco: Never   Tobacco comments:    Quit since 1986  Vaping Use   Vaping Use: Never used  Substance and Sexual Activity    Alcohol use: Yes    Alcohol/week: 0.0 standard drinks    Comment: maybe one or two drinks a year   Drug use: No   Sexual activity: Not Currently    Birth control/protection: Surgical, Post-menopausal  Other Topics Concern   Not on file  Social History Narrative   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   Social Determinants of Health   Financial Resource Strain: Low Risk    Difficulty of Paying Living Expenses: Not very hard  Food Insecurity: No Food Insecurity   Worried About Charity fundraiser in the Last Year: Never true   Ran Out of Food in the Last Year: Never true  Transportation Needs: No Transportation Needs   Lack of Transportation (Medical): No   Lack of Transportation (Non-Medical): No  Physical Activity: Inactive   Days of Exercise per Week: 0 days   Minutes of Exercise per Session: 0 min  Stress: No Stress Concern Present   Feeling of Stress : Only a little  Social Connections: Moderately Isolated   Frequency of Communication with Friends and Family: More than three times a week   Frequency of Social Gatherings with Friends and Family: Patient refused   Attends Religious Services: Never   Marine scientist or Organizations: No   Attends Music therapist: Never   Marital Status: Married    Current Medications:  Current Outpatient Medications:    acetaminophen (TYLENOL) 650 MG CR tablet, Take 1,300 mg by mouth every 8 (eight) hours as needed for pain., Disp: , Rfl:    atenolol (TENORMIN) 50 MG tablet, Take 50 mg by mouth daily., Disp: , Rfl:    atorvastatin (LIPITOR) 40 MG tablet, Take 40 mg by mouth daily., Disp: , Rfl:    benazepril (LOTENSIN) 40 MG tablet, Take 40 mg by mouth daily., Disp: , Rfl:    diclofenac sodium (VOLTAREN) 1 % GEL, Apply 1 application topically 2 (two) times daily as needed (knee pain)., Disp: , Rfl: 5   methocarbamol (ROBAXIN) 500 MG tablet, Take 500 mg by mouth every 6 (six) hours as needed for muscle spasms., Disp: ,  Rfl:    mirtazapine (REMERON) 15 MG tablet, Take 7.5 mg by mouth at bedtime., Disp: , Rfl:    OVER THE COUNTER MEDICATION, Take 2 capsules by mouth daily. Omega 3/ALA/Chromium, Disp: , Rfl:    polyethylene glycol powder (GLYCOLAX/MIRALAX) powder, MIX 1 CAPFUL IN LIQUID AT BEDTIME AS NEEDED FOR CONSTIPATION. (Patient taking differently: Take 1 Container by mouth at bedtime as needed for moderate constipation.), Disp: 527 g, Rfl: 3   Probiotic Product (PROBIOTIC DAILY) CAPS, Take 1 capsule by mouth daily., Disp: , Rfl:    senna-docusate (SENOKOT-S) 8.6-50 MG tablet, Take 2 tablets by mouth at bedtime. For AFTER surgery, do not take if having diarrhea, Disp: 30 tablet, Rfl: 0   sodium-potassium bicarbonate (ALKA-SELTZER  GOLD) TBEF dissolvable tablet, Take 2 tablets by mouth 2 (two) times daily as needed (heartburn)., Disp: , Rfl:    traMADol (ULTRAM) 50 MG tablet, Take 1 tablet (50 mg total) by mouth every 6 (six) hours as needed for severe pain. For AFTER surgery only, do not take and drive, Disp: 10 tablet, Rfl: 0  Review of Symptoms: Pertinent positives as per HPI.  Otherwise review of systems negative.Marland Kitchen  Physical Exam: There were no vitals taken for this visit. Deferred given limitations of phone visit  Laboratory & Radiologic Studies: A. ENDOMETRIAL, CURRETTINGS, BIOPSY:  - Endometrioid adenocarcinoma, FIGO grade 1, associated with complex  atypical hyperplasia.  - See comment.   Assessment & Plan: Patty Estrada is a 70 y.o. woman with clinical stage 1A grade 1 endometrioid endometrial adenocarcinoma who presents for telephone follow-up discussion regarding pathology and treatment plan.  I reviewed pathology with the patient, which confirms grade 1 endometrial adenocarcinoma in the background of complex atypical hyperplasia.  Discussed again, as I had in the recovery room with the patient, why robotic surgery cannot be performed.  Given difficulty ventilating in the setting of her  obesity, we had discussed dietary and lifestyle changes to work on weight loss.  The patient is motivated and interested in making these.  Likely her age and comorbidities would preclude bariatric surgery although if she is unable to make significant changes, this could be considered in the future.  The patient had previously looked into a weight loss program previously.  She found a [facility] health weight management center on [address] in [place] that is close to her house.  She prefers to go to this center as she hopes that something convenient and close by would help increase the chances of success for her.  Patient overall is doing well from a surgery standpoint.  She had a postoperative visit in early July which we will change to a 69-monthafter surgery visit.  I discussed the assessment and treatment plan with the patient. The patient was provided with an opportunity to ask questions and all were answered. The patient agreed with the plan and demonstrated an understanding of the instructions.   The patient was advised to call back or see an in-person evaluation if the symptoms worsen or if the condition fails to improve as anticipated.   22 minutes of total time was spent for this patient encounter, including preparation, over-the-phone counseling with the patient and coordination of care, and documentation of the encounter.   KJeral Pinch MD  Division of Gynecologic Oncology  Department of Obstetrics and Gynecology  [facility]

## 2020-12-26 ENCOUNTER — Telehealth: Payer: Self-pay | Admitting: *Deleted

## 2020-12-26 ENCOUNTER — Other Ambulatory Visit: Payer: Self-pay

## 2020-12-26 NOTE — Telephone Encounter (Signed)
Called Rhonda in pathology and added MMR/IHC per Dr Berline Lopes

## 2020-12-27 ENCOUNTER — Ambulatory Visit: Payer: Medicare Other | Admitting: Gynecologic Oncology

## 2020-12-27 LAB — SURGICAL PATHOLOGY

## 2021-01-10 ENCOUNTER — Encounter: Payer: Medicare Other | Admitting: Gynecologic Oncology

## 2021-01-31 DIAGNOSIS — C541 Malignant neoplasm of endometrium: Secondary | ICD-10-CM | POA: Diagnosis not present

## 2021-01-31 DIAGNOSIS — R7309 Other abnormal glucose: Secondary | ICD-10-CM | POA: Diagnosis not present

## 2021-01-31 DIAGNOSIS — I10 Essential (primary) hypertension: Secondary | ICD-10-CM | POA: Diagnosis not present

## 2021-01-31 DIAGNOSIS — E1122 Type 2 diabetes mellitus with diabetic chronic kidney disease: Secondary | ICD-10-CM | POA: Diagnosis not present

## 2021-02-12 DIAGNOSIS — H5211 Myopia, right eye: Secondary | ICD-10-CM | POA: Diagnosis not present

## 2021-03-13 NOTE — Progress Notes (Signed)
Gynecologic Oncology Return Clinic Visit  03/14/21  Reason for Visit: follow-up in the setting of endometrial cancer undergoing hormonal treatment  Treatment History: Oncology History  Endometrial adenocarcinoma ([place])  10/28/2020 Initial Biopsy   EMB: Grade 1 endometrioid adenoca   10/28/2020 Initial Diagnosis   Endometrial adenocarcinoma ([place])   12/19/2020 Surgery   D&C: endometrioid adenoca, grade 1, with associated CAH  IHC MMR intact     Interval History: Patient presents today for follow-up after D&C with Mirena IUD insertion in mid June.  She notes overall doing well.  Her bleeding has decreased significantly.  She now has a tinge of pink when she wipes or will see a small spot of pink on her incontinence pads.  She has 2 to 3 days a week now where she does not see any spotting.  She has what she describes as very occasional bloody discharge if she lifts or carry something heavy.  Otherwise denies heavier bleeding, passage of clots, cramping, or pelvic pain.  By her home scale, she has lost just shy of 25 pounds.  She has done this by journaling all of the oral intake that she has, cutting out sweets, and increasing her protein and water consumption.  She is also working on portion control.  She had called the weight center close to her house in June but the earliest appointment they had at that point was end of July.  She has decided to work on weight loss on her own until she feels she needs additional help.  Past Medical/Surgical History: Past Medical History:  Diagnosis Date   Adenomatous colon polyp 2006       Arthritis    Atrial fibrillation ([place]) 2013   Depression    Dysrhythmia 2013   a-fib   Endometrial cancer ([place])    Fatty liver    ?cirrhosis on u/s in 2008   GERD (gastroesophageal reflux disease)    HTN (hypertension)    Hyperglycemia    Hyperlipidemia    NASH (nonalcoholic steatohepatitis)    Obesity    Peripheral vascular disease ([place]) 2003   Wt was 408  lbs   Pre-diabetes    Urinary incontinence    Vitamin D deficiency     Past Surgical History:  Procedure Laterality Date   COLONOSCOPY  04/09/2005   adenomatous polyps, two diverticula   COLONOSCOPY  12/2010   RMR: left-sided diverticula, adenomatous polyps, next TCS due 12/2015   DILATION AND CURETTAGE OF UTERUS  2015   DILATION AND CURETTAGE OF UTERUS N/A 12/19/2020   Procedure: POSSIBLE DILATATION AND CURETTAGE;  Surgeon: Lafonda Mosses, MD;  Location: [facility];  Service: Gynecology;  Laterality: N/A;   ESOPHAGOGASTRODUODENOSCOPY  2006   hyperplastic polyps, hh   ESOPHAGOGASTRODUODENOSCOPY  12/2010   RMR:hh, multiple benign gastric polyps,    HYSTEROSCOPY  02/27/2014   Procedure: HYSTEROSCOPY;  Surgeon: Jonnie Kind, MD;  Location: [facility];  Service: Gynecology;;   INTRAUTERINE DEVICE (IUD) INSERTION N/A 12/19/2020   Procedure: POSSIBLE INTRAUTERINE DEVICE (IUD) INSERTION;  Surgeon: Lafonda Mosses, MD;  [facility];  Service: Gynecology;  Laterality: N/A;   LAPAROSCOPY N/A 12/19/2020   Procedure: Diagnostic Laparoscopy;  Surgeon: Lafonda Mosses, MD;  [facility];  Service: Gynecology;  Laterality: N/A;   POLYPECTOMY N/A 02/27/2014   Procedure: REMOVAL OF ENDOCERVICAL POLYP AND ENDOMETRIAL POLYPS;  Surgeon: Jonnie Kind, MD;  Location: [facility];  Service: Gynecology;  Laterality: N/A;   TUBAL LIGATION  Family History  Problem Relation Age of Onset   Breast cancer Mother 42   Heart attack Father 40   Heart disease Brother    Heart attack Brother    Colon cancer Neg Hx    Liver disease Neg Hx    Uterine cancer Neg Hx    Ovarian cancer Neg Hx     Social History   Socioeconomic History   Marital status: Married    Spouse name: Not on file   Number of children: 2   Years of education: Not on file   Highest education level: Not on file  Occupational History   Occupation: retired  Tobacco Use   Smoking status: Former    Packs/day: 2.00    Years:  13.00    Pack years: 26.00    Types: Cigarettes    Quit date: 07/06/1984    Years since quitting: 36.7   Smokeless tobacco: Never   Tobacco comments:    Quit since 1986  Vaping Use   Vaping Use: Never used  Substance and Sexual Activity   Alcohol use: Yes    Alcohol/week: 0.0 standard drinks    Comment: maybe one or two drinks a year   Drug use: No   Sexual activity: Not Currently    Birth control/protection: Surgical, Post-menopausal  Other Topics Concern   Not on file  Social History Narrative   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   Social Determinants of Health   Financial Resource Strain: Low Risk    Difficulty of Paying Living Expenses: Not very hard  Food Insecurity: No Food Insecurity   Worried About Charity fundraiser in the Last Year: Never true   Ran Out of Food in the Last Year: Never true  Transportation Needs: No Transportation Needs   Lack of Transportation (Medical): No   Lack of Transportation (Non-Medical): No  Physical Activity: Inactive   Days of Exercise per Week: 0 days   Minutes of Exercise per Session: 0 min  Stress: No Stress Concern Present   Feeling of Stress : Only a little  Social Connections: Moderately Isolated   Frequency of Communication with Friends and Family: More than three times a week   Frequency of Social Gatherings with Friends and Family: Patient refused   Attends Religious Services: Never   Marine scientist or Organizations: No   Attends Music therapist: Never   Marital Status: Married    Current Medications:  Current Outpatient Medications:    acetaminophen (TYLENOL) 650 MG CR tablet, Take 1,300 mg by mouth every 8 (eight) hours as needed for pain., Disp: , Rfl:    atenolol (TENORMIN) 50 MG tablet, Take 50 mg by mouth daily., Disp: , Rfl:    atorvastatin (LIPITOR) 40 MG tablet, Take 40 mg by mouth daily., Disp: , Rfl:    benazepril (LOTENSIN) 40 MG tablet, Take 40 mg by mouth daily., Disp: , Rfl:     diclofenac sodium (VOLTAREN) 1 % GEL, Apply 1 application topically 2 (two) times daily as needed (knee pain)., Disp: , Rfl: 5   methocarbamol (ROBAXIN) 500 MG tablet, Take 500 mg by mouth every 6 (six) hours as needed for muscle spasms., Disp: , Rfl:    mirtazapine (REMERON) 15 MG tablet, Take 7.5 mg by mouth at bedtime., Disp: , Rfl:    polyethylene glycol powder (GLYCOLAX/MIRALAX) powder, MIX 1 CAPFUL IN LIQUID AT BEDTIME AS NEEDED FOR CONSTIPATION. (Patient taking differently: Take 1 Container by mouth at bedtime as  needed for moderate constipation.), Disp: 527 g, Rfl: 3   Probiotic Product (PROBIOTIC DAILY) CAPS, Take 1 capsule by mouth daily., Disp: , Rfl:    sodium-potassium bicarbonate (ALKA-SELTZER GOLD) TBEF dissolvable tablet, Take 2 tablets by mouth 2 (two) times daily as needed (heartburn)., Disp: , Rfl:    OVER THE COUNTER MEDICATION, Take 2 capsules by mouth daily. Omega 3/ALA/Chromium, Disp: , Rfl:    senna-docusate (SENOKOT-S) 8.6-50 MG tablet, Take 2 tablets by mouth at bedtime. For AFTER surgery, do not take if having diarrhea (Patient not taking: Reported on 03/14/2021), Disp: 30 tablet, Rfl: 0   traMADol (ULTRAM) 50 MG tablet, Take 1 tablet (50 mg total) by mouth every 6 (six) hours as needed for severe pain. For AFTER surgery only, do not take and drive (Patient not taking: Reported on 03/14/2021), Disp: 10 tablet, Rfl: 0  Review of Systems: Denies appetite changes, fevers, chills, fatigue, unexplained weight changes. Denies hearing loss, neck lumps or masses, mouth sores, ringing in ears or voice changes. Denies cough or wheezing.  Denies shortness of breath. Denies chest pain or palpitations. Denies leg swelling. Denies abdominal distention, pain, blood in stools, constipation, diarrhea, nausea, vomiting, or early satiety. Denies pain with intercourse, dysuria, frequency, hematuria or incontinence. Denies hot flashes, pelvic pain   Denies joint pain, back pain or muscle  pain/cramps. Denies itching, rash, or wounds. Denies dizziness, headaches, numbness or seizures. Denies swollen lymph nodes or glands, denies easy bruising or bleeding. Denies anxiety, depression, confusion, or decreased concentration.  Physical Exam: BP (!) 157/54 (BP Location: Left Arm, Patient Position: Sitting)   Pulse 82   Temp 98.2 F (36.8 C) (Oral)   Resp 18   Ht 5' 5"  (1.651 m)   Wt 288 lb (130.6 kg)   SpO2 98%   BMI 47.93 kg/m  General: Alert, oriented, no acute distress. HEENT: Atraumatic, normocephalic, sclera anicteric. Chest: Unlabored breathing on room air.  Laboratory & Radiologic Studies: None new  Assessment & Plan: Patty Estrada is a 70 y.o. woman with clinical stage 1A grade 1 endometrioid endometrial adenocarcinoma who presents for follow-up in the setting of clinically early grade 1 endometrial cancer being treated with progesterone using a Mirena IUD.  Patient is overall doing very well.  From a symptom standpoint, her bleeding has almost stopped.  She also has been able to achieve almost 25 pounds both by her home scale as well as our scale here in clinic.  She has been able to do this on her own but has a weight loss center near her house and is very motivated to call to get scheduled to see them if she feels like her weight is plateauing.  The patient was congratulated on her progress thus far.  Unfortunately, when we attempted to do her surgery back in mid June, she was not able to be ventilated sufficiently to do robotic surgery.  Because of this, D&C and Mirena IUD was placed.  We discussed the utility of a biopsy today.  The average time to regression of CAH appears to be about 4.8 months.  In the setting of endometrial cancer, a biopsy that continues to show grade 1 endometrial adenocarcinoma would not change our plans at this time.  Given her significant improvement in bleeding, I think it is unlikely that she has had disease progression.  I do not  feel strongly that a biopsy needs to be done today but could be done at her 40-monthvisit.  Patient was offered either biopsy  today (understanding the additional cost of biopsy) versus biopsy in December at her follow-up.  My hope is that she will continue to lose weight and that we may be able to talk about scheduling surgery within the next 3-6 months.   36 minutes of total time was spent for this patient encounter, including preparation, face-to-face counseling with the patient and coordination of care, and documentation of the encounter.  Jeral Pinch, MD  Division of Gynecologic Oncology  Department of Obstetrics and Gynecology  [facility]

## 2021-03-14 ENCOUNTER — Other Ambulatory Visit: Payer: Self-pay

## 2021-03-14 ENCOUNTER — Encounter: Payer: Self-pay | Admitting: Gynecologic Oncology

## 2021-03-14 ENCOUNTER — Inpatient Hospital Stay: Payer: Medicare Other | Attending: Gynecologic Oncology | Admitting: Gynecologic Oncology

## 2021-03-14 VITALS — BP 157/54 | HR 82 | Temp 98.2°F | Resp 18 | Ht 65.0 in | Wt 288.0 lb

## 2021-03-14 DIAGNOSIS — R634 Abnormal weight loss: Secondary | ICD-10-CM | POA: Diagnosis not present

## 2021-03-14 DIAGNOSIS — C541 Malignant neoplasm of endometrium: Secondary | ICD-10-CM | POA: Diagnosis not present

## 2021-03-14 DIAGNOSIS — Z79890 Hormone replacement therapy (postmenopausal): Secondary | ICD-10-CM

## 2021-03-14 NOTE — Patient Instructions (Signed)
It was good to see you today.  I will see you back in 3 months.  At that visit, we will plan on a biopsy.  Depending on biopsy results as well as your weight loss achieved, we will discuss possibility of surgery.  If anything changes between now and that visit, please call the clinic to see me sooner.

## 2021-04-17 DIAGNOSIS — L7211 Pilar cyst: Secondary | ICD-10-CM | POA: Diagnosis not present

## 2021-04-29 DIAGNOSIS — Z23 Encounter for immunization: Secondary | ICD-10-CM | POA: Diagnosis not present

## 2021-05-02 DIAGNOSIS — E1159 Type 2 diabetes mellitus with other circulatory complications: Secondary | ICD-10-CM | POA: Diagnosis not present

## 2021-05-09 DIAGNOSIS — C541 Malignant neoplasm of endometrium: Secondary | ICD-10-CM | POA: Diagnosis not present

## 2021-05-09 DIAGNOSIS — E668 Other obesity: Secondary | ICD-10-CM | POA: Diagnosis not present

## 2021-05-09 DIAGNOSIS — E1169 Type 2 diabetes mellitus with other specified complication: Secondary | ICD-10-CM | POA: Diagnosis not present

## 2021-05-09 DIAGNOSIS — I10 Essential (primary) hypertension: Secondary | ICD-10-CM | POA: Diagnosis not present

## 2021-06-13 ENCOUNTER — Ambulatory Visit: Payer: Medicare Other | Admitting: Gynecologic Oncology

## 2021-07-01 NOTE — Progress Notes (Signed)
Gynecologic Oncology Return Clinic Visit  07/02/21  Reason for Visit: follow-up in the setting of uterine cancer  Treatment History: Oncology History  Endometrial adenocarcinoma ([place])  10/28/2020 Initial Biopsy   EMB: Grade 1 endometrioid adenoca   10/28/2020 Initial Diagnosis   Endometrial adenocarcinoma ([place])   12/19/2020 Surgery   D&C: endometrioid adenoca, grade 1, with associated CAH  IHC MMR intact     Interval History: Patient presents today for follow-up in the setting of clinical stage I endometrial cancer being treated with progestin.  Notes overall doing well since her last visit.  Last vaginal spotting was in October.  Every couple weeks, she will have a small amount of bloody mucus or sediment.  Denies any abdominal or pelvic pain or cramping.  Is having some constipation related to her rectocele, which is happened before when she is on a diet.  Denies any change to bladder symptoms.  Has lost approximately 60 pounds since I first met her the summer.  Past Medical/Surgical History: Past Medical History:  Diagnosis Date   Adenomatous colon polyp 2006       Arthritis    Atrial fibrillation ([place]) 2013   Depression    Dysrhythmia 2013   a-fib   Endometrial cancer ([place])    Fatty liver    ?cirrhosis on u/s in 2008   GERD (gastroesophageal reflux disease)    HTN (hypertension)    Hyperglycemia    Hyperlipidemia    NASH (nonalcoholic steatohepatitis)    Obesity    Peripheral vascular disease ([place]) 2003   Wt was 408 lbs   Pre-diabetes    Urinary incontinence    Vitamin D deficiency     Past Surgical History:  Procedure Laterality Date   COLONOSCOPY  04/09/2005   adenomatous polyps, two diverticula   COLONOSCOPY  12/2010   RMR: left-sided diverticula, adenomatous polyps, next TCS due 12/2015   DILATION AND CURETTAGE OF UTERUS  2015   DILATION AND CURETTAGE OF UTERUS N/A 12/19/2020   Procedure: POSSIBLE DILATATION AND CURETTAGE;  Surgeon: Lafonda Mosses,  MD;  [facility];  Service: Gynecology;  Laterality: N/A;   ESOPHAGOGASTRODUODENOSCOPY  2006   hyperplastic polyps, hh   ESOPHAGOGASTRODUODENOSCOPY  12/2010   RMR:hh, multiple benign gastric polyps,    HYSTEROSCOPY  02/27/2014   Procedure: HYSTEROSCOPY;  Surgeon: Jonnie Kind, MD;  [facility];  Service: Gynecology;;   INTRAUTERINE DEVICE (IUD) INSERTION N/A 12/19/2020   Procedure: POSSIBLE INTRAUTERINE DEVICE (IUD) INSERTION;  Surgeon: Lafonda Mosses, MD;  [facility];  Service: Gynecology;  Laterality: N/A;   LAPAROSCOPY N/A 12/19/2020   Procedure: Diagnostic Laparoscopy;  Surgeon: Lafonda Mosses, MD;  [facility];  Service: Gynecology;  Laterality: N/A;   POLYPECTOMY N/A 02/27/2014   Procedure: REMOVAL OF ENDOCERVICAL POLYP AND ENDOMETRIAL POLYPS;  Surgeon: Jonnie Kind, MD;  Location: [facility];  Service: Gynecology;  Laterality: N/A;   TUBAL LIGATION      Family History  Problem Relation Age of Onset   Breast cancer Mother 97   Heart attack Father 10   Heart disease Brother    Heart attack Brother    Colon cancer Neg Hx    Liver disease Neg Hx    Uterine cancer Neg Hx    Ovarian cancer Neg Hx     Social History   Socioeconomic History   Marital status: Married    Spouse name: Not on file   Number of children: 2   Years of  education: Not on file   Highest education level: Not on file  Occupational History   Occupation: retired  Tobacco Use   Smoking status: Former    Packs/day: 2.00    Years: 13.00    Pack years: 26.00    Types: Cigarettes    Quit date: 07/06/1984    Years since quitting: 37.0   Smokeless tobacco: Never   Tobacco comments:    Quit since 1986  Vaping Use   Vaping Use: Never used  Substance and Sexual Activity   Alcohol use: Yes    Alcohol/week: 0.0 standard drinks    Comment: maybe one or two drinks a year   Drug use: No   Sexual activity: Not Currently    Birth control/protection: Surgical, Post-menopausal  Other  Topics Concern   Not on file  Social History Narrative   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   Social Determinants of Health   Financial Resource Strain: Low Risk    Difficulty of Paying Living Expenses: Not very hard  Food Insecurity: No Food Insecurity   Worried About Charity fundraiser in the Last Year: Never true   Ran Out of Food in the Last Year: Never true  Transportation Needs: No Transportation Needs   Lack of Transportation (Medical): No   Lack of Transportation (Non-Medical): No  Physical Activity: Inactive   Days of Exercise per Week: 0 days   Minutes of Exercise per Session: 0 min  Stress: No Stress Concern Present   Feeling of Stress : Only a little  Social Connections: Moderately Isolated   Frequency of Communication with Friends and Family: More than three times a week   Frequency of Social Gatherings with Friends and Family: Patient refused   Attends Religious Services: Never   Marine scientist or Organizations: No   Attends Music therapist: Never   Marital Status: Married    Current Medications:  Current Outpatient Medications:    acetaminophen (TYLENOL) 650 MG CR tablet, Take 1,300 mg by mouth every 8 (eight) hours as needed for pain., Disp: , Rfl:    atenolol (TENORMIN) 50 MG tablet, Take 50 mg by mouth daily., Disp: , Rfl:    atorvastatin (LIPITOR) 40 MG tablet, Take 40 mg by mouth daily., Disp: , Rfl:    benazepril (LOTENSIN) 40 MG tablet, Take 40 mg by mouth daily., Disp: , Rfl:    diclofenac sodium (VOLTAREN) 1 % GEL, Apply 1 application topically 2 (two) times daily as needed (knee pain)., Disp: , Rfl: 5   mirtazapine (REMERON) 15 MG tablet, Take 7.5 mg by mouth at bedtime., Disp: , Rfl:    Multiple Vitamin (MULTIVITAMIN) tablet, Take 1 tablet by mouth daily. Centrum Silver Multivitamin, Disp: , Rfl:    OVER THE COUNTER MEDICATION, Take 2 capsules by mouth daily. Omega 3/ALA/Chromium, Disp: , Rfl:    polyethylene glycol powder  (GLYCOLAX/MIRALAX) powder, MIX 1 CAPFUL IN LIQUID AT BEDTIME AS NEEDED FOR CONSTIPATION. (Patient taking differently: Take 1 Container by mouth at bedtime as needed for moderate constipation.), Disp: 527 g, Rfl: 3   Probiotic Product (PROBIOTIC DAILY) CAPS, Take 1 capsule by mouth daily., Disp: , Rfl:    sodium-potassium bicarbonate (ALKA-SELTZER GOLD) TBEF dissolvable tablet, Take 2 tablets by mouth 2 (two) times daily as needed (heartburn)., Disp: , Rfl:    methocarbamol (ROBAXIN) 500 MG tablet, Take 500 mg by mouth every 6 (six) hours as needed for muscle spasms. (Patient not taking: Reported on 07/02/2021), Disp: ,  Rfl:    senna-docusate (SENOKOT-S) 8.6-50 MG tablet, Take 2 tablets by mouth at bedtime. For AFTER surgery, do not take if having diarrhea (Patient not taking: Reported on 03/14/2021), Disp: 30 tablet, Rfl: 0   traMADol (ULTRAM) 50 MG tablet, Take 1 tablet (50 mg total) by mouth every 6 (six) hours as needed for severe pain. For AFTER surgery only, do not take and drive (Patient not taking: Reported on 03/14/2021), Disp: 10 tablet, Rfl: 0  Review of Systems: Denies appetite changes, fevers, chills, fatigue, unexplained weight changes. Denies hearing loss, neck lumps or masses, mouth sores, ringing in ears or voice changes. Denies cough or wheezing.  Denies shortness of breath. Denies chest pain or palpitations. Denies leg swelling. Denies abdominal distention, pain, blood in stools, constipation, diarrhea, nausea, vomiting, or early satiety. Denies pain with intercourse, dysuria, frequency, hematuria or incontinence. Denies hot flashes, pelvic pain, vaginal bleeding or vaginal discharge.   Denies joint pain, back pain or muscle pain/cramps. Denies itching, rash, or wounds. Denies dizziness, headaches, numbness or seizures. Denies swollen lymph nodes or glands, denies easy bruising or bleeding. Denies anxiety, depression, confusion, or decreased concentration.  Physical Exam: BP (!)  165/56 (BP Location: Left Arm, Patient Position: Sitting)    Pulse 85    Temp 97.8 F (36.6 C) (Tympanic)    Resp 18    Ht 5' 5"  (1.651 m)    Wt 254 lb (115.2 kg)    SpO2 99%    BMI 42.27 kg/m  General: Alert, oriented, no acute distress. HEENT: Normocephalic, atraumatic, sclera anicteric. Chest: Clear to auscultation bilaterally.  No wheezes or rhonchi. Cardiovascular: Regular rate and rhythm, no murmurs. Abdomen: Obese, soft, nontender.  Normoactive bowel sounds.  No masses or hepatosplenomegaly appreciated.  Well-healed incision.  Laxity of midline abdominal wall above the umbilicus. Extremities: Grossly normal range of motion.  Warm, well perfused.  No edema bilaterally. Skin: No rashes or lesions noted. Lymphatics: No cervical, supraclavicular, or inguinal adenopathy. GU: Normal appearing external genitalia without erythema, excoriation, or lesions.  Speculum exam reveals mildly atrophic vaginal mucosa, no lesions or masses.  IUD strings visible approximately 3-4 cm protruding from the cervical os.  Cervix normal in appearance.  Bimanual exam reveals 10 cm mobile uterus, no nodularity appreciated.    Endometrial biopsy: Preoperative diagnosis: Endometrial cancer Postoperative diagnosis: Same as above Physician: Berline Lopes MD Estimated blood loss: Minimal Specimens: Endometrial biopsy Procedure: After the procedure was discussed with the patient including risks and benefits, patient gave verbal consent.  She was then placed in dorsolithotomy position and a speculum was placed in the vagina.  Once the cervix was well visualized, it was cleansed with Betadine x3.  IUD strings were visible.  Endometrial Pipelle was passed through the cervix to a depth of 9 cm.  2 passes were performed with mostly mucus 7 clear fluid noted on first pass and some blood and tissue noted on second pass.  Specimen was placed in formalin to be sent to pathology.  All instruments removed from the vagina after assuring  hemostasis.  Overall the patient tolerated the procedure well.  Laboratory & Radiologic Studies: None new  Assessment & Plan: Patty Estrada is a 70 y.o. woman with clinical stage 1A grade 1 endometrioid endometrial adenocarcinoma who presents for follow-up in the setting of clinically early grade 1 endometrial cancer being treated with progesterone using a Mirena IUD.  Patient is doing very well and has successfully lost approximately 60 pounds.  Has been relatively asymptomatic with  regard to her endometrial cancer for the last 3 months.  Biopsy performed today.  Ideally, she would lose a little bit more weight before we proceed with definitive surgery.  We will discuss further once her biopsy is back.  If she has had some regression of her lesion, then we will wait another 3 months to see if she can achieve another 20 to 25 pound weight loss before we start planning for surgery.  I am hopeful that if we get her BMI under 40, she will tolerate Trendelenburg.   Patient will be tentatively scheduled for follow-up in 3 months.  32 minutes of total time was spent for this patient encounter, including preparation, face-to-face counseling with the patient and coordination of care, and documentation of the encounter.  Jeral Pinch, MD  Division of Gynecologic Oncology  Department of Obstetrics and Gynecology  [facility]

## 2021-07-02 ENCOUNTER — Other Ambulatory Visit: Payer: Self-pay

## 2021-07-02 ENCOUNTER — Inpatient Hospital Stay: Payer: Medicare Other | Attending: Gynecologic Oncology | Admitting: Gynecologic Oncology

## 2021-07-02 ENCOUNTER — Encounter: Payer: Self-pay | Admitting: Gynecologic Oncology

## 2021-07-02 VITALS — BP 165/56 | HR 85 | Temp 97.8°F | Resp 18 | Ht 65.0 in | Wt 254.0 lb

## 2021-07-02 DIAGNOSIS — N816 Rectocele: Secondary | ICD-10-CM | POA: Diagnosis not present

## 2021-07-02 DIAGNOSIS — Z6841 Body Mass Index (BMI) 40.0 and over, adult: Secondary | ICD-10-CM | POA: Diagnosis not present

## 2021-07-02 DIAGNOSIS — I4891 Unspecified atrial fibrillation: Secondary | ICD-10-CM | POA: Insufficient documentation

## 2021-07-02 DIAGNOSIS — K219 Gastro-esophageal reflux disease without esophagitis: Secondary | ICD-10-CM | POA: Insufficient documentation

## 2021-07-02 DIAGNOSIS — Z79890 Hormone replacement therapy (postmenopausal): Secondary | ICD-10-CM | POA: Insufficient documentation

## 2021-07-02 DIAGNOSIS — F32A Depression, unspecified: Secondary | ICD-10-CM | POA: Insufficient documentation

## 2021-07-02 DIAGNOSIS — E785 Hyperlipidemia, unspecified: Secondary | ICD-10-CM | POA: Insufficient documentation

## 2021-07-02 DIAGNOSIS — I10 Essential (primary) hypertension: Secondary | ICD-10-CM | POA: Diagnosis not present

## 2021-07-02 DIAGNOSIS — I739 Peripheral vascular disease, unspecified: Secondary | ICD-10-CM | POA: Diagnosis not present

## 2021-07-02 DIAGNOSIS — Z79899 Other long term (current) drug therapy: Secondary | ICD-10-CM | POA: Diagnosis not present

## 2021-07-02 DIAGNOSIS — K7581 Nonalcoholic steatohepatitis (NASH): Secondary | ICD-10-CM | POA: Insufficient documentation

## 2021-07-02 DIAGNOSIS — K5900 Constipation, unspecified: Secondary | ICD-10-CM | POA: Diagnosis not present

## 2021-07-02 DIAGNOSIS — E559 Vitamin D deficiency, unspecified: Secondary | ICD-10-CM | POA: Diagnosis not present

## 2021-07-02 DIAGNOSIS — C541 Malignant neoplasm of endometrium: Secondary | ICD-10-CM

## 2021-07-02 NOTE — Patient Instructions (Signed)
It was good to see you today!  I will call you when I have your biopsy results back, hopefully either later this week or early next week.  Depending on what they show, we will plan for follow-up in 3 months as we have scheduled.  If you have continued weight loss, we may be able to talk about scheduling hysterectomy at that next visit.

## 2021-07-04 ENCOUNTER — Telehealth: Payer: Self-pay | Admitting: Gynecologic Oncology

## 2021-07-04 LAB — SURGICAL PATHOLOGY

## 2021-07-04 NOTE — Telephone Encounter (Signed)
Called patient to discuss biopsy.  There is some residual grade 1 endometrial cancer in the background of hyperplasia.  Discussed options including moving forward with surgery now versus continuing with progesterone therapy for another 3 months and then repeating a biopsy.  If biopsy shows regression, then can delay surgery further to allow her to work towards more weight loss.  If biopsy were to show persistent cancer at that point, I would recommend that we proceed with definitive surgery.  The patient strongly wishes to work on losing some additional weight, which I think will help set her up for success at the time of repeat attempt at surgery.  I have asked her to contact me if any symptoms change.  Otherwise I will see her as planned at the end of March.  Jeral Pinch MD Gynecologic Oncology

## 2021-07-11 ENCOUNTER — Telehealth: Payer: Self-pay

## 2021-07-11 NOTE — Telephone Encounter (Signed)
Attempted to reach patient to follow up on upcoming appointment and surgery scheduling. Left message requesting return call.

## 2021-07-14 ENCOUNTER — Telehealth: Payer: Self-pay | Admitting: *Deleted

## 2021-07-14 NOTE — Telephone Encounter (Signed)
Spoke with Ms. Laba this afternoon. Confirmed follow up appointment with Dr. Berline Lopes on 10/02/21 at [place] patient that her surgery is scheduled for 10/14/21, surgery time is reserved for her but can always be adjusted. Patient states that her husband runs a tax business and would prefer that surgery be the following week after the tax deadline. Advised patient that our office will work on this and we will be in touch with a new surgery date.

## 2021-07-14 NOTE — Telephone Encounter (Signed)
Pre [facility] APP move the patient's appt from late March to early-mid March

## 2021-07-14 NOTE — Telephone Encounter (Signed)
Following up with Patty Estrada. Offered patient surgery date of 10/21/21. Patient is agreeable to this and states her husband usually has that time off and can take care of her. Advised patient that she may be receiving a call from Pre-Admission testing to arrange an appointment. Instructed patient to call with any questions or concerns. Patient verbalized understanding.

## 2021-07-15 ENCOUNTER — Telehealth: Payer: Self-pay | Admitting: *Deleted

## 2021-07-15 NOTE — Telephone Encounter (Signed)
Records and surgical optimization form faxed to the patient's PCP office

## 2021-07-24 DIAGNOSIS — L7211 Pilar cyst: Secondary | ICD-10-CM | POA: Diagnosis not present

## 2021-09-05 DIAGNOSIS — C541 Malignant neoplasm of endometrium: Secondary | ICD-10-CM | POA: Diagnosis not present

## 2021-09-05 DIAGNOSIS — I10 Essential (primary) hypertension: Secondary | ICD-10-CM | POA: Diagnosis not present

## 2021-09-05 DIAGNOSIS — E1159 Type 2 diabetes mellitus with other circulatory complications: Secondary | ICD-10-CM | POA: Diagnosis not present

## 2021-09-12 DIAGNOSIS — C540 Malignant neoplasm of isthmus uteri: Secondary | ICD-10-CM | POA: Diagnosis not present

## 2021-09-12 DIAGNOSIS — E785 Hyperlipidemia, unspecified: Secondary | ICD-10-CM | POA: Diagnosis not present

## 2021-09-12 DIAGNOSIS — E661 Drug-induced obesity: Secondary | ICD-10-CM | POA: Diagnosis not present

## 2021-09-26 ENCOUNTER — Encounter: Payer: Self-pay | Admitting: Gynecologic Oncology

## 2021-10-02 ENCOUNTER — Encounter: Payer: Self-pay | Admitting: Gynecologic Oncology

## 2021-10-02 ENCOUNTER — Inpatient Hospital Stay (HOSPITAL_BASED_OUTPATIENT_CLINIC_OR_DEPARTMENT_OTHER): Payer: Medicare Other | Admitting: Gynecologic Oncology

## 2021-10-02 ENCOUNTER — Inpatient Hospital Stay: Payer: Medicare Other | Attending: Gynecologic Oncology | Admitting: Gynecologic Oncology

## 2021-10-02 ENCOUNTER — Other Ambulatory Visit: Payer: Self-pay

## 2021-10-02 VITALS — BP 145/49 | HR 80 | Temp 98.3°F | Resp 16 | Ht 66.0 in | Wt 248.4 lb

## 2021-10-02 DIAGNOSIS — C541 Malignant neoplasm of endometrium: Secondary | ICD-10-CM

## 2021-10-02 DIAGNOSIS — Z79890 Hormone replacement therapy (postmenopausal): Secondary | ICD-10-CM | POA: Diagnosis not present

## 2021-10-02 NOTE — Progress Notes (Signed)
Gynecologic Oncology Return Clinic Visit ? ?10/02/2021 ? ?Reason for Visit: follow-up in the setting of uterine cancer ? ?Treatment History: ?Oncology History  ?Endometrial adenocarcinoma ([place])  ?10/28/2020 Initial Biopsy  ? EMB: Grade 1 endometrioid adenoca ?  ?10/28/2020 Initial Diagnosis  ? Endometrial adenocarcinoma [facility]) ?  ?12/19/2020 Surgery  ? D&C: endometrioid adenoca, grade 1, with associated CAH ? ?IHC MMR intact ?  ?MMR intact ? ?Endometrial biopsy on 07/02/2021: Grade 1 endometrioid adenocarcinoma associated with complex atypical hyperplasia. ? ?Interval History: ?Doing well.  Had minimal spotting after her last biopsy.  Denies any further vaginal bleeding.  Denies pelvic pain or cramping. ? ?By her home scale, is down to 244 pounds (started at 310), continues to work on diet changes and exercise.  Is hoping to lose at least another 5 pounds. ? ?Past Medical/Surgical History: ?Past Medical History:  ?Diagnosis Date  ? Adenomatous colon polyp 2006  ?    ? Arthritis   ? Atrial fibrillation ([place]) 2013  ? Depression   ? Dysrhythmia 2013  ? a-fib  ? Endometrial cancer ([place])   ? Fatty liver   ? ?cirrhosis on u/s in 2008  ? GERD (gastroesophageal reflux disease)   ? HTN (hypertension)   ? Hyperglycemia   ? Hyperlipidemia   ? NASH (nonalcoholic steatohepatitis)   ? Obesity   ? Peripheral vascular disease ([place]) 2003  ? Wt was 408 lbs  ? Pre-diabetes   ? Urinary incontinence   ? Vitamin D deficiency   ? ? ?Past Surgical History:  ?Procedure Laterality Date  ? COLONOSCOPY  04/09/2005  ? adenomatous polyps, two diverticula  ? COLONOSCOPY  12/2010  ? RMR: left-sided diverticula, adenomatous polyps, next TCS due 12/2015  ? DILATION AND CURETTAGE OF UTERUS  2015  ? DILATION AND CURETTAGE OF UTERUS N/A 12/19/2020  ? Procedure: POSSIBLE DILATATION AND CURETTAGE;  Surgeon: Lafonda Mosses, MD;  Location: [facility];  Service: Gynecology;  Laterality: N/A;  ? ESOPHAGOGASTRODUODENOSCOPY  2006  ? hyperplastic polyps, hh  ?  ESOPHAGOGASTRODUODENOSCOPY  12/2010  ? RMR:hh, multiple benign gastric polyps,   ? HYSTEROSCOPY  02/27/2014  ? Procedure: HYSTEROSCOPY;  Surgeon: Jonnie Kind, MD;  Location: [facility];  Service: Gynecology;;  ? INTRAUTERINE DEVICE (IUD) INSERTION N/A 12/19/2020  ? Procedure: POSSIBLE INTRAUTERINE DEVICE (IUD) INSERTION;  Surgeon: Lafonda Mosses, MD;  Location: [facility];  Service: Gynecology;  Laterality: N/A;  ? LAPAROSCOPY N/A 12/19/2020  ? Procedure: Diagnostic Laparoscopy;  Surgeon: Lafonda Mosses, MD;  Location: [facility];  Service: Gynecology;  Laterality: N/A;  ? POLYPECTOMY N/A 02/27/2014  ? Procedure: REMOVAL OF ENDOCERVICAL POLYP AND ENDOMETRIAL POLYPS;  Surgeon: Jonnie Kind, MD;  Location: [facility];  Service: Gynecology;  Laterality: N/A;  ? TUBAL LIGATION    ? ? ?Family History  ?Problem Relation Age of Onset  ? Breast cancer Mother 63  ? Heart attack Father 18  ? Heart disease Brother   ? Heart attack Brother   ? Colon cancer Neg Hx   ? Liver disease Neg Hx   ? Uterine cancer Neg Hx   ? Ovarian cancer Neg Hx   ? ? ?Social History  ? ?Socioeconomic History  ? Marital status: Married  ?  Spouse name: Not on file  ? Number of children: 2  ? Years of education: Not on file  ? Highest education level: Not on file  ?Occupational History  ? Occupation: retired  ?Tobacco Use  ? Smoking status: Former  ?  Packs/day: 2.00  ?  Years: 13.00  ?  Pack years: 26.00  ?  Types: Cigarettes  ?  Quit date: 07/06/1984  ?  Years since quitting: 37.2  ? Smokeless tobacco: Never  ? Tobacco comments:  ?  Quit since 1986  ?Vaping Use  ? Vaping Use: Never used  ?Substance and Sexual Activity  ? Alcohol use: Yes  ?  Alcohol/week: 0.0 standard drinks  ?  Comment: maybe one or two drinks a year  ? Drug use: No  ? Sexual activity: Not Currently  ?  Birth control/protection: Surgical, Post-menopausal  ?Other Topics Concern  ? Not on file  ?Social History Narrative  ? 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  ? ?Social Determinants of  Health  ? ?Financial Resource Strain: Low Risk   ? Difficulty of Paying Living Expenses: Not very hard  ?Food Insecurity: No Food Insecurity  ? Worried About Charity fundraiser in the Last Year: Never true  ? Ran Out of Food in the Last Year: Never true  ?Transportation Needs: No Transportation Needs  ? Lack of Transportation (Medical): No  ? Lack of Transportation (Non-Medical): No  ?Physical Activity: Inactive  ? Days of Exercise per Week: 0 days  ? Minutes of Exercise per Session: 0 min  ?Stress: No Stress Concern Present  ? Feeling of Stress : Only a little  ?Social Connections: Moderately Isolated  ? Frequency of Communication with Friends and Family: More than three times a week  ? Frequency of Social Gatherings with Friends and Family: Patient refused  ? Attends Religious Services: Never  ? Active Member of Clubs or Organizations: No  ? Attends Archivist Meetings: Never  ? Marital Status: Married  ? ? ?Current Medications: ? ?Current Outpatient Medications:  ?  acetaminophen (TYLENOL) 650 MG CR tablet, Take 1,300 mg by mouth every 8 (eight) hours as needed for pain., Disp: , Rfl:  ?  atenolol (TENORMIN) 50 MG tablet, Take 50 mg by mouth daily., Disp: , Rfl:  ?  atorvastatin (LIPITOR) 40 MG tablet, Take 40 mg by mouth daily., Disp: , Rfl:  ?  benazepril (LOTENSIN) 40 MG tablet, Take 40 mg by mouth daily., Disp: , Rfl:  ?  diclofenac sodium (VOLTAREN) 1 % GEL, Apply 1 application topically 2 (two) times daily as needed (knee pain)., Disp: , Rfl: 5 ?  methocarbamol (ROBAXIN) 500 MG tablet, Take 500 mg by mouth every 6 (six) hours as needed for muscle spasms., Disp: , Rfl:  ?  mirtazapine (REMERON) 15 MG tablet, Take 15 mg by mouth at bedtime., Disp: , Rfl:  ?  Multiple Vitamin (MULTIVITAMIN) tablet, Take 1 tablet by mouth daily. Centrum Silver Multivitamin, Disp: , Rfl:  ?  OVER THE COUNTER MEDICATION, Take by mouth daily. Super Beet 2 chews daily, Disp: , Rfl:  ?  polyethylene glycol powder  (GLYCOLAX/MIRALAX) powder, MIX 1 CAPFUL IN LIQUID AT BEDTIME AS NEEDED FOR CONSTIPATION. (Patient taking differently: Take 1 Container by mouth at bedtime as needed for moderate constipation.), Disp: 527 g, Rfl: 3 ?  sodium-potassium bicarbonate (ALKA-SELTZER GOLD) TBEF dissolvable tablet, Take 2 tablets by mouth 2 (two) times daily as needed (heartburn)., Disp: , Rfl:  ?  OVER THE COUNTER MEDICATION, Take 2 capsules by mouth daily. Omega 3/ALA/Chromium (Patient not taking: Reported on 09/26/2021), Disp: , Rfl:  ?  Probiotic Product (PROBIOTIC DAILY) CAPS, Take 1 capsule by mouth daily. (Patient not taking: Reported on 09/26/2021), Disp: , Rfl:  ? ?Review of  Systems: ?Denies appetite changes, fevers, chills, fatigue, unexplained weight changes. ?Denies hearing loss, neck lumps or masses, mouth sores, ringing in ears or voice changes. ?Denies cough or wheezing.  Denies shortness of breath. ?Denies chest pain or palpitations. Denies leg swelling. ?Denies abdominal distention, pain, blood in stools, constipation, diarrhea, nausea, vomiting, or early satiety. ?Denies pain with intercourse, dysuria, frequency, hematuria or incontinence. ?Denies hot flashes, pelvic pain, vaginal bleeding or vaginal discharge.   ?Denies joint pain, back pain or muscle pain/cramps. ?Denies itching, rash, or wounds. ?Denies dizziness, headaches, numbness or seizures. ?Denies swollen lymph nodes or glands, denies easy bruising or bleeding. ?Denies anxiety, depression, confusion, or decreased concentration. ? ?Physical Exam: ?BP (!) 145/49 (BP Location: Left Arm, Patient Position: Sitting)   Pulse 80   Temp 98.3 ?F (36.8 ?C) (Oral)   Resp 16   Ht 5' 6"  (1.676 m)   Wt 248 lb 6.4 oz (112.7 kg)   SpO2 98%   BMI 40.09 kg/m?  ?General: Alert, oriented, no acute distress. ?HEENT: Normocephalic, atraumatic, sclera anicteric. ?Chest: Unlabored breathing on room air. ?Abdomen: Obese, soft, nontender.  Normoactive bowel sounds.  No masses or  hepatosplenomegaly appreciated. ?Extremities: Grossly normal range of motion.  Warm, well perfused.  No edema bilaterally. ?Skin: No rashes or lesions noted. ?Lymphatics: No cervical, supraclavicular, or inguinal adenopathy. ?GU:

## 2021-10-02 NOTE — H&P (View-Only) (Signed)
Gynecologic Oncology Return Clinic Visit ? ?10/02/2021 ? ?Reason for Visit: follow-up in the setting of uterine cancer ? ?Treatment History: ?Oncology History  ?Endometrial adenocarcinoma ([place])  ?10/28/2020 Initial Biopsy  ? EMB: Grade 1 endometrioid adenoca ?  ?10/28/2020 Initial Diagnosis  ? Endometrial adenocarcinoma [facility]) ?  ?12/19/2020 Surgery  ? D&C: endometrioid adenoca, grade 1, with associated CAH ? ?IHC MMR intact ?  ?MMR intact ? ?Endometrial biopsy on 07/02/2021: Grade 1 endometrioid adenocarcinoma associated with complex atypical hyperplasia. ? ?Interval History: ?Doing well.  Had minimal spotting after her last biopsy.  Denies any further vaginal bleeding.  Denies pelvic pain or cramping. ? ?By her home scale, is down to 244 pounds (started at 310), continues to work on diet changes and exercise.  Is hoping to lose at least another 5 pounds. ? ?Past Medical/Surgical History: ?Past Medical History:  ?Diagnosis Date  ? Adenomatous colon polyp 2006  ?    ? Arthritis   ? Atrial fibrillation ([place]) 2013  ? Depression   ? Dysrhythmia 2013  ? a-fib  ? Endometrial cancer ([place])   ? Fatty liver   ? ?cirrhosis on u/s in 2008  ? GERD (gastroesophageal reflux disease)   ? HTN (hypertension)   ? Hyperglycemia   ? Hyperlipidemia   ? NASH (nonalcoholic steatohepatitis)   ? Obesity   ? Peripheral vascular disease ([place]) 2003  ? Wt was 408 lbs  ? Pre-diabetes   ? Urinary incontinence   ? Vitamin D deficiency   ? ? ?Past Surgical History:  ?Procedure Laterality Date  ? COLONOSCOPY  04/09/2005  ? adenomatous polyps, two diverticula  ? COLONOSCOPY  12/2010  ? RMR: left-sided diverticula, adenomatous polyps, next TCS due 12/2015  ? DILATION AND CURETTAGE OF UTERUS  2015  ? DILATION AND CURETTAGE OF UTERUS N/A 12/19/2020  ? Procedure: POSSIBLE DILATATION AND CURETTAGE;  Surgeon: Lafonda Mosses, MD;  [facility];  Service: Gynecology;  Laterality: N/A;  ? ESOPHAGOGASTRODUODENOSCOPY  2006  ? hyperplastic polyps, hh  ?  ESOPHAGOGASTRODUODENOSCOPY  12/2010  ? RMR:hh, multiple benign gastric polyps,   ? HYSTEROSCOPY  02/27/2014  ? Procedure: HYSTEROSCOPY;  Surgeon: Jonnie Kind, MD;  Location: [facility];  Service: Gynecology;;  ? INTRAUTERINE DEVICE (IUD) INSERTION N/A 12/19/2020  ? Procedure: POSSIBLE INTRAUTERINE DEVICE (IUD) INSERTION;  Surgeon: Lafonda Mosses, MD;  [facility];  Service: Gynecology;  Laterality: N/A;  ? LAPAROSCOPY N/A 12/19/2020  ? Procedure: Diagnostic Laparoscopy;  Surgeon: Lafonda Mosses, MD;  Location: [facility];  Service: Gynecology;  Laterality: N/A;  ? POLYPECTOMY N/A 02/27/2014  ? Procedure: REMOVAL OF ENDOCERVICAL POLYP AND ENDOMETRIAL POLYPS;  Surgeon: Jonnie Kind, MD;  Location: [facility];  Service: Gynecology;  Laterality: N/A;  ? TUBAL LIGATION    ? ? ?Family History  ?Problem Relation Age of Onset  ? Breast cancer Mother 44  ? Heart attack Father 37  ? Heart disease Brother   ? Heart attack Brother   ? Colon cancer Neg Hx   ? Liver disease Neg Hx   ? Uterine cancer Neg Hx   ? Ovarian cancer Neg Hx   ? ? ?Social History  ? ?Socioeconomic History  ? Marital status: Married  ?  Spouse name: Not on file  ? Number of children: 2  ? Years of education: Not on file  ? Highest education level: Not on file  ?Occupational History  ? Occupation: retired  ?Tobacco Use  ? Smoking status: Former  ?  Packs/day: 2.00  ?  Years: 13.00  ?  Pack years: 26.00  ?  Types: Cigarettes  ?  Quit date: 07/06/1984  ?  Years since quitting: 37.2  ? Smokeless tobacco: Never  ? Tobacco comments:  ?  Quit since 1986  ?Vaping Use  ? Vaping Use: Never used  ?Substance and Sexual Activity  ? Alcohol use: Yes  ?  Alcohol/week: 0.0 standard drinks  ?  Comment: maybe one or two drinks a year  ? Drug use: No  ? Sexual activity: Not Currently  ?  Birth control/protection: Surgical, Post-menopausal  ?Other Topics Concern  ? Not on file  ?Social History Narrative  ? 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  ? ?Social Determinants of  Health  ? ?Financial Resource Strain: Low Risk   ? Difficulty of Paying Living Expenses: Not very hard  ?Food Insecurity: No Food Insecurity  ? Worried About Charity fundraiser in the Last Year: Never true  ? Ran Out of Food in the Last Year: Never true  ?Transportation Needs: No Transportation Needs  ? Lack of Transportation (Medical): No  ? Lack of Transportation (Non-Medical): No  ?Physical Activity: Inactive  ? Days of Exercise per Week: 0 days  ? Minutes of Exercise per Session: 0 min  ?Stress: No Stress Concern Present  ? Feeling of Stress : Only a little  ?Social Connections: Moderately Isolated  ? Frequency of Communication with Friends and Family: More than three times a week  ? Frequency of Social Gatherings with Friends and Family: Patient refused  ? Attends Religious Services: Never  ? Active Member of Clubs or Organizations: No  ? Attends Archivist Meetings: Never  ? Marital Status: Married  ? ? ?Current Medications: ? ?Current Outpatient Medications:  ?  acetaminophen (TYLENOL) 650 MG CR tablet, Take 1,300 mg by mouth every 8 (eight) hours as needed for pain., Disp: , Rfl:  ?  atenolol (TENORMIN) 50 MG tablet, Take 50 mg by mouth daily., Disp: , Rfl:  ?  atorvastatin (LIPITOR) 40 MG tablet, Take 40 mg by mouth daily., Disp: , Rfl:  ?  benazepril (LOTENSIN) 40 MG tablet, Take 40 mg by mouth daily., Disp: , Rfl:  ?  diclofenac sodium (VOLTAREN) 1 % GEL, Apply 1 application topically 2 (two) times daily as needed (knee pain)., Disp: , Rfl: 5 ?  methocarbamol (ROBAXIN) 500 MG tablet, Take 500 mg by mouth every 6 (six) hours as needed for muscle spasms., Disp: , Rfl:  ?  mirtazapine (REMERON) 15 MG tablet, Take 15 mg by mouth at bedtime., Disp: , Rfl:  ?  Multiple Vitamin (MULTIVITAMIN) tablet, Take 1 tablet by mouth daily. Centrum Silver Multivitamin, Disp: , Rfl:  ?  OVER THE COUNTER MEDICATION, Take by mouth daily. Super Beet 2 chews daily, Disp: , Rfl:  ?  polyethylene glycol powder  (GLYCOLAX/MIRALAX) powder, MIX 1 CAPFUL IN LIQUID AT BEDTIME AS NEEDED FOR CONSTIPATION. (Patient taking differently: Take 1 Container by mouth at bedtime as needed for moderate constipation.), Disp: 527 g, Rfl: 3 ?  sodium-potassium bicarbonate (ALKA-SELTZER GOLD) TBEF dissolvable tablet, Take 2 tablets by mouth 2 (two) times daily as needed (heartburn)., Disp: , Rfl:  ?  OVER THE COUNTER MEDICATION, Take 2 capsules by mouth daily. Omega 3/ALA/Chromium (Patient not taking: Reported on 09/26/2021), Disp: , Rfl:  ?  Probiotic Product (PROBIOTIC DAILY) CAPS, Take 1 capsule by mouth daily. (Patient not taking: Reported on 09/26/2021), Disp: , Rfl:  ? ?Review of  Systems: ?Denies appetite changes, fevers, chills, fatigue, unexplained weight changes. ?Denies hearing loss, neck lumps or masses, mouth sores, ringing in ears or voice changes. ?Denies cough or wheezing.  Denies shortness of breath. ?Denies chest pain or palpitations. Denies leg swelling. ?Denies abdominal distention, pain, blood in stools, constipation, diarrhea, nausea, vomiting, or early satiety. ?Denies pain with intercourse, dysuria, frequency, hematuria or incontinence. ?Denies hot flashes, pelvic pain, vaginal bleeding or vaginal discharge.   ?Denies joint pain, back pain or muscle pain/cramps. ?Denies itching, rash, or wounds. ?Denies dizziness, headaches, numbness or seizures. ?Denies swollen lymph nodes or glands, denies easy bruising or bleeding. ?Denies anxiety, depression, confusion, or decreased concentration. ? ?Physical Exam: ?BP (!) 145/49 (BP Location: Left Arm, Patient Position: Sitting)   Pulse 80   Temp 98.3 ?F (36.8 ?C) (Oral)   Resp 16   Ht 5' 6"  (1.676 m)   Wt 248 lb 6.4 oz (112.7 kg)   SpO2 98%   BMI 40.09 kg/m?  ?General: Alert, oriented, no acute distress. ?HEENT: Normocephalic, atraumatic, sclera anicteric. ?Chest: Unlabored breathing on room air. ?Abdomen: Obese, soft, nontender.  Normoactive bowel sounds.  No masses or  hepatosplenomegaly appreciated. ?Extremities: Grossly normal range of motion.  Warm, well perfused.  No edema bilaterally. ?Skin: No rashes or lesions noted. ?Lymphatics: No cervical, supraclavicular, or inguinal adenopathy. ?GU:

## 2021-10-02 NOTE — Patient Instructions (Signed)
Dr. Berline Lopes will contact you with the results of your endometrial biopsy from today. If no cancer identified, surgery will be post-poned. If cancer present, proceed as scheduled with surgery. ? ?Preparing for your Surgery ? ?Plan for surgery on October 21, 2021 with Dr. Jeral Pinch at [place] will be scheduled for robotic assisted total laparoscopic hysterectomy (removal of the uterus and cervix), bilateral salpingo-oophorectomy (removal of both ovaries and fallopian tubes), sentinel lymph node biopsy, possible lymph node dissection, possible laparotomy (larger incision on your abdomen if needed).  ? ?Pre-operative Testing ?-(DONE, scheduled 4/12) You will receive a phone call from presurgical testing at [facility] to arrange for a pre-operative appointment and lab work. ? ?-Bring your insurance card, copy of an advanced directive if applicable, medication list ? ?-At that visit, you will be asked to sign a consent for a possible blood transfusion in case a transfusion becomes necessary during surgery.  The need for a blood transfusion is rare but having consent is a necessary part of your care.    ? ?-You should not be taking blood thinners or aspirin at least ten days prior to surgery unless instructed by your surgeon. ? ?-Do not take supplements such as fish oil (omega 3), red yeast rice, turmeric before your surgery. You want to avoid medications with aspirin in them including headache powders such as BC or Goody's), Excedrin migraine. ? ?Day Before Surgery at Home ?-You will be asked to take in a light diet the day before surgery. You will be advised you can have clear liquids up until 3 hours before your surgery.   ? ?Eat a light diet the day before surgery.  Examples including soups, broths, toast, yogurt, mashed potatoes.  AVOID GAS PRODUCING FOODS. Things to avoid include carbonated beverages (fizzy beverages, sodas), raw fruits and raw vegetables (uncooked), or beans.  ? ?If  your bowels are filled with gas, your surgeon will have difficulty visualizing your pelvic organs which increases your surgical risks. ? ?Your role in recovery ?Your role is to become active as soon as directed by your doctor, while still giving yourself time to heal.  Rest when you feel tired. You will be asked to do the following in order to speed your recovery: ? ?- Cough and breathe deeply. This helps to clear and expand your lungs and can prevent pneumonia after surgery.  ?- STAY ACTIVE WHEN YOU GET HOME. Do mild physical activity. Walking or moving your legs help your circulation and body functions return to normal. Do not try to get up or walk alone the first time after surgery.   ?-If you develop swelling on one leg or the other, pain in the back of your leg, redness/warmth in one of your legs, please call the office or go to the Emergency Room to have a doppler to rule out a blood clot. For shortness of breath, chest pain-seek care in the Emergency Room as soon as possible. ?- Actively manage your pain. Managing your pain lets you move in comfort. We will ask you to rate your pain on a scale of zero to 10. It is your responsibility to tell your doctor or nurse where and how much you hurt so your pain can be treated. ? ?Special Considerations ?-If you are diabetic, you may be placed on insulin after surgery to have closer control over your blood sugars to promote healing and recovery.  This does not mean that you will be discharged on insulin.  If applicable, your oral antidiabetics will be resumed when you are tolerating a solid diet. ? ?-Your final pathology results from surgery should be available around one week after surgery and the results will be relayed to you when available. ? ?-FMLA forms can be faxed to [phone] and please allow 5-7 business days for completion. ? ?Pain Management After Surgery ?-You will be prescribed your pain medication and bowel regimen medications before surgery so that  you can have these available when you are discharged from the hospital. The pain medication is for use ONLY AFTER surgery and a new prescription will not be given.  ? ?-Make sure that you have Tylenol and Ibuprofen IF YOU ARE ABLE TO TAKE THESE MEDICATIONS at home to use on a regular basis after surgery for pain control. We recommend alternating the medications every hour to six hours since they work differently and are processed in the body differently for pain relief. ? ?-Review the attached handout on narcotic use and their risks and side effects.  ? ?Bowel Regimen ?-You will be prescribed Sennakot-S to take nightly to prevent constipation especially if you are taking the narcotic pain medication intermittently.  It is important to prevent constipation and drink adequate amounts of liquids. You can stop taking this medication when you are not taking pain medication and you are back on your normal bowel routine. ? ?Risks of Surgery ?Risks of surgery are low but include bleeding, infection, damage to surrounding structures, re-operation, blood clots, and very rarely death. ? ? ?Blood Transfusion Information (For the consent to be signed before surgery) ? ?We will be checking your blood type before surgery so in case of emergencies, we will know what type of blood you would need. ? ?                                          WHAT IS A BLOOD TRANSFUSION? ? ?A transfusion is the replacement of blood or some of its parts. Blood is made up of multiple cells which provide different functions. ?Red blood cells carry oxygen and are used for blood loss replacement. ?White blood cells fight against infection. ?Platelets control bleeding. ?Plasma helps clot blood. ?Other blood products are available for specialized needs, such as hemophilia or other clotting disorders. ?BEFORE THE TRANSFUSION  ?Who gives blood for transfusions?  ?You may be able to donate blood to be used at a later date on yourself (autologous  donation). ?Relatives can be asked to donate blood. This is generally not any safer than if you have received blood from a stranger. The same precautions are taken to ensure safety when a relative's blood is donated. ?Healthy volunteers who are fully evaluated to make sure their blood is safe. This is blood bank blood. ?Transfusion therapy is the safest it has ever been in the practice of medicine. Before blood is taken from a donor, a complete history is taken to make sure that person has no history of diseases nor engages in risky social behavior (examples are intravenous drug use or sexual activity with multiple partners). The donor's travel history is screened to minimize risk of transmitting infections, such as malaria. The donated blood is tested for signs of infectious diseases, such as HIV and hepatitis. The blood is then tested to be sure it is compatible with you in order to minimize the chance of a transfusion reaction. If you  or a relative donates blood, this is often done in anticipation of surgery and is not appropriate for emergency situations. It takes many days to process the donated blood. ?RISKS AND COMPLICATIONS ?Although transfusion therapy is very safe and saves many lives, the main dangers of transfusion include:  ?Getting an infectious disease. ?Developing a transfusion reaction. This is an allergic reaction to something in the blood you were given. Every precaution is taken to prevent this. ?The decision to have a blood transfusion has been considered carefully by your caregiver before blood is given. Blood is not given unless the benefits outweigh the risks. ? ?AFTER SURGERY INSTRUCTIONS ? ?Return to work: 4-6 weeks if applicable ? ?Activity: ?1. Be up and out of the bed during the day.  Take a nap if needed.  You may walk up steps but be careful and use the hand rail.  Stair climbing will tire you more than you think, you may need to stop part way and rest.  ? ?2. No lifting or straining  for 6 weeks over 10 pounds. No pushing, pulling, straining for 6 weeks. ? ?3. No driving for around 1 week(s).  Do not drive if you are taking narcotic pain medicine and make sure that your reaction time has returned.

## 2021-10-03 NOTE — Patient Instructions (Signed)
Dr. Berline Lopes will contact you with the results of your endometrial biopsy from today. If no cancer identified, surgery will be post-poned. If cancer present, proceed as scheduled with surgery. ?  ?Preparing for your Surgery ?  ?Plan for surgery on October 21, 2021 with Dr. Jeral Pinch at [place] will be scheduled for robotic assisted total laparoscopic hysterectomy (removal of the uterus and cervix), bilateral salpingo-oophorectomy (removal of both ovaries and fallopian tubes), sentinel lymph node biopsy, possible lymph node dissection, possible laparotomy (larger incision on your abdomen if needed).  ?  ?Pre-operative Testing ?-(DONE, scheduled 4/12) You will receive a phone call from presurgical testing at [facility] to arrange for a pre-operative appointment and lab work. ?  ?-Bring your insurance card, copy of an advanced directive if applicable, medication list ?  ?-At that visit, you will be asked to sign a consent for a possible blood transfusion in case a transfusion becomes necessary during surgery.  The need for a blood transfusion is rare but having consent is a necessary part of your care.    ?  ?-You should not be taking blood thinners or aspirin at least ten days prior to surgery unless instructed by your surgeon. ?  ?-Do not take supplements such as fish oil (omega 3), red yeast rice, turmeric before your surgery. You want to avoid medications with aspirin in them including headache powders such as BC or Goody's), Excedrin migraine. ?  ?Day Before Surgery at Home ?-You will be asked to take in a light diet the day before surgery. You will be advised you can have clear liquids up until 3 hours before your surgery.   ?  ?Eat a light diet the day before surgery.  Examples including soups, broths, toast, yogurt, mashed potatoes.  AVOID GAS PRODUCING FOODS. Things to avoid include carbonated beverages (fizzy beverages, sodas), raw fruits and raw vegetables (uncooked), or beans.   ?  ?If your bowels are filled with gas, your surgeon will have difficulty visualizing your pelvic organs which increases your surgical risks. ?  ?Your role in recovery ?Your role is to become active as soon as directed by your doctor, while still giving yourself time to heal.  Rest when you feel tired. You will be asked to do the following in order to speed your recovery: ?  ?- Cough and breathe deeply. This helps to clear and expand your lungs and can prevent pneumonia after surgery.  ?- STAY ACTIVE WHEN YOU GET HOME. Do mild physical activity. Walking or moving your legs help your circulation and body functions return to normal. Do not try to get up or walk alone the first time after surgery.   ?-If you develop swelling on one leg or the other, pain in the back of your leg, redness/warmth in one of your legs, please call the office or go to the Emergency Room to have a doppler to rule out a blood clot. For shortness of breath, chest pain-seek care in the Emergency Room as soon as possible. ?- Actively manage your pain. Managing your pain lets you move in comfort. We will ask you to rate your pain on a scale of zero to 10. It is your responsibility to tell your doctor or nurse where and how much you hurt so your pain can be treated. ?  ?Special Considerations ?-If you are diabetic, you may be placed on insulin after surgery to have closer control over your blood sugars to promote healing and recovery.  This does not mean that you will be discharged on insulin.  If applicable, your oral antidiabetics will be resumed when you are tolerating a solid diet. ?  ?-Your final pathology results from surgery should be available around one week after surgery and the results will be relayed to you when available. ?  ?-FMLA forms can be faxed to [phone] and please allow 5-7 business days for completion. ?  ?Pain Management After Surgery ?-You will be prescribed your pain medication and bowel regimen medications before  surgery so that you can have these available when you are discharged from the hospital. The pain medication is for use ONLY AFTER surgery and a new prescription will not be given.  ?  ?-Make sure that you have Tylenol and Ibuprofen IF YOU ARE ABLE TO TAKE THESE MEDICATIONS at home to use on a regular basis after surgery for pain control. We recommend alternating the medications every hour to six hours since they work differently and are processed in the body differently for pain relief. ?  ?-Review the attached handout on narcotic use and their risks and side effects.  ?  ?Bowel Regimen ?-You will be prescribed Sennakot-S to take nightly to prevent constipation especially if you are taking the narcotic pain medication intermittently.  It is important to prevent constipation and drink adequate amounts of liquids. You can stop taking this medication when you are not taking pain medication and you are back on your normal bowel routine. ?  ?Risks of Surgery ?Risks of surgery are low but include bleeding, infection, damage to surrounding structures, re-operation, blood clots, and very rarely death. ?  ?  ?Blood Transfusion Information (For the consent to be signed before surgery) ?  ?We will be checking your blood type before surgery so in case of emergencies, we will know what type of blood you would need. ?  ?                                          WHAT IS A BLOOD TRANSFUSION? ?  ?A transfusion is the replacement of blood or some of its parts. Blood is made up of multiple cells which provide different functions. ?Red blood cells carry oxygen and are used for blood loss replacement. ?White blood cells fight against infection. ?Platelets control bleeding. ?Plasma helps clot blood. ?Other blood products are available for specialized needs, such as hemophilia or other clotting disorders. ?BEFORE THE TRANSFUSION  ?Who gives blood for transfusions?  ?You may be able to donate blood to be used at a later date on yourself  (autologous donation). ?Relatives can be asked to donate blood. This is generally not any safer than if you have received blood from a stranger. The same precautions are taken to ensure safety when a relative's blood is donated. ?Healthy volunteers who are fully evaluated to make sure their blood is safe. This is blood bank blood. ?Transfusion therapy is the safest it has ever been in the practice of medicine. Before blood is taken from a donor, a complete history is taken to make sure that person has no history of diseases nor engages in risky social behavior (examples are intravenous drug use or sexual activity with multiple partners). The donor's travel history is screened to minimize risk of transmitting infections, such as malaria. The donated blood is tested for signs of infectious diseases, such as HIV and hepatitis. The  blood is then tested to be sure it is compatible with you in order to minimize the chance of a transfusion reaction. If you or a relative donates blood, this is often done in anticipation of surgery and is not appropriate for emergency situations. It takes many days to process the donated blood. ?RISKS AND COMPLICATIONS ?Although transfusion therapy is very safe and saves many lives, the main dangers of transfusion include:  ?Getting an infectious disease. ?Developing a transfusion reaction. This is an allergic reaction to something in the blood you were given. Every precaution is taken to prevent this. ?The decision to have a blood transfusion has been considered carefully by your caregiver before blood is given. Blood is not given unless the benefits outweigh the risks. ?  ?AFTER SURGERY INSTRUCTIONS ?  ?Return to work: 4-6 weeks if applicable ?  ?Activity: ?1. Be up and out of the bed during the day.  Take a nap if needed.  You may walk up steps but be careful and use the hand rail.  Stair climbing will tire you more than you think, you may need to stop part way and rest.  ?  ?2. No  lifting or straining for 6 weeks over 10 pounds. No pushing, pulling, straining for 6 weeks. ?  ?3. No driving for around 1 week(s).  Do not drive if you are taking narcotic pain medicine and make sure that you

## 2021-10-03 NOTE — Progress Notes (Signed)
Patient here for follow up with Dr. Jeral Pinch and for a pre-operative discussion prior to her scheduled surgery on October 21, 2021. She is scheduled for robotic assisted total laparoscopic hysterectomy, bilateral salpingo-oophorectomy, sentinel lymph node biopsy, possible lymph node dissection, possible laparotomy. The surgery was discussed in detail.  See after visit summary for additional details. Visual aids used to discuss items related to surgery including sequential compression stockings, foley catheter, IV pump, multi-modal pain regimen including tylenol, photo of the surgical robot, female reproductive system to discuss surgery in detail.    ?  ?Discussed post-op pain management in detail including the aspects of the enhanced recovery pathway.  Advised her that a new prescription would be sent in if surgery is not delayed and it is only to be used for after her upcoming surgery.  We discussed the use of tylenol post-op and to monitor for a maximum of 4,000 mg in a 24 hour period.  Will also prescribe sennakot to be used after surgery and to hold if having loose stools.  Discussed bowel regimen in detail.   ?  ?Discussed the use of SCDs and measures to take at home to prevent DVT including frequent mobility.  Reportable signs and symptoms of DVT discussed. Post-operative instructions discussed and expectations for after surgery. Incisional care discussed as well including reportable signs and symptoms including erythema, drainage, wound separation.  ?   ?5 minutes spent with the patient. Verbalizing understanding of material discussed. No needs or concerns voiced at the end of the visit. Advised patient to call for any needs. Dr. Berline Lopes will contact the patient with the results of her endometrial biopsy from today. If cancer is still present, plan to proceed with surgery as planned. If no cancer, surgery may be delayed. ? ?This appointment is included in the global surgical bundle as pre-operative  teaching and has no charge.     ?

## 2021-10-06 ENCOUNTER — Telehealth: Payer: Self-pay | Admitting: *Deleted

## 2021-10-06 LAB — SURGICAL PATHOLOGY

## 2021-10-06 NOTE — Telephone Encounter (Signed)
Spoke with pt this afternoon to inform her that per Joylene John, NP the biopsy shows low grade cancer still and they would like to move forward with the date planned for surgery on October 21, 2021.  We are also going to go ahead and send in her medications for after surgery.  We usually send in tramadol to be used for moderate to severe pain if needed.  If not needed, it does not have to be taken. We recommend alternating Tylenol and ibuprofen.  Pt stated she can take these mediations. We recommend using over the counter dosing she has at home (ibuprofen 200-4-- mg, Tylenol 325-500 mg as directed on the bottle or the Tylenol she has on her med list).  We will also send in sennakot-S to take every evening for the first 2-5 days after surgery to prevent straining and constipation.  If having loose stools do not take.  If she would prefer, she can use miralax she has at home and take daily or increase to twice daily if bowels are not moving. Pt verbalized understanding and did not voice any other concerns at this time.  ?

## 2021-10-14 ENCOUNTER — Encounter (HOSPITAL_COMMUNITY): Payer: Self-pay

## 2021-10-14 NOTE — Patient Instructions (Addendum)
DUE TO COVID-19 ONLY TWO VISITOR  (aged 69 and older)  IS ALLOWED TO COME WITH YOU AND STAY IN THE WAITING ROOM ONLY DURING PRE OP AND PROCEDURE.   ?**NO VISITORS ARE ALLOWED IN THE SHORT STAY AREA OR RECOVERY ROOM!!** ? ? ? Your procedure is scheduled on: 10-21-21 ? ? Report to [facility] Main Entrance ? ?  Report to admitting at     [place] AM ? ? Call this number if you have problems the morning of surgery [phone] ? ?Eat a light diet the day before surgery.  Examples including soups, broths, toast, yogurt, mashed potatoes.  Things to avoid include carbonated beverages (fizzy beverages), raw fruits and raw vegetables, or beans.  ? ?If your bowels are filled with gas, your surgeon will have difficulty visualizing your pelvic organs which increases your surgical risks.  ? ? Do not eat food :After Midnight. ? ? After Midnight you may have the following liquids until _0430_____ AM DAY OF SURGERY  Then nothing by mouth ? ?Water ?Black Coffee (sugar ok, NO MILK/CREAM OR CREAMERS)  ?Tea (sugar ok, NO MILK/CREAM OR CREAMERS) regular and decaf                             ?Plain Jell-O (NO RED)                                           ?Fruit ices (not with fruit pulp, NO RED)                                     ?Popsicles (NO RED)                                                                  ?Juice: apple, WHITE grape, WHITE cranberry ?Sports drinks like Gatorade (NO RED) ?Clear broth(vegetable,chicken,beef) ? ?               ? ?  ?  ?The day of surgery:  ?Drink ONE (1) G2 at      0430 AM the morning of surgery. Drink in one sitting. Do not sip.  ?This drink was given to you during your hospital  ?pre-op appointment visit. ?Nothing else to drink after completing the  ?Pre-Surgery  G2. ?  ?       If you have questions, please contact your surgeon?s office. ? ? ?FOLLOW ANY ADDITIONAL PRE OP INSTRUCTIONS YOU RECEIVED FROM YOUR SURGEON'S OFFICE!!! ?  ?  ?Oral Hygiene is also important to reduce your risk of  infection.                                    ?Remember - BRUSH YOUR TEETH THE MORNING OF SURGERY WITH YOUR REGULAR TOOTHPASTE ? ? Do NOT smoke after Midnight ? ? Take these medicines the morning of surgery with A SIP OF WATER: Atenolol, methocarbamol if needed ? ?                  ?  You may not have any metal on your body including hair pins, jewelry, and body piercing ? ?           Do not wear make-up, lotions, powders, perfumes/cologne, or deodorant ? ?Do not wear nail polish including gel and S&S, artificial/acrylic nails, or any other type of covering on natural nails including finger and toenails. If you have artificial nails, gel coating, etc. that needs to be removed by a nail salon please have this removed prior to surgery or surgery may need to be canceled/ delayed if the surgeon/ anesthesia feels like they are unable to be safely monitored.  ? ?Do not shave  48 hours prior to surgery.  ? ? ? Do not bring valuables to the hospital. [facility] NOT ?            RESPONSIBLE   FOR VALUABLES. ? ? Contacts, dentures or bridgework may not be worn into surgery. ? ? Bring small overnight bag day of surgery. ?  ? Patients discharged on the day of surgery will not be allowed to drive home.  Someone NEEDS to stay with you for the first 24 hours after anesthesia. ? ? Special Instructions: If you have one  Bring a copy of your healthcare power of attorney and living will documents         the day of surgery if you haven't scanned them before. ? ?            Please read over the following fact sheets you were given: IF [place] [phone] ? ?   [place] - Preparing for Surgery ?Before surgery, you can play an important role.  Because skin is not sterile, your skin needs to be as free of germs as possible.  You can reduce the number of germs on your skin by washing with CHG (chlorahexidine gluconate) soap before surgery.  CHG is an antiseptic cleaner  which kills germs and bonds with the skin to continue killing germs even after washing. ?Please DO NOT use if you have an allergy to CHG or antibacterial soaps.  If your skin becomes reddened/irritated stop using the CHG and inform your nurse when you arrive at Short Stay. ?Do not shave (including legs and underarms) for at least 48 hours prior to the first CHG shower.  You may shave your face/neck. ?Please follow these instructions carefully: ? 1.  Shower with CHG Soap the night before surgery and the  morning of Surgery. ? 2.  If you choose to wash your hair, wash your hair first as usual with your  normal  shampoo. ? 3.  After you shampoo, rinse your hair and body thoroughly to remove the  shampoo.                           4.  Use CHG as you would any other liquid soap.  You can apply chg directly  to the skin and wash  ?                     Gently with a scrungie or clean washcloth. ? 5.  Apply the CHG Soap to your body ONLY FROM THE NECK DOWN.   Do not use on face/ open      ?                     Wound  or open sores. Avoid contact with eyes, ears mouth and genitals (private parts).  ?                     Production manager,  Genitals (private parts) with your normal soap. ?            6.  Wash thoroughly, paying special attention to the area where your surgery  will be performed. ? 7.  Thoroughly rinse your body with warm water from the neck down. ? 8.  DO NOT shower/wash with your normal soap after using and rinsing off  the CHG Soap. ?               9.  Pat yourself dry with a clean towel. ?           10.  Wear clean pajamas. ?           11.  Place clean sheets on your bed the night of your first shower and do not  sleep with pets. ?Day of Surgery : ?Do not apply any lotions/deodorants the morning of surgery.  Please wear clean clothes to the hospital/surgery center. ? ?FAILURE TO FOLLOW THESE INSTRUCTIONS MAY RESULT IN THE CANCELLATION OF YOUR SURGERY ?PATIENT SIGNATURE_________________________________ ? ?NURSE  SIGNATURE__________________________________ ? ?________________________________________________________________________  ?WHAT IS A BLOOD TRANSFUSION? Blood Transfusion Information ? ?A transfusion is the replacement of blood or some of its parts. Blood is made up of multiple cells which provide different functions. ?Red blood cells carry oxygen and are used for blood loss replacement. ?White blood cells fight against infection. ?Platelets control bleeding. ?Plasma helps clot blood. ?Other blood products are available for specialized needs, such as hemophilia or other clotting disorders. ?BEFORE THE TRANSFUSION  ?Who gives blood for transfusions?  ?Healthy volunteers who are fully evaluated to make sure their blood is safe. This is blood bank blood. ?Transfusion therapy is the safest it has ever been in the practice of medicine. Before blood is taken from a donor, a complete history is taken to make sure that person has no history of diseases nor engages in risky social behavior (examples are intravenous drug use or sexual activity with multiple partners). The donor's travel history is screened to minimize risk of transmitting infections, such as malaria. The donated blood is tested for signs of infectious diseases, such as HIV and hepatitis. The blood is then tested to be sure it is compatible with you in order to minimize the chance of a transfusion reaction. If you or a relative donates blood, this is often done in anticipation of surgery and is not appropriate for emergency situations. It takes many days to process the donated blood. ?RISKS AND COMPLICATIONS ?Although transfusion therapy is very safe and saves many lives, the main dangers of transfusion include:  ?Getting an infectious disease. ?Developing a transfusion reaction. This is an allergic reaction to something in the blood you were given. Every precaution is taken to prevent this. ?The decision to have a blood transfusion has been considered carefully  by your caregiver before blood is given. Blood is not given unless the benefits outweigh the risks. ?AFTER THE TRANSFUSION ?Right after receiving a blood transfusion, you will usually feel much better and mor

## 2021-10-15 ENCOUNTER — Encounter (HOSPITAL_COMMUNITY): Payer: Self-pay

## 2021-10-15 ENCOUNTER — Encounter (HOSPITAL_COMMUNITY)
Admission: RE | Admit: 2021-10-15 | Discharge: 2021-10-15 | Disposition: A | Discharge status: Home / Self Care | Payer: Medicare Other | Source: Ambulatory Visit | Attending: Gynecologic Oncology | Admitting: Gynecologic Oncology

## 2021-10-15 VITALS — BP 142/61 | HR 74 | Temp 98.3°F | Resp 14 | Ht 65.0 in | Wt 242.0 lb

## 2021-10-15 DIAGNOSIS — Z01812 Encounter for preprocedural laboratory examination: Secondary | ICD-10-CM | POA: Insufficient documentation

## 2021-10-15 DIAGNOSIS — R7303 Prediabetes: Secondary | ICD-10-CM | POA: Diagnosis not present

## 2021-10-15 DIAGNOSIS — C541 Malignant neoplasm of endometrium: Secondary | ICD-10-CM | POA: Insufficient documentation

## 2021-10-15 HISTORY — DX: Headache, unspecified: R51.9

## 2021-10-15 LAB — CBC
HCT: 47 % — ABNORMAL HIGH (ref 36.0–46.0)
Hemoglobin: 15.5 g/dL — ABNORMAL HIGH (ref 12.0–15.0)
MCH: 31.3 pg (ref 26.0–34.0)
MCHC: 33 g/dL (ref 30.0–36.0)
MCV: 94.8 fL (ref 80.0–100.0)
Platelets: 247 10*3/uL (ref 150–400)
RBC: 4.96 MIL/uL (ref 3.87–5.11)
RDW: 12.4 % (ref 11.5–15.5)
WBC: 9.2 10*3/uL (ref 4.0–10.5)
nRBC: 0 % (ref 0.0–0.2)

## 2021-10-15 LAB — COMPREHENSIVE METABOLIC PANEL
ALT: 18 U/L (ref 0–44)
AST: 15 U/L (ref 15–41)
Albumin: 4.1 g/dL (ref 3.5–5.0)
Alkaline Phosphatase: 92 U/L (ref 38–126)
Anion gap: 7 (ref 5–15)
BUN: 17 mg/dL (ref 8–23)
CO2: 27 mmol/L (ref 22–32)
Calcium: 10.5 mg/dL — ABNORMAL HIGH (ref 8.9–10.3)
Chloride: 105 mmol/L (ref 98–111)
Creatinine, Ser: 0.66 mg/dL (ref 0.44–1.00)
GFR, Estimated: >60 mL/min (ref >60)
Glucose, Bld: 106 mg/dL — ABNORMAL HIGH (ref 70–99)
Potassium: 4.8 mmol/L (ref 3.5–5.1)
Sodium: 139 mmol/L (ref 135–145)
Total Bilirubin: 0.9 mg/dL (ref 0.3–1.2)
Total Protein: 7.4 g/dL (ref 6.5–8.1)

## 2021-10-15 LAB — GLUCOSE, CAPILLARY: Glucose-Capillary: 112 mg/dL — ABNORMAL HIGH (ref 70–99)

## 2021-10-15 LAB — TYPE AND SCREEN
ABO/RH(D): O NEG
Antibody Screen: NEGATIVE

## 2021-10-15 LAB — HEMOGLOBIN A1C
Hgb A1c MFr Bld: 5.3 % (ref 4.8–5.6)
Mean Plasma Glucose: 105.41 mg/dL

## 2021-10-15 NOTE — Progress Notes (Signed)
COVID Vaccine Completed: yes x3 ?Date COVID Vaccine completed: ?Has received booster: ?COVID vaccine manufacturer: [place]  ? ?Date of COVID positive in last 90 days: no ? ?PCP - Asencion Noble ?Cardiologist - n/a ? ?Chest x-ray - n/a ?EKG - 12/17/20 Epic ?Stress Test - n/a ?ECHO - 2013 Epic ?Cardiac Cath - n/a ?Pacemaker/ICD device last checked: n/a ?Spinal Cord Stimulator: n/a ? ?Bowel Prep - light diet day before surgery ? ?Sleep Study - yes, failed test ?CPAP -  ? ?Fasting Blood Sugar - pre DM, no meds or checks at home ?Checks Blood Sugar _____ times a day ? ?Blood Thinner Instructions: n/a ?Aspirin Instructions: ?Last Dose: ? ?Activity level: Can go up a flight of stairs and perform activities of daily living without stopping and without symptoms of chest pain or shortness of breath. ?   ?Anesthesia review: HTN, PVD, a fib 2013, pre DM ? ?Patient denies shortness of breath, fever, cough and chest pain at PAT appointment ? ? ?Patient verbalized understanding of instructions that were given to them at the PAT appointment. Patient was also instructed that they will need to review over the PAT instructions again at home before surgery.  ?

## 2021-10-16 ENCOUNTER — Telehealth: Payer: Self-pay

## 2021-10-16 NOTE — Telephone Encounter (Signed)
Spoke with Patty Estrada this morning and reviewed recent CMP results. Per NP, Calcium slightly elevated (10.5) ensure patient is not taking calcium.  ?Patient reports "The only calcium I take is in my multivitamin but I will stop taking it." Advised that a copy of her lab results will be sent to her PCP Dr. Willey Blade. Patient verbalized understanding and advised to call with any needs.  ?

## 2021-10-20 ENCOUNTER — Telehealth: Payer: Self-pay | Admitting: *Deleted

## 2021-10-20 NOTE — Telephone Encounter (Signed)
Telephone call to check on pre-operative status.  Patient compliant with pre-operative instructions.  Reinforced nothing to eat after midnight. Clear liquids until 0430. Patient to arrive at [place].  No questions or concerns voiced.  Instructed to call for any needs.  ?

## 2021-10-20 NOTE — Anesthesia Preprocedure Evaluation (Addendum)
Anesthesia Evaluation  ?Patient identified by MRN, date of birth, ID band ?Patient awake ? ? ? ?Reviewed: ?Allergy & Precautions, H&P , NPO status , Patient's Chart, lab work & pertinent test results, reviewed documented beta blocker date and time  ? ?History of Anesthesia Complications ?Negative for: history of anesthetic complications ? ?Airway ?Mallampati: III ? ?TM Distance: >3 FB ?Neck ROM: Full ? ? ? Dental ?no notable dental hx. ?(+) Dental Advisory Given ?  ?Pulmonary ?neg pulmonary ROS, former smoker,  ?  ?Pulmonary exam normal ? ? ? ? ? ? ? Cardiovascular ?Exercise Tolerance: Good ?hypertension, Pt. on medications and Pt. on home beta blockers ?Normal cardiovascular exam+ dysrhythmias Atrial Fibrillation  ?Rhythm:Regular Rate:Normal ? ? ?  ?Neuro/Psych ?PSYCHIATRIC DISORDERS Depression negative neurological ROS ?   ? GI/Hepatic ?Neg liver ROS, GERD  ,  ?Endo/Other  ?Morbid obesity ? Renal/GU ?negative Renal ROS  ?negative genitourinary ?  ?Musculoskeletal ? ?(+) Arthritis , Osteoarthritis,   ? Abdominal ?  ?Peds ? Hematology ?negative hematology ROS ?(+)   ?Anesthesia Other Findings ? ? Reproductive/Obstetrics ?negative OB ROS ? ?  ? ? ? ? ? ? ? ? ? ? ? ? ? ?  ?  ? ? ? ? ? ? ? ?Anesthesia Physical ? ?Anesthesia Plan ? ?ASA: 3 ? ?Anesthesia Plan: General  ? ?Post-op Pain Management: Tylenol PO (pre-op)* and Celebrex PO (pre-op)*  ? ?Induction: Intravenous ? ?PONV Risk Score and Plan: 4 or greater and Ondansetron, Dexamethasone, Midazolam and Scopolamine patch - Pre-op ? ?Airway Management Planned: Oral ETT ? ?Additional Equipment: Arterial line ? ?Intra-op Plan:  ? ?Post-operative Plan: Extubation in OR ? ?Informed Consent: I have reviewed the patients History and Physical, chart, labs and discussed the procedure including the risks, benefits and alternatives for the proposed anesthesia with the patient or authorized representative who has indicated his/her understanding  and acceptance.  ? ? ? ?Dental advisory given ? ?Plan Discussed with: Anesthesiologist and CRNA ? ?Anesthesia Plan Comments: (Consider [place])  ? ? ? ? ? ?Anesthesia Quick Evaluation ? ?

## 2021-10-21 ENCOUNTER — Other Ambulatory Visit: Payer: Self-pay

## 2021-10-21 ENCOUNTER — Encounter (HOSPITAL_COMMUNITY)
Admission: RE | Disposition: A | Discharge status: Home / Self Care | Payer: Self-pay | Source: Home / Self Care | Attending: Gynecologic Oncology

## 2021-10-21 ENCOUNTER — Ambulatory Visit (HOSPITAL_COMMUNITY)
Admission: RE | Admit: 2021-10-21 | Discharge: 2021-10-22 | Disposition: A | Discharge status: Home / Self Care | Payer: Medicare Other | Attending: Gynecologic Oncology | Admitting: Gynecologic Oncology

## 2021-10-21 ENCOUNTER — Ambulatory Visit (HOSPITAL_BASED_OUTPATIENT_CLINIC_OR_DEPARTMENT_OTHER): Payer: Medicare Other | Admitting: Anesthesiology

## 2021-10-21 ENCOUNTER — Encounter (HOSPITAL_COMMUNITY): Payer: Self-pay | Admitting: Gynecologic Oncology

## 2021-10-21 ENCOUNTER — Ambulatory Visit (HOSPITAL_COMMUNITY): Payer: Medicare Other | Admitting: Emergency Medicine

## 2021-10-21 DIAGNOSIS — I10 Essential (primary) hypertension: Secondary | ICD-10-CM | POA: Diagnosis not present

## 2021-10-21 DIAGNOSIS — Z87891 Personal history of nicotine dependence: Secondary | ICD-10-CM | POA: Diagnosis not present

## 2021-10-21 DIAGNOSIS — R7303 Prediabetes: Secondary | ICD-10-CM | POA: Insufficient documentation

## 2021-10-21 DIAGNOSIS — K219 Gastro-esophageal reflux disease without esophagitis: Secondary | ICD-10-CM | POA: Diagnosis not present

## 2021-10-21 DIAGNOSIS — C541 Malignant neoplasm of endometrium: Secondary | ICD-10-CM | POA: Diagnosis present

## 2021-10-21 DIAGNOSIS — M1990 Unspecified osteoarthritis, unspecified site: Secondary | ICD-10-CM | POA: Insufficient documentation

## 2021-10-21 DIAGNOSIS — F32A Depression, unspecified: Secondary | ICD-10-CM | POA: Diagnosis not present

## 2021-10-21 DIAGNOSIS — Z6841 Body Mass Index (BMI) 40.0 and over, adult: Secondary | ICD-10-CM

## 2021-10-21 DIAGNOSIS — N8003 Adenomyosis of the uterus: Secondary | ICD-10-CM | POA: Diagnosis not present

## 2021-10-21 DIAGNOSIS — I4891 Unspecified atrial fibrillation: Secondary | ICD-10-CM | POA: Insufficient documentation

## 2021-10-21 DIAGNOSIS — D259 Leiomyoma of uterus, unspecified: Secondary | ICD-10-CM | POA: Diagnosis not present

## 2021-10-21 HISTORY — PX: ROBOTIC ASSISTED TOTAL HYSTERECTOMY WITH BILATERAL SALPINGO OOPHERECTOMY: SHX6086

## 2021-10-21 HISTORY — PX: SENTINEL NODE BIOPSY: SHX6608

## 2021-10-21 LAB — GLUCOSE, CAPILLARY: Glucose-Capillary: 131 mg/dL — ABNORMAL HIGH (ref 70–99)

## 2021-10-21 SURGERY — HYSTERECTOMY, TOTAL, ROBOT-ASSISTED, LAPAROSCOPIC, WITH BILATERAL SALPINGO-OOPHORECTOMY
Anesthesia: General

## 2021-10-21 MED ORDER — HYDROMORPHONE HCL 1 MG/ML IJ SOLN: 0.2000 mg | INTRAMUSCULAR | Status: DC | PRN

## 2021-10-21 MED ORDER — ALBUMIN HUMAN 5 % IV SOLN: INTRAVENOUS | Status: DC | PRN

## 2021-10-21 MED ORDER — LACTATED RINGERS IV SOLN: INTRAVENOUS | Status: DC | PRN

## 2021-10-21 MED ORDER — DEXAMETHASONE SODIUM PHOSPHATE 4 MG/ML IJ SOLN
4.0000 mg | INTRAMUSCULAR | Status: AC
  Administered 2021-10-21: 4 mg via INTRAVENOUS

## 2021-10-21 MED ORDER — ACETAMINOPHEN 500 MG PO TABS: 1000.0000 mg | ORAL_TABLET | Freq: Once | ORAL | Status: DC

## 2021-10-21 MED ORDER — ONDANSETRON HCL 4 MG/2ML IJ SOLN
INTRAMUSCULAR | Status: DC | PRN
  Administered 2021-10-21: 4 mg via INTRAVENOUS

## 2021-10-21 MED ORDER — CELECOXIB 200 MG PO CAPS
200.0000 mg | ORAL_CAPSULE | Freq: Once | ORAL | Status: AC
  Administered 2021-10-21: 200 mg via ORAL
  Filled 2021-10-21: qty 1

## 2021-10-21 MED ORDER — AMISULPRIDE (ANTIEMETIC) 5 MG/2ML IV SOLN
10.0000 mg | Freq: Once | INTRAVENOUS | Status: AC | PRN
  Administered 2021-10-21: 10 mg via INTRAVENOUS

## 2021-10-21 MED ORDER — FENTANYL CITRATE PF 50 MCG/ML IJ SOSY: 25.0000 ug | PREFILLED_SYRINGE | INTRAMUSCULAR | Status: DC | PRN

## 2021-10-21 MED ORDER — DEXMEDETOMIDINE (PRECEDEX) IN NS 20 MCG/5ML (4 MCG/ML) IV SYRINGE
PREFILLED_SYRINGE | INTRAVENOUS | Status: DC | PRN
  Administered 2021-10-21: 8 ug via INTRAVENOUS
  Administered 2021-10-21: 12 ug via INTRAVENOUS

## 2021-10-21 MED ORDER — ONDANSETRON HCL 4 MG PO TABS: 4.0000 mg | ORAL_TABLET | Freq: Four times a day (QID) | ORAL | Status: DC | PRN

## 2021-10-21 MED ORDER — BUPIVACAINE HCL 0.25 % IJ SOLN
INTRAMUSCULAR | Status: AC
  Filled 2021-10-21: qty 1

## 2021-10-21 MED ORDER — HEPARIN SODIUM (PORCINE) 5000 UNIT/ML IJ SOLN
5000.0000 [IU] | INTRAMUSCULAR | Status: AC
  Administered 2021-10-21: 5000 [IU] via SUBCUTANEOUS
  Filled 2021-10-21: qty 1

## 2021-10-21 MED ORDER — ALBUMIN HUMAN 5 % IV SOLN
INTRAVENOUS | Status: AC
  Filled 2021-10-21: qty 250

## 2021-10-21 MED ORDER — FENTANYL CITRATE PF 50 MCG/ML IJ SOSY
PREFILLED_SYRINGE | INTRAMUSCULAR | Status: DC
Start: 2021-10-21 — End: 2021-10-21
  Filled 2021-10-21: qty 2

## 2021-10-21 MED ORDER — ENOXAPARIN SODIUM 40 MG/0.4ML IJ SOSY
40.0000 mg | PREFILLED_SYRINGE | INTRAMUSCULAR | Status: DC
  Administered 2021-10-22: 40 mg via SUBCUTANEOUS
  Filled 2021-10-21: qty 0.4

## 2021-10-21 MED ORDER — ATENOLOL 50 MG PO TABS: 50.0000 mg | ORAL_TABLET | Freq: Every day | ORAL | Status: DC

## 2021-10-21 MED ORDER — ORAL CARE MOUTH RINSE: 15.0000 mL | Freq: Once | OROMUCOSAL | Status: AC

## 2021-10-21 MED ORDER — STERILE WATER FOR INJECTION IJ SOLN
INTRAMUSCULAR | Status: DC | PRN
  Administered 2021-10-21: 4 mL via INTRAMUSCULAR

## 2021-10-21 MED ORDER — PHENYLEPHRINE HCL-NACL 20-0.9 MG/250ML-% IV SOLN
INTRAVENOUS | Status: DC | PRN
  Administered 2021-10-21: 30 ug/min via INTRAVENOUS

## 2021-10-21 MED ORDER — BUPIVACAINE HCL 0.25 % IJ SOLN
INTRAMUSCULAR | Status: DC | PRN
  Administered 2021-10-21: 30 mL

## 2021-10-21 MED ORDER — SUGAMMADEX SODIUM 200 MG/2ML IV SOLN
INTRAVENOUS | Status: DC | PRN
  Administered 2021-10-21: 250 mg via INTRAVENOUS

## 2021-10-21 MED ORDER — CEFAZOLIN SODIUM-DEXTROSE 2-4 GM/100ML-% IV SOLN
2.0000 g | INTRAVENOUS | Status: AC
  Administered 2021-10-21: 2 g via INTRAVENOUS
  Filled 2021-10-21: qty 100

## 2021-10-21 MED ORDER — LACTATED RINGERS IV SOLN: INTRAVENOUS | Status: DC

## 2021-10-21 MED ORDER — TRAMADOL HCL 50 MG PO TABS: 50.0000 mg | ORAL_TABLET | Freq: Four times a day (QID) | ORAL | Status: DC | PRN

## 2021-10-21 MED ORDER — PROPOFOL 10 MG/ML IV BOLUS
INTRAVENOUS | Status: DC | PRN
  Administered 2021-10-21: 130 mg via INTRAVENOUS

## 2021-10-21 MED ORDER — ROCURONIUM BROMIDE 100 MG/10ML IV SOLN
INTRAVENOUS | Status: DC | PRN
  Administered 2021-10-21: 100 mg via INTRAVENOUS
  Administered 2021-10-21 (×2): 20 mg via INTRAVENOUS

## 2021-10-21 MED ORDER — MIRTAZAPINE 15 MG PO TABS
15.0000 mg | ORAL_TABLET | Freq: Every day | ORAL | Status: DC
  Administered 2021-10-21: 15 mg via ORAL
  Filled 2021-10-21: qty 1

## 2021-10-21 MED ORDER — FENTANYL CITRATE (PF) 250 MCG/5ML IJ SOLN
INTRAMUSCULAR | Status: AC
  Filled 2021-10-21: qty 5

## 2021-10-21 MED ORDER — AMISULPRIDE (ANTIEMETIC) 5 MG/2ML IV SOLN
INTRAVENOUS | Status: AC
  Filled 2021-10-21: qty 4

## 2021-10-21 MED ORDER — ACETAMINOPHEN 325 MG PO TABS: 650.0000 mg | ORAL_TABLET | Freq: Four times a day (QID) | ORAL | Status: DC | PRN

## 2021-10-21 MED ORDER — MIDAZOLAM HCL 5 MG/5ML IJ SOLN
INTRAMUSCULAR | Status: DC | PRN
  Administered 2021-10-21 (×2): 1 mg via INTRAVENOUS

## 2021-10-21 MED ORDER — GLYCOPYRROLATE 0.2 MG/ML IJ SOLN
INTRAMUSCULAR | Status: DC | PRN
  Administered 2021-10-21: .2 mg via INTRAVENOUS

## 2021-10-21 MED ORDER — STERILE WATER FOR IRRIGATION IR SOLN
Status: DC | PRN
Start: 2021-10-21 — End: 2021-10-21
  Administered 2021-10-21: 1000 mL

## 2021-10-21 MED ORDER — BUPIVACAINE LIPOSOME 1.3 % IJ SUSP
INTRAMUSCULAR | Status: AC
  Filled 2021-10-21: qty 20

## 2021-10-21 MED ORDER — LIDOCAINE HCL (CARDIAC) PF 100 MG/5ML IV SOSY
PREFILLED_SYRINGE | INTRAVENOUS | Status: DC | PRN
  Administered 2021-10-21: 60 mg via INTRAVENOUS
  Administered 2021-10-21: 40 mg via INTRAVENOUS

## 2021-10-21 MED ORDER — LACTATED RINGERS IR SOLN
Status: DC | PRN
  Administered 2021-10-21: 1000 mL

## 2021-10-21 MED ORDER — ONDANSETRON HCL 4 MG/2ML IJ SOLN: 4.0000 mg | Freq: Four times a day (QID) | INTRAMUSCULAR | Status: DC | PRN

## 2021-10-21 MED ORDER — OXYCODONE HCL 5 MG PO TABS: 5.0000 mg | ORAL_TABLET | ORAL | Status: DC | PRN

## 2021-10-21 MED ORDER — ONDANSETRON HCL 4 MG/2ML IJ SOLN
INTRAMUSCULAR | Status: AC
  Filled 2021-10-21: qty 2

## 2021-10-21 MED ORDER — SUGAMMADEX SODIUM 500 MG/5ML IV SOLN
INTRAVENOUS | Status: AC
  Filled 2021-10-21: qty 5

## 2021-10-21 MED ORDER — HYDROMORPHONE HCL 2 MG/ML IJ SOLN
INTRAMUSCULAR | Status: AC
  Filled 2021-10-21: qty 1

## 2021-10-21 MED ORDER — PHENYLEPHRINE HCL-NACL 20-0.9 MG/250ML-% IV SOLN
INTRAVENOUS | Status: AC
  Filled 2021-10-21: qty 250

## 2021-10-21 MED ORDER — MIDAZOLAM HCL 2 MG/2ML IJ SOLN
INTRAMUSCULAR | Status: AC
  Filled 2021-10-21: qty 2

## 2021-10-21 MED ORDER — BUPIVACAINE LIPOSOME 1.3 % IJ SUSP
INTRAMUSCULAR | Status: DC | PRN
  Administered 2021-10-21: 20 mL

## 2021-10-21 MED ORDER — KCL IN DEXTROSE-NACL 10-5-0.45 MEQ/L-%-% IV SOLN
INTRAVENOUS | Status: DC
  Filled 2021-10-21 (×2): qty 1000

## 2021-10-21 MED ORDER — DEXAMETHASONE SODIUM PHOSPHATE 10 MG/ML IJ SOLN
INTRAMUSCULAR | Status: AC
  Filled 2021-10-21: qty 1

## 2021-10-21 MED ORDER — HYDROMORPHONE HCL 1 MG/ML IJ SOLN
INTRAMUSCULAR | Status: DC | PRN
  Administered 2021-10-21 (×4): .5 mg via INTRAVENOUS

## 2021-10-21 MED ORDER — SCOPOLAMINE 1 MG/3DAYS TD PT72
1.0000 | MEDICATED_PATCH | TRANSDERMAL | Status: DC
  Administered 2021-10-21: 1.5 mg via TRANSDERMAL
  Filled 2021-10-21: qty 1

## 2021-10-21 MED ORDER — ROCURONIUM BROMIDE 10 MG/ML (PF) SYRINGE
PREFILLED_SYRINGE | INTRAVENOUS | Status: AC
  Filled 2021-10-21: qty 10

## 2021-10-21 MED ORDER — LIDOCAINE HCL (PF) 2 % IJ SOLN
INTRAMUSCULAR | Status: AC
  Filled 2021-10-21: qty 5

## 2021-10-21 MED ORDER — POLYETHYLENE GLYCOL 3350 17 G PO PACK
17.0000 g | PACK | Freq: Every day | ORAL | Status: DC
  Administered 2021-10-21: 17 g via ORAL
  Filled 2021-10-21: qty 1

## 2021-10-21 MED ORDER — STERILE WATER FOR INJECTION IJ SOLN
INTRAMUSCULAR | Status: DC | PRN
  Administered 2021-10-21: 10 mL

## 2021-10-21 MED ORDER — STERILE WATER FOR INJECTION IJ SOLN
INTRAMUSCULAR | Status: AC
  Filled 2021-10-21: qty 10

## 2021-10-21 MED ORDER — METHOCARBAMOL 500 MG PO TABS: 500.0000 mg | ORAL_TABLET | Freq: Four times a day (QID) | ORAL | Status: DC | PRN

## 2021-10-21 MED ORDER — PROPOFOL 10 MG/ML IV BOLUS
INTRAVENOUS | Status: AC
  Filled 2021-10-21: qty 20

## 2021-10-21 MED ORDER — ACETAMINOPHEN 500 MG PO TABS
1000.0000 mg | ORAL_TABLET | ORAL | Status: AC
  Administered 2021-10-21: 1000 mg via ORAL
  Filled 2021-10-21: qty 2

## 2021-10-21 MED ORDER — ALUM & MAG HYDROXIDE-SIMETH 200-200-20 MG/5ML PO SUSP: 30.0000 mL | ORAL | Status: DC | PRN

## 2021-10-21 MED ORDER — FENTANYL CITRATE (PF) 100 MCG/2ML IJ SOLN
INTRAMUSCULAR | Status: DC | PRN
  Administered 2021-10-21 (×3): 50 ug via INTRAVENOUS
  Administered 2021-10-21: 100 ug via INTRAVENOUS

## 2021-10-21 MED ORDER — CHLORHEXIDINE GLUCONATE 0.12 % MT SOLN
15.0000 mL | Freq: Once | OROMUCOSAL | Status: AC
  Administered 2021-10-21: 15 mL via OROMUCOSAL

## 2021-10-21 SURGICAL SUPPLY — 82 items
APPLICATOR ARISTA FLEXITIP XL (MISCELLANEOUS) ×1 IMPLANT
APPLICATOR SURGIFLO ENDO (HEMOSTASIS) IMPLANT
BACTOSHIELD CHG 4% 4OZ (MISCELLANEOUS) ×1
BAG LAPAROSCOPIC 12 15 PORT 16 (BASKET) IMPLANT
BAG RETRIEVAL 12/15 (BASKET)
BLADE SURG SZ10 CARB STEEL (BLADE) ×1 IMPLANT
COVER BACK TABLE 60X90IN (DRAPES) ×3 IMPLANT
COVER TIP SHEARS 8 DVNC (MISCELLANEOUS) ×2 IMPLANT
COVER TIP SHEARS 8MM DA VINCI (MISCELLANEOUS) ×3
DERMABOND ADVANCED (GAUZE/BANDAGES/DRESSINGS) ×2
DERMABOND ADVANCED .7 DNX12 (GAUZE/BANDAGES/DRESSINGS) ×2 IMPLANT
DRAPE ARM DVNC X/XI (DISPOSABLE) ×8 IMPLANT
DRAPE COLUMN DVNC XI (DISPOSABLE) ×2 IMPLANT
DRAPE DA VINCI XI ARM (DISPOSABLE) ×12
DRAPE DA VINCI XI COLUMN (DISPOSABLE) ×3
DRAPE SHEET LG 3/4 BI-LAMINATE (DRAPES) ×3 IMPLANT
DRAPE SURG IRRIG POUCH 19X23 (DRAPES) ×3 IMPLANT
DRSG OPSITE POSTOP 4X6 (GAUZE/BANDAGES/DRESSINGS) ×1 IMPLANT
DRSG OPSITE POSTOP 4X8 (GAUZE/BANDAGES/DRESSINGS) IMPLANT
ELECT PENCIL ROCKER SW 15FT (MISCELLANEOUS) ×1 IMPLANT
ELECT REM PT RETURN 15FT ADLT (MISCELLANEOUS) ×3 IMPLANT
GAUZE 4X4 16PLY ~~LOC~~+RFID DBL (SPONGE) ×4 IMPLANT
GLOVE BIO SURGEON STRL SZ 6 (GLOVE) ×12 IMPLANT
GLOVE BIO SURGEON STRL SZ 6.5 (GLOVE) ×7 IMPLANT
GLOVE BIOGEL PI IND STRL 6.5 (GLOVE) IMPLANT
GLOVE BIOGEL PI IND STRL 7.0 (GLOVE) IMPLANT
GLOVE BIOGEL PI INDICATOR 6.5 (GLOVE) ×1
GLOVE BIOGEL PI INDICATOR 7.0 (GLOVE) ×1
GOWN STRL REUS W/ TWL LRG LVL3 (GOWN DISPOSABLE) ×8 IMPLANT
GOWN STRL REUS W/ TWL XL LVL3 (GOWN DISPOSABLE) IMPLANT
GOWN STRL REUS W/TWL LRG LVL3 (GOWN DISPOSABLE) ×12
GOWN STRL REUS W/TWL XL LVL3 (GOWN DISPOSABLE) ×6
HEMOSTAT ARISTA ABSORB 3G PWDR (HEMOSTASIS) ×1 IMPLANT
HOLDER FOLEY CATH W/STRAP (MISCELLANEOUS) IMPLANT
IRRIG SUCT STRYKERFLOW 2 WTIP (MISCELLANEOUS) ×3
IRRIGATION SUCT STRKRFLW 2 WTP (MISCELLANEOUS) ×2 IMPLANT
KIT PROCEDURE DA VINCI SI (MISCELLANEOUS) ×3
KIT PROCEDURE DVNC SI (MISCELLANEOUS) IMPLANT
KIT TURNOVER KIT A (KITS) IMPLANT
LIGASURE IMPACT 36 18CM CVD LR (INSTRUMENTS) IMPLANT
MANIPULATOR ADVINCU DEL 3.0 PL (MISCELLANEOUS) IMPLANT
MANIPULATOR ADVINCU DEL 3.5 PL (MISCELLANEOUS) ×1 IMPLANT
MANIPULATOR UTERINE 4.5 ZUMI (MISCELLANEOUS) IMPLANT
NDL HYPO 21X1.5 SAFETY (NEEDLE) ×2 IMPLANT
NDL SPNL 18GX3.5 QUINCKE PK (NEEDLE) IMPLANT
NEEDLE HYPO 21X1.5 SAFETY (NEEDLE) ×3 IMPLANT
NEEDLE SPNL 18GX3.5 QUINCKE PK (NEEDLE) ×3 IMPLANT
OBTURATOR OPTICAL STANDARD 8MM (TROCAR) ×3
OBTURATOR OPTICAL STND 8 DVNC (TROCAR) ×2
OBTURATOR OPTICALSTD 8 DVNC (TROCAR) ×2 IMPLANT
PACK ROBOT GYN CUSTOM WL (TRAY / TRAY PROCEDURE) ×3 IMPLANT
PAD POSITIONING PINK XL (MISCELLANEOUS) ×3 IMPLANT
PORT ACCESS TROCAR AIRSEAL 12 (TROCAR) ×2 IMPLANT
PORT ACCESS TROCAR AIRSEAL 5M (TROCAR) ×1
RETRACTOR LAPSCP 12X46 CVD (ENDOMECHANICALS) IMPLANT
RTRCTR LAPSCP 12X46 CVD (ENDOMECHANICALS) ×3
SCRUB CHG 4% DYNA-HEX 4OZ (MISCELLANEOUS) ×2 IMPLANT
SEAL CANN UNIV 5-8 DVNC XI (MISCELLANEOUS) ×8 IMPLANT
SEAL XI 5MM-8MM UNIVERSAL (MISCELLANEOUS) ×12
SET TRI-LUMEN FLTR TB AIRSEAL (TUBING) ×3 IMPLANT
SPIKE FLUID TRANSFER (MISCELLANEOUS) ×3 IMPLANT
SPONGE T-LAP 18X18 ~~LOC~~+RFID (SPONGE) ×1 IMPLANT
SURGIFLO W/THROMBIN 8M KIT (HEMOSTASIS) IMPLANT
SUT MNCRL AB 4-0 PS2 18 (SUTURE) IMPLANT
SUT PDS AB 1 TP1 96 (SUTURE) ×2 IMPLANT
SUT VIC AB 0 CT1 27 (SUTURE)
SUT VIC AB 0 CT1 27XBRD ANTBC (SUTURE) IMPLANT
SUT VIC AB 2-0 CT1 27 (SUTURE) ×3
SUT VIC AB 2-0 CT1 TAPERPNT 27 (SUTURE) IMPLANT
SUT VIC AB 2-0 SH 27 (SUTURE) ×3
SUT VIC AB 2-0 SH 27X BRD (SUTURE) IMPLANT
SUT VIC AB 4-0 PS2 18 (SUTURE) ×7 IMPLANT
SYR 10ML LL (SYRINGE) ×1 IMPLANT
SYS WOUND ALEXIS 18CM MED (MISCELLANEOUS)
SYSTEM WOUND ALEXIS 18CM MED (MISCELLANEOUS) IMPLANT
TOWEL OR NON WOVEN STRL DISP B (DISPOSABLE) IMPLANT
TRAP SPECIMEN MUCUS 40CC (MISCELLANEOUS) IMPLANT
TRAY FOLEY MTR SLVR 16FR STAT (SET/KITS/TRAYS/PACK) ×3 IMPLANT
TROCAR XCEL NON-BLD 5MMX100MML (ENDOMECHANICALS) IMPLANT
UNDERPAD 30X36 HEAVY ABSORB (UNDERPADS AND DIAPERS) ×6 IMPLANT
WATER STERILE IRR 1000ML POUR (IV SOLUTION) ×3 IMPLANT
YANKAUER SUCT BULB TIP 10FT TU (MISCELLANEOUS) IMPLANT

## 2021-10-21 NOTE — Discharge Instructions (Addendum)
AFTER SURGERY INSTRUCTIONS ?  ?Return to work: 4-6 weeks if applicable ? ?You will have a white honeycomb dressing over your larger incision. This dressing can be removed 5 days after surgery and you do not need to reapply a new dressing. Once you remove the dressing, you will notice that you have the surgical glue (dermabond) on the incision and this will peel off on its own. You can get this dressing wet in the shower the days after surgery prior to removal on the 5th day.  ?  ?Activity: ?1. Be up and out of the bed during the day.  Take a nap if needed.  You may walk up steps but be careful and use the hand rail.  Stair climbing will tire you more than you think, you may need to stop part way and rest.  ?  ?2. No lifting or straining for 6 weeks over 10 pounds. No pushing, pulling, straining for 6 weeks. ?  ?3. No driving for around 1 week(s).  Do not drive if you are taking narcotic pain medicine and make sure that your reaction time has returned.  ?  ?4. You can shower as soon as the next day after surgery. Shower daily.  Use your regular soap and water (not directly on the incision) and pat your incision(s) dry afterwards; don't rub.  No tub baths or submerging your body in water until cleared by your surgeon. If you have the soap that was given to you by pre-surgical testing that was used before surgery, you do not need to use it afterwards because this can irritate your incisions.  ?  ?5. No sexual activity and nothing in the vagina for 8 weeks. ?  ?6. You may experience a small amount of clear drainage from your incisions, which is normal.  If the drainage persists, increases, or changes color please call the office. ?  ?7. Do not use creams, lotions, or ointments such as neosporin on your incisions after surgery until advised by your surgeon because they can cause removal of the dermabond glue on your incisions.   ?  ?8. You may experience vaginal spotting after surgery or around the 6-8 week mark from  surgery when the stitches at the top of the vagina begin to dissolve.  The spotting is normal but if you experience heavy bleeding, call our office. ?  ?9. Take Tylenol or ibuprofen first for pain if you are able to take these medications and only use narcotic pain medication for severe pain not relieved by the Tylenol or Ibuprofen.  Monitor your Tylenol intake to a max of 4,000 mg in a 24 hour period. You can alternate these medications after surgery. ?  ?Diet: ?1. Low sodium Heart Healthy Diet is recommended but you are cleared to resume your normal (before surgery) diet after your procedure. ?  ?2. It is safe to use a laxative, such as Miralax or Colace, if you have difficulty moving your bowels. You have been prescribed Sennakot-S to take at bedtime every evening after surgery to keep bowel movements regular and to prevent constipation.   ?  ?Wound Care: ?1. Keep clean and dry.  Shower daily. ?  ?Reasons to call the Doctor: ?Fever - Oral temperature greater than 100.4 degrees Fahrenheit ?Foul-smelling vaginal discharge ?Difficulty urinating ?Nausea and vomiting ?Increased pain at the site of the incision that is unrelieved with pain medicine. ?Difficulty breathing with or without chest pain ?New calf pain especially if only on one side ?Sudden,  continuing increased vaginal bleeding with or without clots. ?  ?Contacts: ?For questions or concerns you should contact: ?  ?Dr. Jeral Pinch at [phone] ?  ?Joylene John, NP at [phone] ?  ?After Hours: call [phone] and have the GYN Oncologist paged/contacted (after 5 pm or on the weekends). ?  ?Messages sent via mychart are for non-urgent matters and are not responded to after hours so for urgent needs, please call the after hours number. ?

## 2021-10-21 NOTE — Anesthesia Postprocedure Evaluation (Signed)
Anesthesia Post Note ? ?Patient: Patty Estrada ? ?Procedure(s) Performed: XI ROBOTIC ASSISTED TOTAL HYSTERECTOMY WITH BILATERAL SALPINGO OOPHORECTOMY (Bilateral) ?SENTINEL NODE BIOPSY ? ?  ? ?Patient location during evaluation: PACU ?Anesthesia Type: General ?Level of consciousness: sedated ?Pain management: pain level controlled ?Vital Signs Assessment: post-procedure vital signs reviewed and stable ?Respiratory status: spontaneous breathing and respiratory function stable ?Cardiovascular status: stable ?Postop Assessment: no apparent nausea or vomiting ?Anesthetic complications: no ? ? ?No notable events documented. ? ?Last Vitals:  ?Vitals:  ? 10/21/21 1145 10/21/21 1200  ?BP: (!) 131/52 (!) 124/48  ?Pulse: 75 71  ?Resp: 14 12  ?Temp:    ?SpO2: 100% 100%  ?  ?Last Pain:  ?Vitals:  ? 10/21/21 1200  ?TempSrc:   ?PainSc: 6   ? ? ?  ?  ?  ?  ?  ?  ? ?Mellany Dinsmore DANIEL ? ? ? ? ?

## 2021-10-21 NOTE — Anesthesia Procedure Notes (Signed)
Procedure Name: Intubation ?Date/Time: 10/21/2021 7:41 AM ?Performed by: Garrel Ridgel, CRNA ?Pre-anesthesia Checklist: Patient identified, Emergency Drugs available, Suction available and Patient being monitored ?Patient Re-evaluated:Patient Re-evaluated prior to induction ?Oxygen Delivery Method: Circle system utilized ?Preoxygenation: Pre-oxygenation with 100% oxygen ?Induction Type: IV induction ?Ventilation: Mask ventilation without difficulty ?Laryngoscope Size: Mac and 3 ?Grade View: Grade II ?Tube type: Oral ?Tube size: 7.0 mm ?Number of attempts: 1 ?Airway Equipment and Method: Stylet and Oral airway ?Placement Confirmation: ETT inserted through vocal cords under direct vision, positive ETCO2 and breath sounds checked- equal and bilateral ?Secured at: 22 cm ?Tube secured with: Tape ?Dental Injury: Teeth and Oropharynx as per pre-operative assessment  ? ? ? ? ?

## 2021-10-21 NOTE — Anesthesia Procedure Notes (Signed)
Arterial Line Insertion ?Start/End4/18/2023 7:46 AM, 10/21/2021 7:50 AM ?Performed by: Montel Clock, CRNA, CRNA ? Preanesthetic checklist: patient identified, IV checked, site marked, monitors and equipment checked and pre-op evaluation ?Left, radial was placed ?Catheter size: 20 G ?Hand hygiene performed  ?Allen's test indicative of satisfactory collateral circulation ?Attempts: 1 ?Procedure performed without using ultrasound guided technique. ?Following insertion, dressing applied and Biopatch. ?Post procedure assessment: normal ? ?Patient tolerated the procedure well with no immediate complications. ? ? ?

## 2021-10-21 NOTE — Progress Notes (Signed)
?  Transition of Care (TOC) Screening Note ? ? ?Patient Details  ?Name: Patty Estrada ?Date of Birth: [birth year] ? ? ?Transition of Care (TOC) CM/SW Contact:    ?Carrisa Keller, LCSW ?Phone Number: ?10/21/2021, 2:10 PM ? ? ? ?Transition of Care Department [facility]) has reviewed patient and no TOC needs have been identified at this time. We will continue to monitor patient advancement through interdisciplinary progression rounds. If new patient transition needs arise, please place a TOC consult. ? ? ?

## 2021-10-21 NOTE — Interval H&P Note (Signed)
History and Physical Interval Note: ? ?10/21/2021 ?7:00 AM ? ?Patty Estrada  has presented today for surgery, with the diagnosis of ENDOMETRIAL CANCER.  The various methods of treatment have been discussed with the patient and family. After consideration of risks, benefits and other options for treatment, the patient has consented to  Procedure(s): ?XI ROBOTIC [place]; POSSIBLE LAPAROTOMY (Bilateral) ?SENTINEL NODE BIOPSY (N/A) ?LYMPH NODE DISSECTION (N/A) as a surgical intervention.  The patient's history has been reviewed, patient examined, no change in status, stable for surgery.  I have reviewed the patient's chart and labs.  Questions were answered to the patient's satisfaction.   ? ? ?Lafonda Mosses ? ? ?

## 2021-10-21 NOTE — Op Note (Addendum)
OPERATIVE NOTE ? ?Pre-operative Diagnosis: endometrial cancer grade 1 ? ?Post-operative Diagnosis: same ? ?Operation: Robotic-assisted laparoscopic total hysterectomy with bilateral salpingoophorectomy, SLN biopsy, oversew bladder serosa, mini-laparotomy  ?Extreme morbid obesity requiring additional OR personnel for positioning and retraction. Obesity made retroperitoneal visualization limited and increased the complexity of the case and necessitated additional instrumentation for retraction. Obesity related complexity increased the duration of the procedure by 60 minutes.  ? ?Surgeon: Jeral Pinch MD ? ?Assistant Surgeon: Joylene John, NP ? ?Anesthesia: GET ? ?Urine Output: 200 cc ? ?Operative Findings: On EUA, 12 cm mobile uterus. On intra-abdominal entry, normal upper abdominal survey. Normal omentum, small and large bowel. Enlarged 12 cm bulbous uterus. Normal appearing bilateral adnexa. Narrow pelvis with significant adiposity. Mapping successful to bilateral external iliac sentinel lymph nodes (both overlaying the external iliac vein). No gross adenopathy. No intra-abdominal or pelvic evidence of disease. Uterine specimen too large for transvaginal removal, requiring mini-lap for specimen delivery. ? ?Estimated Blood Loss:  100 cc     ? ?Total IV Fluids: see I&O flowsheet ?        ?Specimens: uterus, cervix, bilateral tubes and ovaries, bilateral external iliac SLNs ?        ?Complications:  None apparent; patient tolerated the procedure well. ?        ?Disposition: PACU - hemodynamically stable. ? ?Procedure Details  ?The patient was seen in the Holding Room. The risks, benefits, complications, treatment options, and expected outcomes were discussed with the patient.  The patient concurred with the proposed plan, giving informed consent.  The site of surgery properly noted/marked. The patient was identified as [facility] and the procedure verified as a Robotic-assisted hysterectomy with  bilateral salpingo oophorectomy with SLN biopsy.  ? ?After induction of anesthesia, the patient was draped and prepped in the usual sterile manner. Patient was placed in supine position after anesthesia and draped and prepped in the usual sterile manner as follows: Her arms were tucked to her side with all appropriate precautions.  The patient was secured to the bed with tape and padding..  The patient was placed in the semi-lithotomy position in [place]. Her panus was secured to her legs bilaterally. The perineum and vagina were prepped with CholoraPrep. The patient was draped after the CholoraPrep had been allowed to dry for 3 minutes.  A Time Out was held and the above information confirmed. ? ?The urethra was prepped with Betadine. Foley catheter was placed.  A sterile speculum was placed in the vagina.  The cervix was grasped with a single-tooth tenaculum. 69m total of ICG was injected into the cervical stroma at 2 and 9 o'clock with 1cc injected at a 1cm and 218mdepth (concentration 0.33m74ml) in all locations. The cervix was dilated with PraKennon Roundslators.  The Delineator 3.5 uterine manipulator with a colpotomizer ring was placed without difficulty.  A pneum occluder balloon was placed over the manipulator.  OG tube placement was confirmed and to suction.  ? ?Next, a 10 mm skin incision was made 1 cm below the subcostal margin in the midclavicular line.  The 5 mm Optiview port and scope was used for direct entry.  Opening pressure was under 10 mm CO2.  The abdomen was insufflated and the findings were noted as above.   At this point and all points during the procedure, the patient's intra-abdominal pressure did not exceed 15 mmHg. Next, an 8 mm skin incision was made superior to the umbilicus and a right and left port were  placed about 8 cm lateral to the robot port on the right and left side.  A fourth arm was placed on the right.  The 5 mm assist trocar was exchanged for a 10-12 mm port. All ports were  placed under direct visualization.  A second 5 mm assist port was placed between the 12 mm assist trocar and the left lateral robotic trocar. The patient was placed in steep Trendelenburg.  Bowel was folded away into the upper abdomen.  The robot was docked in the normal manner. ? ?The right and left peritoneum were opened parallel to the IP ligament to open the retroperitoneal spaces bilaterally. The round ligaments were transected. The SLN mapping was performed in bilateral pelvic basins. After identifying the ureters, the para rectal and paravesical spaces were opened up entirely with careful dissection below the level of the ureters bilaterally and to the depth of the uterine artery origin in order to skeletonize the uterine "web" and ensure visualization of all parametrial channels. The para-aortic basins were carefully exposed and evaluated for isolated para-aortic SLN's. Lymphatic channels were identified travelling to the following visualized sentinel lymph node's: bilateral external iliac SLNs. These SLN's were separated from their surrounding lymphatic tissue, removed and sent for permanent pathology. ? ?The hysterectomy was started.  The ureter was again noted to be on the medial leaf of the broad ligament.  The peritoneum above the ureter was incised and stretched and the infundibulopelvic ligament was skeletonized, cauterized and cut.  The posterior peritoneum was taken down to the level of the KOH ring.  The anterior peritoneum was also taken down.  The bladder flap was created to the level of the KOH ring.  The uterine artery on the right side was skeletonized, cauterized and cut in the normal manner.  A similar procedure was performed on the left.  The colpotomy was made and the uterus, cervix, bilateral ovaries and tubes were amputated and placed in a 15 mm Endocatch bag.   ? ?Some bleeding was noted from the bladder serosa along the midline. Hemostasis was achieved with two figure of eight sutures  using 2-0 Vicryl. The colpotomy at the vaginal cuff was closed with Vicryl on a CT1 needle in a running manner.  Irrigation was used and excellent hemostasis was achieved.  Pedicles were inspected and excellent hemostasis was achieved. Retorperitoneum was inspected bilaterally with good hemostasis noted. Arista was placed over operative bed after intra-abdominal pressure was dropped to 75m Hg to assure no bleeding.    ? ?At this point in the procedure was completed.  Robotic instruments were removed under direct visulaization.  The robot was undocked.  ? ?The supraumbilical trocar was removed and the incision extended 8-10 cm with a scalpel. The incision was carried down to and through the fascia, with the abdomen insufflated, using monopolar electrocautery. The peritoneal incision was extended under direct visualization. The endocatch bag with the uterine specimen was delivered through the incision. The incision was then closed with running #1 looped PDS tied in the midline. The subcutaneous tissue was irrigated and hemostasis achieved. Exparel was injected for local anesthesia. The subcutaneous tissue was closed with 2-0 Vicryl in running fashion.  ? ?The fascia at the 10-12 mm port was closed with 0 Vicryl on a UR-5 needle.  The subcuticular tissue of all incisions was closed with 4-0 Vicryl and the skin was closed with 4-0 Monocryl in a subcuticular manner.  Dermabond was applied.   ? ?The vagina was swabbed with  minimal  bleeding noted. Foley catheter was removed.  All sponge, lap and needle counts were correct x  3.  ? ?The patient was transferred to the recovery room in stable condition. ? ?Jeral Pinch, MD ? ?

## 2021-10-21 NOTE — Transfer of Care (Signed)
Immediate Anesthesia Transfer of Care Note ? ?Patient: Patty Estrada ? ?Procedure(s) Performed: XI ROBOTIC ASSISTED TOTAL HYSTERECTOMY WITH BILATERAL SALPINGO OOPHORECTOMY (Bilateral) ?SENTINEL NODE BIOPSY ? ?Patient Location: PACU ? ?Anesthesia Type:General ? ?Level of Consciousness: awake, alert , oriented and patient cooperative ? ?Airway & Oxygen Therapy: Patient Spontanous Breathing and Patient connected to face mask oxygen ? ?Post-op Assessment: Report given to RN and Post -op Vital signs reviewed and stable ? ?Post vital signs: Reviewed and stable ? ?Last Vitals:  ?Vitals Value Taken Time  ?BP 138/58 10/21/21 1130  ?Temp    ?Pulse 73 10/21/21 1133  ?Resp 14 10/21/21 1133  ?SpO2 100 % 10/21/21 1133  ?Vitals shown include unvalidated device data. ? ?Last Pain:  ?Vitals:  ? 10/21/21 0618  ?TempSrc:   ?PainSc: 2   ?   ? ?Patients Stated Pain Goal: 1 (10/21/21 0618) ? ?Complications: No notable events documented. ?

## 2021-10-22 ENCOUNTER — Encounter (HOSPITAL_COMMUNITY): Payer: Self-pay | Admitting: Gynecologic Oncology

## 2021-10-22 DIAGNOSIS — M1990 Unspecified osteoarthritis, unspecified site: Secondary | ICD-10-CM | POA: Diagnosis not present

## 2021-10-22 DIAGNOSIS — Z87891 Personal history of nicotine dependence: Secondary | ICD-10-CM | POA: Diagnosis not present

## 2021-10-22 DIAGNOSIS — C541 Malignant neoplasm of endometrium: Secondary | ICD-10-CM | POA: Diagnosis not present

## 2021-10-22 DIAGNOSIS — Z6841 Body Mass Index (BMI) 40.0 and over, adult: Secondary | ICD-10-CM | POA: Diagnosis not present

## 2021-10-22 DIAGNOSIS — F32A Depression, unspecified: Secondary | ICD-10-CM | POA: Diagnosis not present

## 2021-10-22 DIAGNOSIS — N8003 Adenomyosis of the uterus: Secondary | ICD-10-CM | POA: Diagnosis not present

## 2021-10-22 DIAGNOSIS — D259 Leiomyoma of uterus, unspecified: Secondary | ICD-10-CM | POA: Diagnosis not present

## 2021-10-22 DIAGNOSIS — K219 Gastro-esophageal reflux disease without esophagitis: Secondary | ICD-10-CM | POA: Diagnosis not present

## 2021-10-22 DIAGNOSIS — R7303 Prediabetes: Secondary | ICD-10-CM | POA: Diagnosis not present

## 2021-10-22 DIAGNOSIS — I10 Essential (primary) hypertension: Secondary | ICD-10-CM | POA: Diagnosis not present

## 2021-10-22 DIAGNOSIS — I4891 Unspecified atrial fibrillation: Secondary | ICD-10-CM | POA: Diagnosis not present

## 2021-10-22 LAB — CBC
HCT: 39.9 % (ref 36.0–46.0)
Hemoglobin: 13.2 g/dL (ref 12.0–15.0)
MCH: 31.7 pg (ref 26.0–34.0)
MCHC: 33.1 g/dL (ref 30.0–36.0)
MCV: 95.7 fL (ref 80.0–100.0)
Platelets: 209 10*3/uL (ref 150–400)
RBC: 4.17 MIL/uL (ref 3.87–5.11)
RDW: 12.5 % (ref 11.5–15.5)
WBC: 11.3 10*3/uL — ABNORMAL HIGH (ref 4.0–10.5)
nRBC: 0 % (ref 0.0–0.2)

## 2021-10-22 LAB — BASIC METABOLIC PANEL
Anion gap: 5 (ref 5–15)
BUN: 10 mg/dL (ref 8–23)
CO2: 28 mmol/L (ref 22–32)
Calcium: 9.9 mg/dL (ref 8.9–10.3)
Chloride: 108 mmol/L (ref 98–111)
Creatinine, Ser: 0.69 mg/dL (ref 0.44–1.00)
GFR, Estimated: >60 mL/min (ref >60)
Glucose, Bld: 110 mg/dL — ABNORMAL HIGH (ref 70–99)
Potassium: 4.9 mmol/L (ref 3.5–5.1)
Sodium: 141 mmol/L (ref 135–145)

## 2021-10-22 MED ORDER — SENNOSIDES-DOCUSATE SODIUM 8.6-50 MG PO TABS: 2.0000 | ORAL_TABLET | Freq: Every day | ORAL | 0 refills | Status: DC

## 2021-10-22 MED ORDER — TRAMADOL HCL 50 MG PO TABS: 50.0000 mg | ORAL_TABLET | Freq: Four times a day (QID) | ORAL | 0 refills | Status: DC | PRN

## 2021-10-22 NOTE — Discharge Summary (Signed)
?Physician Discharge Summary  ?Patient ID: ?[employer] ?MRN: [id number] ?DOB/AGE: 10-27-1950 71 y.o. ? ?Admit date: 10/21/2021 ?Discharge date: 10/22/2021 ? ?Admission Diagnoses: Endometrial cancer ([place]) ? ?Discharge Diagnoses:  ?Principal Problem: ?  Endometrial cancer ([place]) ? ? ?Discharged Condition:  The patient is in good condition and stable for discharge.   ? ?Hospital Course: On 10/21/2021, the patient underwent the following: Procedure(s): XI ROBOTIC ASSISTED TOTAL HYSTERECTOMY WITH BILATERAL SALPINGO OOPHORECTOMY, MINI-LAPAROTOMY SENTINEL NODE BIOPSY. Due to co-morbid conditions, increased length of surgery, and prior ventilation issues under anesthesia, decision made to observe patient overnight. The postoperative course was uneventful.  She was discharged to home on postoperative day 1 tolerating a regular diet, ambulating, voiding, pain controlled. ? ?Consults: None ? ?Significant Diagnostic Studies: AM labs ? ?Treatments: surgery: see above ? ?Discharge Exam: ?Blood pressure (!) 135/53, pulse 88, temperature 98.5 ?F (36.9 ?C), temperature source Oral, resp. rate 15, height 5' 5"  (1.651 m), weight 242 lb 1 oz (109.8 kg), SpO2 98 %. ?General appearance: alert, cooperative, and no distress ?Resp: clear to auscultation bilaterally ?Cardio: regular rate and rhythm, S1, S2 normal, no murmur, click, rub or gallop ?GI: mildly hypoactive bowel sounds, abdomen soft ?Extremities: extremities normal, atraumatic, no cyanosis or edema ?Incision/Wound: lap sites to the abdomen with dermabond intact with no drainage, mini laparotomy incision in mid abdomen intact with op site dressing in place ? ?Disposition: Discharge disposition: 01-Home or Self Care ? ? ? ? ? ? ?Discharge Instructions   ? ? Call MD for:  difficulty breathing, headache or visual disturbances   Complete by: As directed ?  ? Call MD for:  extreme fatigue   Complete by: As directed ?  ? Call MD for:  hives   Complete by: As directed ?  ? Call MD  for:  persistant dizziness or light-headedness   Complete by: As directed ?  ? Call MD for:  persistant nausea and vomiting   Complete by: As directed ?  ? Call MD for:  redness, tenderness, or signs of infection (pain, swelling, redness, odor or green/yellow discharge around incision site)   Complete by: As directed ?  ? Call MD for:  severe uncontrolled pain   Complete by: As directed ?  ? Call MD for:  temperature >100.4   Complete by: As directed ?  ? Diet - low sodium heart healthy   Complete by: As directed ?  ? Discharge wound care:   Complete by: As directed ?  ? You will have a white honeycomb dressing over your larger incision. This dressing can be removed 5 days after surgery and you do not need to reapply a new dressing. Once you remove the dressing, you will notice that you have the surgical glue (dermabond) on the incision and this will peel off on its own. You can get this dressing wet in the shower the days after surgery prior to removal on the 5th day.  ? Driving Restrictions   Complete by: As directed ?  ? No driving for around 1 week.  Do not take narcotics and drive. You need to make sure your reaction time has returned.  ? Increase activity slowly   Complete by: As directed ?  ? Lifting restrictions   Complete by: As directed ?  ? No lifting greater than 10 lbs, pushing, pulling, straining for 6 weeks.  ? Sexual Activity Restrictions   Complete by: As directed ?  ? No sexual activity, nothing in the vagina, for 8 weeks.  ? ?  ? ?  Allergies as of 10/22/2021   ? ?   Reactions  ? Gabapentin   ? REACTION: confusion  ? Nitrofurantoin Hives  ? ?  ? ?  ?Medication List  ?  ? ?TAKE these medications   ? ?acetaminophen 650 MG CR tablet ?Commonly known as: TYLENOL ?Take 1,300 mg by mouth every 8 (eight) hours as needed for pain. ?  ?atenolol 50 MG tablet ?Commonly known as: TENORMIN ?Take 50 mg by mouth at bedtime. ?  ?atorvastatin 40 MG tablet ?Commonly known as: LIPITOR ?Take 40 mg by mouth at bedtime. ?   ?benazepril 20 MG tablet ?Commonly known as: LOTENSIN ?Take 40 mg by mouth at bedtime. ?  ?diclofenac sodium 1 % Gel ?Commonly known as: VOLTAREN ?Apply 1 application topically 2 (two) times daily as needed (knee pain). ?  ?methocarbamol 500 MG tablet ?Commonly known as: ROBAXIN ?Take 500 mg by mouth every 6 (six) hours as needed for muscle spasms. ?  ?mirtazapine 15 MG tablet ?Commonly known as: REMERON ?Take 15 mg by mouth at bedtime. ?  ?multivitamin tablet ?Take 1 tablet by mouth daily. Centrum Silver Multivitamin ?  ?OVER THE COUNTER MEDICATION ?Take 2 each by mouth daily. Super Beet 2 chews daily ?  ?polyethylene glycol powder 17 GM/SCOOP powder ?Commonly known as: GLYCOLAX/MIRALAX ?MIX 1 CAPFUL IN LIQUID AT BEDTIME AS NEEDED FOR CONSTIPATION. ?What changed: See the new instructions. ?  ?senna-docusate 8.6-50 MG tablet ?Commonly known as: Senokot-S ?Take 2 tablets by mouth at bedtime. For AFTER surgery, do not take if having diarrhea ?  ?sodium-potassium bicarbonate Tbef dissolvable tablet ?Commonly known as: ALKA-SELTZER GOLD ?Take 2 tablets by mouth 2 (two) times daily as needed (heartburn). ?  ?traMADol 50 MG tablet ?Commonly known as: ULTRAM ?Take 1 tablet (50 mg total) by mouth every 6 (six) hours as needed for severe pain. For AFTER surgery only, do not take and drive ?  ? ?  ? ?  ?  ? ? ?  ?Discharge Care Instructions  ?(From admission, onward)  ?  ? ? ?  ? ?  Start     Ordered  ? 10/22/21 0000  Discharge wound care:       ?Comments: You will have a white honeycomb dressing over your larger incision. This dressing can be removed 5 days after surgery and you do not need to reapply a new dressing. Once you remove the dressing, you will notice that you have the surgical glue (dermabond) on the incision and this will peel off on its own. You can get this dressing wet in the shower the days after surgery prior to removal on the 5th day.  ? 10/22/21 0837  ? ?  ?  ? ?  ? ? Follow-up Information   ? ? Lafonda Mosses, MD Follow up on 10/28/2021.   ?Specialty: Gynecologic Oncology ?Why: at 4:20pm will be a PHONE visit. IN PERSON visit will be in around 3-4 weeks. ?Contact information: ?[address] ?[place] [address] ?[phone] ? ? ?  ?  ? ?  ?  ? ?  ? ? ?Greater than thirty minutes were spend for face to face discharge instructions and discharge orders/summary in EPIC.  ? ?Signed: ?Nicholle Falzon D Brittani Purdum ?10/22/2021, 8:39 AM ? ? ? ?  ?

## 2021-10-23 ENCOUNTER — Telehealth: Payer: Self-pay | Admitting: *Deleted

## 2021-10-23 NOTE — Telephone Encounter (Signed)
Spoke with Patty Estrada this morning. She states she is eating, drinking and urinating well. She has not had a BM yet but is passing gas. She is taking senokot as prescribed and encouraged her to drink plenty of water. She denies fever or chills. Incisions are dry and intact. She rates her pain 3/10. Her pain is controlled with Tylenol.    ? ?Instructed to call office with any fever, chills, purulent drainage, uncontrolled pain or any other questions or concerns. Patient verbalizes understanding.  ? ?Pt aware of post op appointments as well as the office number [phone] and after hours number [phone] to call if she has any questions or concerns  ?

## 2021-10-24 LAB — SURGICAL PATHOLOGY

## 2021-10-28 ENCOUNTER — Encounter: Payer: Self-pay | Admitting: Gynecologic Oncology

## 2021-10-28 ENCOUNTER — Inpatient Hospital Stay: Payer: Medicare Other | Attending: Gynecologic Oncology | Admitting: Gynecologic Oncology

## 2021-10-28 DIAGNOSIS — Z7189 Other specified counseling: Secondary | ICD-10-CM

## 2021-10-28 DIAGNOSIS — C541 Malignant neoplasm of endometrium: Secondary | ICD-10-CM

## 2021-10-28 DIAGNOSIS — Z90710 Acquired absence of both cervix and uterus: Secondary | ICD-10-CM

## 2021-10-28 DIAGNOSIS — Z90722 Acquired absence of ovaries, bilateral: Secondary | ICD-10-CM

## 2021-10-28 NOTE — Progress Notes (Signed)
Gynecologic Oncology Telehealth Consult Note: Gyn-Onc ? ?I connected with Patty Estrada on 10/28/21 at  4:20 PM EDT by telephone and verified that I am speaking with the correct person using two identifiers. ? ?I discussed the limitations, risks, security and privacy concerns of performing an evaluation and management service by telemedicine and the availability of in-person appointments. I also discussed with the patient that there may be a patient responsible charge related to this service. The patient expressed understanding and agreed to proceed. ? ?Other persons participating in the visit and their role in the encounter: None. ? ?Patient's location: Home ?Provider's location: [facility] ? ?Reason for Visit: Follow-up after surgery, pathology review ? ?Treatment History: ?Oncology History  ?Endometrial adenocarcinoma ([place])  ?10/28/2020 Initial Biopsy  ? EMB: Grade 1 endometrioid adenoca ?  ?10/28/2020 Initial Diagnosis  ? Endometrial adenocarcinoma [facility]) ?  ?12/19/2020 Surgery  ? D&C: endometrioid adenoca, grade 1, with associated CAH ? ?IHC MMR intact ?  ?07/02/2021 Pathology Results  ? EMB ?A. ENDOMETRIAL, BIOPSY:  ?- Endometrioid adenocarcinoma, FIGO grade 1, associated with complex  ?atypical hyperplasia. ?  ?10/02/2021 Pathology Results  ? EMB ?A. ENDOMETRIUM, BIOPSY:  ?Adenocarcinoma of the endometrium, endometrioid type, FIGO grade 1.  ?Progesterone induced changes in the endometrial stroma.  ?Please see comment.  ?  ?10/21/2021 Surgery  ? TRH/BSO, SLN bx, oversew bladder serosa, mini-lap ? ?Findings: On EUA, 12 cm mobile uterus. On intra-abdominal entry, normal upper abdominal survey. Normal omentum, small and large bowel. Enlarged 12 cm bulbous uterus. Normal appearing bilateral adnexa. Narrow pelvis with significant adiposity. Mapping successful to bilateral external iliac sentinel lymph nodes (both overlaying the external iliac vein). No gross adenopathy. No intra-abdominal or pelvic evidence of  disease. Uterine specimen too large for transvaginal removal, requiring mini-lap for specimen delivery. ?  ?10/21/2021 Pathology Results  ? A.   SENTINEL LYMPH NODE, RIGHT EXTERNAL ILIAC, BIOPSY:  ?-    Lymph node negative for malignancy (0/1).  ? ?B.   SENTINEL LYMPH NODE, LEFT EXTERNAL ILIAC, BIOPSY:  ?-    Lymph node negative for malignancy (0/1).  ? ?C.   UTERUS, CERVIX, BILATERAL TUBES AND OVARIES:  ?-    Endometrial endometrioid carcinoma, FIGO grade 1, in a background  ?of endometrial intraepithelial neoplasia (EIN)/atypical endometrial  ?hyperplasia, with "MELF" (microcystic, elongated and fragmented) pattern  ?of myoinvasion limited to inner half of myometrium, and with extensive  ?involvement of adenomyosis.  ? ?-    Cervix:             Negative for dysplasia.  ?-    Endometrium         Intrauterine device.  ?-    Myometrium:         Adenomyosis.  ?Two leiomyomas, largest 2.5 cm in greatest dimension.  ?-    Ovaries:       Senescent ovaries, negative for malignancy.  ?-    Fallopian tubes:    Negative for malignancy.  ? ?ONCOLOGY TABLE:  ? ?UTERUS, CARCINOMA OR CARCINOSARCOMA: Resection  ? ?Procedure: Total hysterectomy and bilateral salpingo-oophorectomy  ?Histologic Type: Endometrioid  ?Histologic Grade: FIGO Grade 1  ?Myometrial Invasion:  ?     Depth of Myometrial Invasion (mm): 15 mm ("MELF") pattern  ?     Myometrial Thickness (mm): 45 mm  ?     Percentage of Myometrial Invasion: 33%  ?Uterine Serosa Involvement: Not identified  ?Cervical stromal Involvement: Not identified  ?Extent of involvement of other tissue/organs: Not identified  ?Peritoneal/Ascitic  Fluid: Not submitted/unknown  ?Lymphovascular Invasion: Not identified  ?Regional Lymph Nodes: Regional lymph nodes present, all lymph nodes  ?negative for tumor cells  ?     Pelvic Lymph Nodes Examined:  ?    2 Sentinel  ?                              0 Non-sentinel  ?                              2 Total  ?     Pelvic Lymph Nodes with Metastasis:  0  ?     Para-aortic Lymph Nodes Examined:  0  ?Distant Metastasis:  ?     Distant Site(s) Involved: Not applicable  ?  ?11/18/2021 Imaging  ? Pelvic ultrasound ? ?Uterus                      12.2 x 9.3 x 9.7 cm, Total uterine volume 572 cc,enlarged homogeneous anteverted uterus,wnl ?  ?Endometrium          42 mm, symmetrical, homogeneous thickened endometrium with color flow ?  ?Right ovary             2.4 x 2.9 x 2.3 cm, wnl,limited view  ?  ?Left ovary                3.2 x 2.4 x 2.6 cm, wnl,limited view  ?  ?No free fluid ?  ? ? ?Interval History: ?Doing very well. ?Denies any significant pain, just some soreness, improving daily. ?Bowel function is normal, no longer needing sennakot. Using her baseline Miralax dosing. ?Denies urinary symptoms. ?Appetite normal, no nausea or emesis.  ? ?Past Medical/Surgical History: ?Past Medical History:  ?Diagnosis Date  ? Adenomatous colon polyp 2006  ?    ? Arthritis   ? Atrial fibrillation ([place]) 2013  ? Depression   ? Dysrhythmia 2013  ? a-fib  ? Endometrial cancer ([place])   ? Fatty liver   ? ?cirrhosis on u/s in 2008  ? GERD (gastroesophageal reflux disease)   ? Headache   ? sinus  ? HTN (hypertension)   ? Hyperglycemia   ? Hyperlipidemia   ? NASH (nonalcoholic steatohepatitis)   ? Obesity   ? Peripheral vascular disease ([place]) 2003  ? Wt was 408 lbs  ? Pre-diabetes   ? Urinary incontinence   ? Vitamin D deficiency   ? ? ?Past Surgical History:  ?Procedure Laterality Date  ? COLONOSCOPY  04/09/2005  ? adenomatous polyps, two diverticula  ? COLONOSCOPY  12/2010  ? RMR: left-sided diverticula, adenomatous polyps, next TCS due 12/2015  ? DILATION AND CURETTAGE OF UTERUS  2015  ? DILATION AND CURETTAGE OF UTERUS N/A 12/19/2020  ? Procedure: POSSIBLE DILATATION AND CURETTAGE;  Surgeon: Lafonda Mosses, MD;  Location: [facility];  Service: Gynecology;  Laterality: N/A;  ? ESOPHAGOGASTRODUODENOSCOPY  2006  ? hyperplastic polyps, hh  ? ESOPHAGOGASTRODUODENOSCOPY  12/2010  ? RMR:hh,  multiple benign gastric polyps,   ? HYSTEROSCOPY  02/27/2014  ? Procedure: HYSTEROSCOPY;  Surgeon: Jonnie Kind, MD;  Location: [facility];  Service: Gynecology;;  ? INTRAUTERINE DEVICE (IUD) INSERTION N/A 12/19/2020  ? Procedure: POSSIBLE INTRAUTERINE DEVICE (IUD) INSERTION;  Surgeon: Lafonda Mosses, MD;  Location: [facility];  Service: Gynecology;  Laterality: N/A;  ? LAPAROSCOPY N/A 12/19/2020  ? Procedure:  Diagnostic Laparoscopy;  Surgeon: Lafonda Mosses, MD;  Location: [facility];  Service: Gynecology;  Laterality: N/A;  ? POLYPECTOMY N/A 02/27/2014  ? Procedure: REMOVAL OF ENDOCERVICAL POLYP AND ENDOMETRIAL POLYPS;  Surgeon: Jonnie Kind, MD;  Location: [facility];  Service: Gynecology;  Laterality: N/A;  ? ROBOTIC ASSISTED TOTAL HYSTERECTOMY WITH BILATERAL SALPINGO OOPHERECTOMY Bilateral 10/21/2021  ? Procedure: XI ROBOTIC ASSISTED TOTAL HYSTERECTOMY WITH BILATERAL SALPINGO OOPHORECTOMY, MINI-LAPAROTOMY;  Surgeon: Lafonda Mosses, MD;  Location: [facility];  Service: Gynecology;  Laterality: Bilateral;  ? SENTINEL NODE BIOPSY N/A 10/21/2021  ? Procedure: SENTINEL NODE BIOPSY;  Surgeon: Lafonda Mosses, MD;  Location: [facility];  Service: Gynecology;  Laterality: N/A;  ? TUBAL LIGATION    ? WISDOM TOOTH EXTRACTION    ? ? ?Family History  ?Problem Relation Age of Onset  ? Breast cancer Mother 10  ? Heart attack Father 71  ? Heart disease Brother   ? Heart attack Brother   ? Colon cancer Neg Hx   ? Liver disease Neg Hx   ? Uterine cancer Neg Hx   ? Ovarian cancer Neg Hx   ? ? ?Social History  ? ?Socioeconomic History  ? Marital status: Married  ?  Spouse name: Not on file  ? Number of children: 2  ? Years of education: Not on file  ? Highest education level: Not on file  ?Occupational History  ? Occupation: retired  ?Tobacco Use  ? Smoking status: Former  ?  Packs/day: 2.00  ?  Years: 13.00  ?  Pack years: 26.00  ?  Types: Cigarettes  ?  Quit date: 07/06/1984  ?  Years since quitting: 37.3  ? Smokeless tobacco:  Never  ? Tobacco comments:  ?  Quit since 1986  ?Vaping Use  ? Vaping Use: Never used  ?Substance and Sexual Activity  ? Alcohol use: Yes  ?  Alcohol/week: 0.0 standard drinks  ?  Comment: maybe one or two drin

## 2021-11-11 ENCOUNTER — Encounter: Payer: Self-pay | Admitting: Gynecologic Oncology

## 2021-11-13 ENCOUNTER — Inpatient Hospital Stay: Payer: Medicare Other | Attending: Gynecologic Oncology | Admitting: Gynecologic Oncology

## 2021-11-13 ENCOUNTER — Encounter: Payer: Self-pay | Admitting: Gynecologic Oncology

## 2021-11-13 ENCOUNTER — Other Ambulatory Visit: Payer: Self-pay

## 2021-11-13 VITALS — BP 124/69 | HR 82 | Temp 98.5°F | Resp 16 | Ht 65.0 in | Wt 246.8 lb

## 2021-11-13 DIAGNOSIS — C541 Malignant neoplasm of endometrium: Secondary | ICD-10-CM

## 2021-11-13 DIAGNOSIS — Z90722 Acquired absence of ovaries, bilateral: Secondary | ICD-10-CM

## 2021-11-13 DIAGNOSIS — Z7189 Other specified counseling: Secondary | ICD-10-CM

## 2021-11-13 DIAGNOSIS — Z90710 Acquired absence of both cervix and uterus: Secondary | ICD-10-CM

## 2021-11-13 NOTE — Progress Notes (Signed)
Gynecologic Oncology Return Clinic Visit ? ?11/13/2021 ? ?Reason for Visit: Follow-up after recent surgery, treatment planning ? ?Treatment History: ?Oncology History  ?Endometrial adenocarcinoma ([place])  ?10/28/2020 Initial Biopsy  ? EMB: Grade 1 endometrioid adenoca ?  ?10/28/2020 Initial Diagnosis  ? Endometrial adenocarcinoma [facility]) ?  ?12/19/2020 Surgery  ? D&C: endometrioid adenoca, grade 1, with associated CAH ? ?IHC MMR intact ?  ?07/02/2021 Pathology Results  ? EMB ?A. ENDOMETRIAL, BIOPSY:  ?- Endometrioid adenocarcinoma, FIGO grade 1, associated with complex  ?atypical hyperplasia. ?  ?10/02/2021 Pathology Results  ? EMB ?A. ENDOMETRIUM, BIOPSY:  ?Adenocarcinoma of the endometrium, endometrioid type, FIGO grade 1.  ?Progesterone induced changes in the endometrial stroma.  ?Please see comment.  ?  ?10/21/2021 Surgery  ? TRH/BSO, SLN bx, oversew bladder serosa, mini-lap ? ?Findings: On EUA, 12 cm mobile uterus. On intra-abdominal entry, normal upper abdominal survey. Normal omentum, small and large bowel. Enlarged 12 cm bulbous uterus. Normal appearing bilateral adnexa. Narrow pelvis with significant adiposity. Mapping successful to bilateral external iliac sentinel lymph nodes (both overlaying the external iliac vein). No gross adenopathy. No intra-abdominal or pelvic evidence of disease. Uterine specimen too large for transvaginal removal, requiring mini-lap for specimen delivery. ?  ?10/21/2021 Pathology Results  ? A.   SENTINEL LYMPH NODE, RIGHT EXTERNAL ILIAC, BIOPSY:  ?-    Lymph node negative for malignancy (0/1).  ? ?B.   SENTINEL LYMPH NODE, LEFT EXTERNAL ILIAC, BIOPSY:  ?-    Lymph node negative for malignancy (0/1).  ? ?C.   UTERUS, CERVIX, BILATERAL TUBES AND OVARIES:  ?-    Endometrial endometrioid carcinoma, FIGO grade 1, in a background  ?of endometrial intraepithelial neoplasia (EIN)/atypical endometrial  ?hyperplasia, with "MELF" (microcystic, elongated and fragmented) pattern  ?of myoinvasion limited  to inner half of myometrium, and with extensive  ?involvement of adenomyosis.  ? ?-    Cervix:             Negative for dysplasia.  ?-    Endometrium         Intrauterine device.  ?-    Myometrium:         Adenomyosis.  ?Two leiomyomas, largest 2.5 cm in greatest dimension.  ?-    Ovaries:       Senescent ovaries, negative for malignancy.  ?-    Fallopian tubes:    Negative for malignancy.  ? ?ONCOLOGY TABLE:  ? ?UTERUS, CARCINOMA OR CARCINOSARCOMA: Resection  ? ?Procedure: Total hysterectomy and bilateral salpingo-oophorectomy  ?Histologic Type: Endometrioid  ?Histologic Grade: FIGO Grade 1  ?Myometrial Invasion:  ?     Depth of Myometrial Invasion (mm): 15 mm ("MELF") pattern  ?     Myometrial Thickness (mm): 45 mm  ?     Percentage of Myometrial Invasion: 33%  ?Uterine Serosa Involvement: Not identified  ?Cervical stromal Involvement: Not identified  ?Extent of involvement of other tissue/organs: Not identified  ?Peritoneal/Ascitic Fluid: Not submitted/unknown  ?Lymphovascular Invasion: Not identified  ?Regional Lymph Nodes: Regional lymph nodes present, all lymph nodes  ?negative for tumor cells  ?     Pelvic Lymph Nodes Examined:  ?    2 Sentinel  ?                              0 Non-sentinel  ?  2 Total  ?     Pelvic Lymph Nodes with Metastasis: 0  ?     Para-aortic Lymph Nodes Examined:  0  ?Distant Metastasis:  ?     Distant Site(s) Involved: Not applicable  ?  ?11/18/2021 Imaging  ? Pelvic ultrasound ? ?Uterus                      12.2 x 9.3 x 9.7 cm, Total uterine volume 572 cc,enlarged homogeneous anteverted uterus,wnl ?  ?Endometrium          42 mm, symmetrical, homogeneous thickened endometrium with color flow ?  ?Right ovary             2.4 x 2.9 x 2.3 cm, wnl,limited view  ?  ?Left ovary                3.2 x 2.4 x 2.6 cm, wnl,limited view  ?  ?No free fluid ?  ? ? ?Interval History: ?Patient reports overall doing well.  She notes occasional pain in her pelvis if her bladder  gets full or right before and after having bowel movement.  This does not happen every time.  She is unsure whether it is improving or she just noticed it more recently over the last week because her other pain had improved or resolved.  Endorses a good appetite without nausea or emesis.  Reports normal bowel and bladder function.  Denies any vaginal bleeding or discharge. ? ?Past Medical/Surgical History: ?Past Medical History:  ?Diagnosis Date  ? Adenomatous colon polyp 2006  ?    ? Arthritis   ? Atrial fibrillation ([place]) 2013  ? Depression   ? Dysrhythmia 2013  ? a-fib  ? Endometrial cancer ([place])   ? Fatty liver   ? ?cirrhosis on u/s in 2008  ? GERD (gastroesophageal reflux disease)   ? Headache   ? sinus  ? HTN (hypertension)   ? Hyperglycemia   ? Hyperlipidemia   ? NASH (nonalcoholic steatohepatitis)   ? Obesity   ? Peripheral vascular disease ([place]) 2003  ? Wt was 408 lbs  ? Pre-diabetes   ? Urinary incontinence   ? Vitamin D deficiency   ? ? ?Past Surgical History:  ?Procedure Laterality Date  ? COLONOSCOPY  04/09/2005  ? adenomatous polyps, two diverticula  ? COLONOSCOPY  12/2010  ? RMR: left-sided diverticula, adenomatous polyps, next TCS due 12/2015  ? DILATION AND CURETTAGE OF UTERUS  2015  ? DILATION AND CURETTAGE OF UTERUS N/A 12/19/2020  ? Procedure: POSSIBLE DILATATION AND CURETTAGE;  Surgeon: Lafonda Mosses, MD;  Location: [facility];  Service: Gynecology;  Laterality: N/A;  ? ESOPHAGOGASTRODUODENOSCOPY  2006  ? hyperplastic polyps, hh  ? ESOPHAGOGASTRODUODENOSCOPY  12/2010  ? RMR:hh, multiple benign gastric polyps,   ? HYSTEROSCOPY  02/27/2014  ? Procedure: HYSTEROSCOPY;  Surgeon: Jonnie Kind, MD;  Location: [facility];  Service: Gynecology;;  ? INTRAUTERINE DEVICE (IUD) INSERTION N/A 12/19/2020  ? Procedure: POSSIBLE INTRAUTERINE DEVICE (IUD) INSERTION;  Surgeon: Lafonda Mosses, MD;  [facility]: [facility];  Service: Gynecology;  Laterality: N/A;  ? LAPAROSCOPY N/A 12/19/2020  ? Procedure: Diagnostic  Laparoscopy;  Surgeon: Lafonda Mosses, MD;  [facility];  Service: Gynecology;  Laterality: N/A;  ? POLYPECTOMY N/A 02/27/2014  ? Procedure: REMOVAL OF ENDOCERVICAL POLYP AND ENDOMETRIAL POLYPS;  Surgeon: Jonnie Kind, MD;  Location: [facility];  Service: Gynecology;  Laterality: N/A;  ? ROBOTIC ASSISTED TOTAL HYSTERECTOMY WITH BILATERAL  SALPINGO OOPHERECTOMY Bilateral 10/21/2021  ? Procedure: XI ROBOTIC ASSISTED TOTAL HYSTERECTOMY WITH BILATERAL SALPINGO OOPHORECTOMY, MINI-LAPAROTOMY;  Surgeon: Lafonda Mosses, MD;  [facility];  Service: Gynecology;  Laterality: Bilateral;  ? SENTINEL NODE BIOPSY N/A 10/21/2021  ? Procedure: SENTINEL NODE BIOPSY;  Surgeon: Lafonda Mosses, MD;  [facility];  Service: Gynecology;  Laterality: N/A;  ? TUBAL LIGATION    ? WISDOM TOOTH EXTRACTION    ? ? ?Family History  ?Problem Relation Age of Onset  ? Breast cancer Mother 66  ? Heart attack Father 53  ? Heart disease Brother   ? Heart attack Brother   ? Colon cancer Neg Hx   ? Liver disease Neg Hx   ? Uterine cancer Neg Hx   ? Ovarian cancer Neg Hx   ? ? ?Social History  ? ?Socioeconomic History  ? Marital status: Married  ?  Spouse name: Not on file  ? Number of children: 2  ? Years of education: Not on file  ? Highest education level: Not on file  ?Occupational History  ? Occupation: retired  ?Tobacco Use  ? Smoking status: Former  ?  Packs/day: 2.00  ?  Years: 13.00  ?  Pack years: 26.00  ?  Types: Cigarettes  ?  Quit date: 07/06/1984  ?  Years since quitting: 37.3  ? Smokeless tobacco: Never  ? Tobacco comments:  ?  Quit since 1986  ?Vaping Use  ? Vaping Use: Never used  ?Substance and Sexual Activity  ? Alcohol use: Yes  ?  Alcohol/week: 0.0 standard drinks  ?  Comment: maybe one or two drinks a year  ? Drug use: No  ? Sexual activity: Not Currently  ?  Birth control/protection: Surgical, Post-menopausal  ?Other Topics Concern  ? Not on file  ?Social History Narrative  ? 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  ? ?Social Determinants of Health  ? ?Financial Resource Strain: Not on file  ?Food Insecurity: Not on file  ?Transportation Needs: Not on file  ?Physical Activity: Not on file  ?Stress: Not o

## 2021-11-13 NOTE — Patient Instructions (Addendum)
It was good to see you today.  You are healing well from surgery.  Remember, no heavy lifting for 6 weeks after surgery and nothing in the vagina for at least 8. ? ?If the discomfort in your pelvis when her bladder gets full when you have bowel movements does not improve over the next 3-4 weeks, please call my office. ? ?The follow-up plan will be for visits with an exam every 6 months.  This can be alternating between my office and your OB/GYN.  If you were to develop any concerning symptoms before your next visit, please call to be seen sooner.  These include vaginal bleeding, pelvic pain, change to bowel function, and unintentional weight loss. ? ?We will plan to have your first follow-up visit with me.  Since my schedule is not out past the fall, please call back in September or October to schedule a visit to see me in November. ?

## 2022-01-09 DIAGNOSIS — E785 Hyperlipidemia, unspecified: Secondary | ICD-10-CM | POA: Diagnosis not present

## 2022-01-09 DIAGNOSIS — E118 Type 2 diabetes mellitus with unspecified complications: Secondary | ICD-10-CM | POA: Diagnosis not present

## 2022-01-09 DIAGNOSIS — Z79899 Other long term (current) drug therapy: Secondary | ICD-10-CM | POA: Diagnosis not present

## 2022-01-16 DIAGNOSIS — R7309 Other abnormal glucose: Secondary | ICD-10-CM | POA: Diagnosis not present

## 2022-01-16 DIAGNOSIS — E1169 Type 2 diabetes mellitus with other specified complication: Secondary | ICD-10-CM | POA: Diagnosis not present

## 2022-01-16 DIAGNOSIS — I10 Essential (primary) hypertension: Secondary | ICD-10-CM | POA: Diagnosis not present

## 2022-02-18 DIAGNOSIS — H5211 Myopia, right eye: Secondary | ICD-10-CM | POA: Diagnosis not present

## 2022-05-01 DIAGNOSIS — E118 Type 2 diabetes mellitus with unspecified complications: Secondary | ICD-10-CM | POA: Diagnosis not present

## 2022-05-01 DIAGNOSIS — Z79899 Other long term (current) drug therapy: Secondary | ICD-10-CM | POA: Diagnosis not present

## 2022-05-01 DIAGNOSIS — I10 Essential (primary) hypertension: Secondary | ICD-10-CM | POA: Diagnosis not present

## 2022-05-08 DIAGNOSIS — E1169 Type 2 diabetes mellitus with other specified complication: Secondary | ICD-10-CM | POA: Diagnosis not present

## 2022-05-08 DIAGNOSIS — I10 Essential (primary) hypertension: Secondary | ICD-10-CM | POA: Diagnosis not present

## 2022-05-08 DIAGNOSIS — E210 Primary hyperparathyroidism: Secondary | ICD-10-CM | POA: Diagnosis not present

## 2022-05-08 DIAGNOSIS — Z23 Encounter for immunization: Secondary | ICD-10-CM | POA: Diagnosis not present

## 2022-05-19 ENCOUNTER — Inpatient Hospital Stay: Payer: Medicare Other | Attending: Gynecologic Oncology | Admitting: Gynecologic Oncology

## 2022-05-19 ENCOUNTER — Encounter: Payer: Self-pay | Admitting: Gynecologic Oncology

## 2022-05-19 VITALS — BP 148/61 | HR 86 | Temp 98.6°F | Resp 18 | Ht 65.35 in | Wt 252.4 lb

## 2022-05-19 DIAGNOSIS — Z90722 Acquired absence of ovaries, bilateral: Secondary | ICD-10-CM | POA: Insufficient documentation

## 2022-05-19 DIAGNOSIS — Z90710 Acquired absence of both cervix and uterus: Secondary | ICD-10-CM | POA: Insufficient documentation

## 2022-05-19 DIAGNOSIS — Z8542 Personal history of malignant neoplasm of other parts of uterus: Secondary | ICD-10-CM | POA: Diagnosis not present

## 2022-05-19 DIAGNOSIS — C541 Malignant neoplasm of endometrium: Secondary | ICD-10-CM

## 2022-05-19 NOTE — Patient Instructions (Addendum)
It was good to see you today.  I do not see or feel any evidence of cancer recurrence on your exam.  I will see you for follow-up in 12 months.  Please reach out to your OB/GYN (Dr. Elonda Husky) to schedule a follow-up visit in 6 months.  As always, if you develop any new and concerning symptoms before your next visit, please call to see me sooner.

## 2022-05-19 NOTE — Progress Notes (Signed)
Gynecologic Oncology Return Clinic Visit  05/19/22  Reason for Visit: Surveillance in the setting of endometrial cancer  Treatment History: Oncology History  Endometrial adenocarcinoma ([name])  10/28/2020 Initial Biopsy   EMB: Grade 1 endometrioid adenoca   10/28/2020 Initial Diagnosis   Endometrial adenocarcinoma ([name])   12/19/2020 Surgery   D&C: endometrioid adenoca, grade 1, with associated CAH  IHC MMR intact   07/02/2021 Pathology Results   EMB A. ENDOMETRIAL, BIOPSY:  - Endometrioid adenocarcinoma, FIGO grade 1, associated with complex  atypical hyperplasia.   10/02/2021 Pathology Results   EMB A. ENDOMETRIUM, BIOPSY:  Adenocarcinoma of the endometrium, endometrioid type, FIGO grade 1.  Progesterone induced changes in the endometrial stroma.  Please see comment.    10/21/2021 Surgery   TRH/BSO, SLN bx, oversew bladder serosa, mini-lap  Findings: On EUA, 12 cm mobile uterus. On intra-abdominal entry, normal upper abdominal survey. Normal omentum, small and large bowel. Enlarged 12 cm bulbous uterus. Normal appearing bilateral adnexa. Narrow pelvis with significant adiposity. Mapping successful to bilateral external iliac sentinel lymph nodes (both overlaying the external iliac vein). No gross adenopathy. No intra-abdominal or pelvic evidence of disease. Uterine specimen too large for transvaginal removal, requiring mini-lap for specimen delivery.   10/21/2021 Pathology Results   A.   SENTINEL LYMPH NODE, RIGHT EXTERNAL ILIAC, BIOPSY:  -    Lymph node negative for malignancy (0/1).   B.   SENTINEL LYMPH NODE, LEFT EXTERNAL ILIAC, BIOPSY:  -    Lymph node negative for malignancy (0/1).   C.   UTERUS, CERVIX, BILATERAL TUBES AND OVARIES:  -    Endometrial endometrioid carcinoma, FIGO grade 1, in a background  of endometrial intraepithelial neoplasia (EIN)/atypical endometrial  hyperplasia, with "MELF" (microcystic, elongated and fragmented) pattern  of myoinvasion limited  to inner half of myometrium, and with extensive  involvement of adenomyosis.   -    Cervix:             Negative for dysplasia.  -    Endometrium         Intrauterine device.  -    Myometrium:         Adenomyosis.  Two leiomyomas, largest 2.5 cm in greatest dimension.  -    Ovaries:       Senescent ovaries, negative for malignancy.  -    Fallopian tubes:    Negative for malignancy.   ONCOLOGY TABLE:   UTERUS, CARCINOMA OR CARCINOSARCOMA: Resection   Procedure: Total hysterectomy and bilateral salpingo-oophorectomy  Histologic Type: Endometrioid  Histologic Grade: FIGO Grade 1  Myometrial Invasion:       Depth of Myometrial Invasion (mm): 15 mm ("MELF") pattern       Myometrial Thickness (mm): 45 mm       Percentage of Myometrial Invasion: 33%  Uterine Serosa Involvement: Not identified  Cervical stromal Involvement: Not identified  Extent of involvement of other tissue/organs: Not identified  Peritoneal/Ascitic Fluid: Not submitted/unknown  Lymphovascular Invasion: Not identified  Regional Lymph Nodes: Regional lymph nodes present, all lymph nodes  negative for tumor cells       Pelvic Lymph Nodes Examined:      2 Sentinel                                0 Non-sentinel  2 Total       Pelvic Lymph Nodes with Metastasis: 0       Para-aortic Lymph Nodes Examined:  0  Distant Metastasis:       Distant Site(s) Involved: Not applicable    7/58/8325 Imaging   Pelvic ultrasound  Uterus                      12.2 x 9.3 x 9.7 cm, Total uterine volume 572 cc,enlarged homogeneous anteverted uterus,wnl   Endometrium          42 mm, symmetrical, homogeneous thickened endometrium with color flow   Right ovary             2.4 x 2.9 x 2.3 cm, wnl,limited view    Left ovary                3.2 x 2.4 x 2.6 cm, wnl,limited view    No free fluid     Interval History: Doing well.  Denies any lower pelvic pain.  Denies any vaginal bleeding or discharge.   Reports baseline bowel and bladder function.  Is continuing to use MiraLAX every night with good bowel function.  Had a flare of her sciatica over the summer.  Now has bilateral muscle soreness in her legs.  Has talked to her primary care provider about possibly doing some physical therapy.  Is using a cane to walk.  Past Medical/Surgical History: Past Medical History:  Diagnosis Date   Adenomatous colon polyp 2006       Arthritis    Atrial fibrillation ([place]) 2013   Depression    Dysrhythmia 2013   a-fib   Endometrial cancer ([name])    Fatty liver    ?cirrhosis on u/s in 2008   GERD (gastroesophageal reflux disease)    Headache    sinus   HTN (hypertension)    Hyperglycemia    Hyperlipidemia    NASH (nonalcoholic steatohepatitis)    Obesity    Peripheral vascular disease ([place]) 2003   Wt was 408 lbs   Pre-diabetes    Urinary incontinence    Vitamin D deficiency     Past Surgical History:  Procedure Laterality Date   COLONOSCOPY  04/09/2005   adenomatous polyps, two diverticula   COLONOSCOPY  12/2010   RMR: left-sided diverticula, adenomatous polyps, next TCS due 12/2015   DILATION AND CURETTAGE OF UTERUS  2015   DILATION AND CURETTAGE OF UTERUS N/A 12/19/2020   Procedure: POSSIBLE DILATATION AND CURETTAGE;  Surgeon: Lafonda Mosses, MD;  [facility];  Service: Gynecology;  Laterality: N/A;   ESOPHAGOGASTRODUODENOSCOPY  2006   hyperplastic polyps, hh   ESOPHAGOGASTRODUODENOSCOPY  12/2010   RMR:hh, multiple benign gastric polyps,    HYSTEROSCOPY  02/27/2014   Procedure: HYSTEROSCOPY;  Surgeon: Patty Kind, MD;  Location: [facility];  Service: Gynecology;;   INTRAUTERINE DEVICE (IUD) INSERTION N/A 12/19/2020   Procedure: POSSIBLE INTRAUTERINE DEVICE (IUD) INSERTION;  Surgeon: Lafonda Mosses, MD;  [facility];  Service: Gynecology;  Laterality: N/A;   LAPAROSCOPY N/A 12/19/2020   Procedure: Diagnostic Laparoscopy;  Surgeon: Lafonda Mosses, MD;   [facility];  Service: Gynecology;  Laterality: N/A;   POLYPECTOMY N/A 02/27/2014   Procedure: REMOVAL OF ENDOCERVICAL POLYP AND ENDOMETRIAL POLYPS;  Surgeon: Patty Kind, MD;  Location: [facility];  Service: Gynecology;  Laterality: N/A;   ROBOTIC ASSISTED TOTAL HYSTERECTOMY WITH BILATERAL SALPINGO OOPHERECTOMY Bilateral 10/21/2021   Procedure: XI ROBOTIC  ASSISTED TOTAL HYSTERECTOMY WITH BILATERAL SALPINGO OOPHORECTOMY, MINI-LAPAROTOMY;  Surgeon: Lafonda Mosses, MD;  [facility];  Service: Gynecology;  Laterality: Bilateral;   SENTINEL NODE BIOPSY N/A 10/21/2021   Procedure: SENTINEL NODE BIOPSY;  Surgeon: Lafonda Mosses, MD;  [facility];  Service: Gynecology;  Laterality: N/A;   TUBAL LIGATION     WISDOM TOOTH EXTRACTION      Family History  Problem Relation Age of Onset   Breast cancer Mother 89   Heart attack Father 3   Heart disease Brother    Heart attack Brother    Colon cancer Neg Hx    Liver disease Neg Hx    Uterine cancer Neg Hx    Ovarian cancer Neg Hx     Social History   Socioeconomic History   Marital status: Married    Spouse name: Not on file   Number of children: 2   Years of education: Not on file   Highest education level: Not on file  Occupational History   Occupation: retired  Tobacco Use   Smoking status: Former    Packs/day: 2.00    Years: 13.00    Total pack years: 26.00    Types: Cigarettes    Quit date: 07/06/1984    Years since quitting: 37.8   Smokeless tobacco: Never   Tobacco comments:    Quit since 1986  Vaping Use   Vaping Use: Never used  Substance and Sexual Activity   Alcohol use: Yes    Alcohol/week: 0.0 standard drinks of alcohol    Comment: maybe one or two drinks a year   Drug use: No   Sexual activity: Not Currently    Birth control/protection: Surgical, Post-menopausal  Other Topics Concern   Not on file  Social History Narrative   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   Social Determinants of  Health   Financial Resource Strain: Low Risk  (10/28/2020)   Overall Financial Resource Strain (CARDIA)    Difficulty of Paying Living Expenses: Not very hard  Food Insecurity: No Food Insecurity (10/28/2020)   Hunger Vital Sign    Worried About Running Out of Food in the Last Year: Never true    Ran Out of Food in the Last Year: Never true  Transportation Needs: No Transportation Needs (10/28/2020)   PRAPARE - Hydrologist (Medical): No    Lack of Transportation (Non-Medical): No  Physical Activity: Inactive (10/28/2020)   Exercise Vital Sign    Days of Exercise per Week: 0 days    Minutes of Exercise per Session: 0 min  Stress: No Stress Concern Present (10/28/2020)   [place]    Feeling of Stress : Only a little  Social Connections: Moderately Isolated (10/28/2020)   Social Connection and Isolation Panel [NHANES]    Frequency of Communication with Friends and Family: More than three times a week    Frequency of Social Gatherings with Friends and Family: Patient refused    Attends Religious Services: Never    Marine scientist or Organizations: No    Attends Music therapist: Never    Marital Status: Married    Current Medications:  Current Outpatient Medications:    acetaminophen (TYLENOL) 650 MG CR tablet, Take 1,300 mg by mouth every 8 (eight) hours as needed for pain., Disp: , Rfl:    atenolol (TENORMIN) 50 MG tablet, Take 50 mg by mouth at bedtime., Disp: ,  Rfl:    atorvastatin (LIPITOR) 40 MG tablet, Take 40 mg by mouth at bedtime., Disp: , Rfl:    benazepril (LOTENSIN) 20 MG tablet, Take 40 mg by mouth at bedtime., Disp: , Rfl:    diclofenac sodium (VOLTAREN) 1 % GEL, Apply 1 application topically 2 (two) times daily as needed (knee pain)., Disp: , Rfl: 5   methocarbamol (ROBAXIN) 500 MG tablet, Take 500 mg by mouth every 6 (six) hours as needed for muscle  spasms., Disp: , Rfl:    mirtazapine (REMERON) 15 MG tablet, Take 15 mg by mouth at bedtime., Disp: , Rfl:    OVER THE COUNTER MEDICATION, Take 2 each by mouth daily. Super Beet 2 chews daily, Disp: , Rfl:    polyethylene glycol powder (GLYCOLAX/MIRALAX) powder, MIX 1 CAPFUL IN LIQUID AT BEDTIME AS NEEDED FOR CONSTIPATION. (Patient taking differently: Take 17 g by mouth at bedtime.), Disp: 527 g, Rfl: 3   sodium-potassium bicarbonate (ALKA-SELTZER GOLD) TBEF dissolvable tablet, Take 2 tablets by mouth 2 (two) times daily as needed (heartburn)., Disp: , Rfl:   Review of Systems: Denies appetite changes, fevers, chills, fatigue, unexplained weight changes. Denies hearing loss, neck lumps or masses, mouth sores, ringing in ears or voice changes. Denies cough or wheezing.  Denies shortness of breath. Denies chest pain or palpitations. Denies leg swelling. Denies abdominal distention, pain, blood in stools, constipation, diarrhea, nausea, vomiting, or early satiety. Denies pain with intercourse, dysuria, frequency, hematuria or incontinence. Denies hot flashes, pelvic pain, vaginal bleeding or vaginal discharge.   Denies joint pain, back pain. Denies itching, rash, or wounds. Denies dizziness, headaches, numbness or seizures. Denies swollen lymph nodes or glands, denies easy bruising or bleeding. Denies anxiety, depression, confusion, or decreased concentration.  Physical Exam: BP (!) 148/61   Pulse 86   Temp 98.6 F (37 C) (Oral)   Resp 18   Ht 5' 5.35" (1.66 m)   Wt 252 lb 6.4 oz (114.5 kg)   SpO2 99%   BMI 41.55 kg/m  General: Alert, oriented, no acute distress. HEENT: Normocephalic, atraumatic, sclera anicteric. Chest: Clear to auscultation bilaterally.  No wheezes or rhonchi. Cardiovascular: Regular rate and rhythm, no murmurs. Abdomen: Obese, soft, nontender.  Normoactive bowel sounds.  No masses or hepatosplenomegaly appreciated.  Well-healed incisions. Extremities: Grossly  normal range of motion.  Warm, well perfused.  No edema bilaterally. Skin: No rashes or lesions noted. Lymphatics: No cervical, supraclavicular, or inguinal adenopathy. GU: Normal appearing external genitalia without erythema, excoriation, or lesions.  Speculum exam reveals mildly atrophic vaginal mucosa, no lesions noted.  No blood or discharge within the vaginal vault, cuff intact.  Bimanual exam reveals intact, no nodularity or masses palpated.  Rectovaginal exam confirms findings.  Laboratory & Radiologic Studies: None new  Assessment & Plan: Patty Estrada is a 71 y.o. woman with Stage IA low risk grade 1 endometrial endometrioid adenocarcinoma who presents for surveillance. Definitive surgery was in 10/2021. MMR intact from [facility] specimen in 12/2020. No adjuvant treatment given low risk disease.   Patient is overall doing well and is NED on exam today.   Per NCCN surveillance recommendations, we discussed surveillance visits every 6 months.  I would recommend that we alternate these between my office and her OB/GYN.  I asked her to call her OB/GYN, Dr. Elonda Husky, to schedule a visit in May.  She will call my office over the summer to schedule a visit to see me in November of next year.  Discussed signs and symptoms  that would be concerning for disease recurrence, and I stressed the importance of calling if she develops any of these.  20 minutes of total time was spent for this patient encounter, including preparation, face-to-face counseling with the patient and coordination of care, and documentation of the encounter.  Jeral Pinch, MD  Division of Gynecologic Oncology  Department of Obstetrics and Gynecology  [facility]

## 2022-08-07 DIAGNOSIS — E118 Type 2 diabetes mellitus with unspecified complications: Secondary | ICD-10-CM | POA: Diagnosis not present

## 2022-08-07 DIAGNOSIS — I10 Essential (primary) hypertension: Secondary | ICD-10-CM | POA: Diagnosis not present

## 2022-08-07 DIAGNOSIS — Z79899 Other long term (current) drug therapy: Secondary | ICD-10-CM | POA: Diagnosis not present

## 2022-08-14 DIAGNOSIS — I480 Paroxysmal atrial fibrillation: Secondary | ICD-10-CM | POA: Diagnosis not present

## 2022-08-14 DIAGNOSIS — R7309 Other abnormal glucose: Secondary | ICD-10-CM | POA: Diagnosis not present

## 2022-08-14 DIAGNOSIS — I10 Essential (primary) hypertension: Secondary | ICD-10-CM | POA: Diagnosis not present

## 2022-08-14 DIAGNOSIS — E1169 Type 2 diabetes mellitus with other specified complication: Secondary | ICD-10-CM | POA: Diagnosis not present

## 2022-11-06 DIAGNOSIS — E210 Primary hyperparathyroidism: Secondary | ICD-10-CM | POA: Diagnosis not present

## 2022-11-06 DIAGNOSIS — E118 Type 2 diabetes mellitus with unspecified complications: Secondary | ICD-10-CM | POA: Diagnosis not present

## 2022-11-09 ENCOUNTER — Ambulatory Visit: Payer: Medicare Other | Admitting: Obstetrics & Gynecology

## 2022-11-09 ENCOUNTER — Encounter: Payer: Self-pay | Admitting: Obstetrics & Gynecology

## 2022-11-09 VITALS — BP 138/67 | HR 82 | Ht 65.0 in | Wt 279.0 lb

## 2022-11-09 DIAGNOSIS — C541 Malignant neoplasm of endometrium: Secondary | ICD-10-CM | POA: Diagnosis not present

## 2022-11-09 NOTE — Progress Notes (Signed)
Subjective:     Surveillance for endometrial cancer:    Patty Estrada is a 72 y.o. female here for a routine exam.  No LMP recorded. Patient has had a hysterectomy. [id number] Birth Control Method:  hysterectomy Menstrual Calendar(currently): na  Current complaints: weight/legs.   Current acute medical issues:  Gr 1 Stage 1 endometrial adenocarcinoma   Recent Gynecologic History No LMP recorded. Patient has had a hysterectomy. Last Pap: ,     Past Medical History:  Diagnosis Date   Adenomatous colon polyp 2006       Arthritis    Atrial fibrillation (HCC) 2013   Depression    Dysrhythmia 2013   a-fib   Endometrial cancer (HCC)    Fatty liver    ?cirrhosis on u/s in 2008   GERD (gastroesophageal reflux disease)    Headache    sinus   HTN (hypertension)    Hyperglycemia    Hyperlipidemia    NASH (nonalcoholic steatohepatitis)    Obesity    Peripheral vascular disease (HCC) 2003   Wt was 408 lbs   Pre-diabetes    Urinary incontinence    Vitamin D deficiency     Past Surgical History:  Procedure Laterality Date   COLONOSCOPY  04/09/2005   adenomatous polyps, two diverticula   COLONOSCOPY  12/2010   RMR: left-sided diverticula, adenomatous polyps, next TCS due 12/2015   DILATION AND CURETTAGE OF UTERUS  2015   DILATION AND CURETTAGE OF UTERUS N/A 12/19/2020   Procedure: POSSIBLE DILATATION AND CURETTAGE;  Surgeon: Carver Fila, MD;  Location: [facility];  Service: Gynecology;  Laterality: N/A;   ESOPHAGOGASTRODUODENOSCOPY  2006   hyperplastic polyps, hh   ESOPHAGOGASTRODUODENOSCOPY  12/2010   RMR:hh, multiple benign gastric polyps,    HYSTEROSCOPY  02/27/2014   Procedure: HYSTEROSCOPY;  Surgeon: Tilda Burrow, MD;  Location: [facility];  Service: Gynecology;;   INTRAUTERINE DEVICE (IUD) INSERTION N/A 12/19/2020   Procedure: POSSIBLE INTRAUTERINE DEVICE (IUD) INSERTION;  Surgeon: Carver Fila, MD;  [facility]: [facility];  Service: Gynecology;  Laterality:  N/A;   LAPAROSCOPY N/A 12/19/2020   Procedure: Diagnostic Laparoscopy;  Surgeon: Carver Fila, MD;  Location: [facility];  Service: Gynecology;  Laterality: N/A;   POLYPECTOMY N/A 02/27/2014   Procedure: REMOVAL OF ENDOCERVICAL POLYP AND ENDOMETRIAL POLYPS;  Surgeon: Tilda Burrow, MD;  Location: [facility];  Service: Gynecology;  Laterality: N/A;   ROBOTIC ASSISTED TOTAL HYSTERECTOMY WITH BILATERAL SALPINGO OOPHERECTOMY Bilateral 10/21/2021   Procedure: XI ROBOTIC ASSISTED TOTAL HYSTERECTOMY WITH BILATERAL SALPINGO OOPHORECTOMY, MINI-LAPAROTOMY;  Surgeon: Carver Fila, MD;  Location: [facility];  Service: Gynecology;  Laterality: Bilateral;   SENTINEL NODE BIOPSY N/A 10/21/2021   Procedure: SENTINEL NODE BIOPSY;  Surgeon: Carver Fila, MD;  Location: [facility];  Service: Gynecology;  Laterality: N/A;   TUBAL LIGATION     WISDOM TOOTH EXTRACTION      OB History     Gravida  2   Para  2   Term  2   Preterm  0   AB  0   Living  2      SAB  0   IAB  0   Ectopic  0   Multiple  0   Live Births  2           Social History   Socioeconomic History   Marital status: Married    Spouse name: Not on file   Number of children: 2   Years of  education: Not on file   Highest education level: Not on file  Occupational History   Occupation: retired  Tobacco Use   Smoking status: Former    Packs/day: 2.00    Years: 13.00    Additional pack years: 0.00    Total pack years: 26.00    Types: Cigarettes    Quit date: 07/06/1984    Years since quitting: 38.3   Smokeless tobacco: Never   Tobacco comments:    Quit since 1986  Vaping Use   Vaping Use: Never used  Substance and Sexual Activity   Alcohol use: Yes    Alcohol/week: 0.0 standard drinks of alcohol    Comment: maybe one or two drinks a year   Drug use: No   Sexual activity: Not Currently    Birth control/protection: Surgical, Post-menopausal  Other Topics Concern   Not on file  Social History Narrative    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   Social Determinants of Health   Financial Resource Strain: Low Risk  (10/28/2020)   Overall Financial Resource Strain (CARDIA)    Difficulty of Paying Living Expenses: Not very hard  Food Insecurity: No Food Insecurity (10/28/2020)   Hunger Vital Sign    Worried About Running Out of Food in the Last Year: Never true    Ran Out of Food in the Last Year: Never true  Transportation Needs: No Transportation Needs (10/28/2020)   PRAPARE - Administrator, Civil Service (Medical): No    Lack of Transportation (Non-Medical): No  Physical Activity: Inactive (10/28/2020)   Exercise Vital Sign    Days of Exercise per Week: 0 days    Minutes of Exercise per Session: 0 min  Stress: No Stress Concern Present (10/28/2020)   [employer] of Occupational Health - Occupational Stress Questionnaire    Feeling of Stress : Only a little  Social Connections: Moderately Isolated (10/28/2020)   Social Connection and Isolation Panel [NHANES]    Frequency of Communication with Friends and Family: More than three times a week    Frequency of Social Gatherings with Friends and Family: Patient declined    Attends Religious Services: Never    Database administrator or Organizations: No    Attends Engineer, structural: Never    Marital Status: Married    Family History  Problem Relation Age of Onset   Breast cancer Mother 40   Heart attack Father 89   Heart disease Brother    Heart attack Brother    Colon cancer Neg Hx    Liver disease Neg Hx    Uterine cancer Neg Hx    Ovarian cancer Neg Hx      Current Outpatient Medications:    acetaminophen (TYLENOL) 650 MG CR tablet, Take 1,300 mg by mouth every 8 (eight) hours as needed for pain., Disp: , Rfl:    atenolol (TENORMIN) 50 MG tablet, Take 50 mg by mouth at bedtime., Disp: , Rfl:    atorvastatin (LIPITOR) 40 MG tablet, Take 40 mg by mouth at bedtime., Disp: , Rfl:    benazepril  (LOTENSIN) 20 MG tablet, Take 40 mg by mouth at bedtime., Disp: , Rfl:    diclofenac sodium (VOLTAREN) 1 % GEL, Apply 1 application topically 2 (two) times daily as needed (knee pain)., Disp: , Rfl: 5   methocarbamol (ROBAXIN) 500 MG tablet, Take 500 mg by mouth every 6 (six) hours as needed for muscle spasms., Disp: , Rfl:    mirtazapine (REMERON)  15 MG tablet, Take 15 mg by mouth at bedtime., Disp: , Rfl:    polyethylene glycol powder (GLYCOLAX/MIRALAX) powder, MIX 1 CAPFUL IN LIQUID AT BEDTIME AS NEEDED FOR CONSTIPATION. (Patient taking differently: Take 17 g by mouth at bedtime.), Disp: 527 g, Rfl: 3   sodium-potassium bicarbonate (ALKA-SELTZER GOLD) TBEF dissolvable tablet, Take 2 tablets by mouth 2 (two) times daily as needed (heartburn)., Disp: , Rfl:   Review of Systems  Review of Systems  Constitutional: Negative for fever, chills, weight loss, malaise/fatigue and diaphoresis.  HENT: Negative for hearing loss, ear pain, nosebleeds, congestion, sore throat, neck pain, tinnitus and ear discharge.   Eyes: Negative for blurred vision, double vision, photophobia, pain, discharge and redness.  Respiratory: Negative for cough, hemoptysis, sputum production, shortness of breath, wheezing and stridor.   Cardiovascular: Negative for chest pain, palpitations, orthopnea, claudication, leg swelling and PND.  Gastrointestinal: negative for abdominal pain. Negative for heartburn, nausea, vomiting, diarrhea, constipation, blood in stool and melena.  Genitourinary: Negative for dysuria, urgency, frequency, hematuria and flank pain.  Musculoskeletal: Negative for myalgias, back pain, joint pain and falls.  Skin: Negative for itching and rash.  Neurological: Negative for dizziness, tingling, tremors, sensory change, speech change, focal weakness, seizures, loss of consciousness, weakness and headaches.  Endo/Heme/Allergies: Negative for environmental allergies and polydipsia. Does not bruise/bleed easily.   Psychiatric/Behavioral: Negative for depression, suicidal ideas, hallucinations, memory loss and substance abuse. The patient is not nervous/anxious and does not have insomnia.        Objective:  Blood pressure 138/67, pulse 82, height 5\' 5"  (1.651 m), weight 279 lb (126.6 kg).   Physical Exam  Vitals reviewed. Constitutional: She is oriented to person, place, and time. She appears well-developed and well-nourished.  HENT:  Head: Normocephalic and atraumatic.        Right Ear: External ear normal.  Left Ear: External ear normal.  Nose: Nose normal.  Mouth/Throat: Oropharynx is clear and moist.  Eyes: Conjunctivae and EOM are normal. Pupils are equal, round, and reactive to light. Right eye exhibits no discharge. Left eye exhibits no discharge. No scleral icterus.  Neck: Normal range of motion. Neck supple. No tracheal deviation present. No thyromegaly present.  Cardiovascular: Normal rate, regular rhythm, normal heart sounds and intact distal pulses.  Exam reveals no gallop and no friction rub.   No murmur heard. Respiratory: Effort normal and breath sounds normal. No respiratory distress. She has no wheezes. She has no rales. She exhibits no tenderness.  GI: Soft. Bowel sounds are normal. She exhibits no distension and no mass. There is no tenderness. There is no rebound and no guarding.  Genitourinary:  Breasts no masses skin changes or nipple changes bilaterally      Vulva is normal without lesions Vagina is pink moist without discharge, cuff intact no lesions Cervix absent and pap is not done Uterus is normal size shape and contour Adnexa is negative   Musculoskeletal: Normal range of motion. She exhibits no edema and no tenderness.  Neurological: She is alert and oriented to person, place, and time. She has normal reflexes. She displays normal reflexes. No cranial nerve deficit. She exhibits normal muscle tone. Coordination normal.  Skin: Skin is warm and dry. No rash noted. No  erythema. No pallor.  Psychiatric: She has a normal mood and affect. Her behavior is normal. Judgment and thought content normal.       Medications Ordered at today's visit: No orders of the defined types were placed in this  encounter.   Other orders placed at today's visit: No orders of the defined types were placed in this encounter.     Assessment:    Stage 1A Grade 1 endometrial adenocarcinoma 10/2020--> S/P definitive surgical management 12/2021    Plan:    NED on exam Follow up here 1 year(DR Pricilla Holm 6 months)     Return in about 1 year (around 11/09/2023) for Follow up, with Dr Despina Hidden.

## 2022-12-21 DIAGNOSIS — E210 Primary hyperparathyroidism: Secondary | ICD-10-CM | POA: Diagnosis not present

## 2022-12-21 DIAGNOSIS — E1169 Type 2 diabetes mellitus with other specified complication: Secondary | ICD-10-CM | POA: Diagnosis not present

## 2022-12-21 DIAGNOSIS — R7309 Other abnormal glucose: Secondary | ICD-10-CM | POA: Diagnosis not present

## 2023-03-03 DIAGNOSIS — H5211 Myopia, right eye: Secondary | ICD-10-CM | POA: Diagnosis not present

## 2023-03-03 DIAGNOSIS — H524 Presbyopia: Secondary | ICD-10-CM | POA: Diagnosis not present

## 2023-03-11 ENCOUNTER — Telehealth: Payer: Self-pay | Admitting: *Deleted

## 2023-03-11 NOTE — Telephone Encounter (Signed)
Spoke with Ms. Garde who called the office to schedule a 6 month follow up with Dr. Pricilla Holm. Pt was given an appointment for Thursday, November 14 th. At 3:15 pm, pt agreed to date and time.

## 2023-04-19 DIAGNOSIS — F419 Anxiety disorder, unspecified: Secondary | ICD-10-CM | POA: Diagnosis not present

## 2023-04-19 DIAGNOSIS — R202 Paresthesia of skin: Secondary | ICD-10-CM | POA: Diagnosis not present

## 2023-04-19 DIAGNOSIS — E118 Type 2 diabetes mellitus with unspecified complications: Secondary | ICD-10-CM | POA: Diagnosis not present

## 2023-04-19 DIAGNOSIS — Z79899 Other long term (current) drug therapy: Secondary | ICD-10-CM | POA: Diagnosis not present

## 2023-04-26 DIAGNOSIS — E1169 Type 2 diabetes mellitus with other specified complication: Secondary | ICD-10-CM | POA: Diagnosis not present

## 2023-04-26 DIAGNOSIS — Z23 Encounter for immunization: Secondary | ICD-10-CM | POA: Diagnosis not present

## 2023-05-20 ENCOUNTER — Inpatient Hospital Stay: Payer: Medicare Other | Attending: Gynecologic Oncology | Admitting: Gynecologic Oncology

## 2023-05-20 ENCOUNTER — Encounter: Payer: Self-pay | Admitting: Gynecologic Oncology

## 2023-05-20 VITALS — BP 162/68 | HR 91 | Temp 98.1°F | Resp 20 | Wt 290.2 lb

## 2023-05-20 DIAGNOSIS — Z90710 Acquired absence of both cervix and uterus: Secondary | ICD-10-CM | POA: Diagnosis not present

## 2023-05-20 DIAGNOSIS — Z9079 Acquired absence of other genital organ(s): Secondary | ICD-10-CM | POA: Insufficient documentation

## 2023-05-20 DIAGNOSIS — Z8542 Personal history of malignant neoplasm of other parts of uterus: Secondary | ICD-10-CM

## 2023-05-20 DIAGNOSIS — Z90722 Acquired absence of ovaries, bilateral: Secondary | ICD-10-CM | POA: Diagnosis not present

## 2023-05-20 DIAGNOSIS — C541 Malignant neoplasm of endometrium: Secondary | ICD-10-CM

## 2023-05-20 NOTE — Patient Instructions (Signed)
It was good to see you today.  I do not see or feel any evidence of cancer recurrence on your exam.  Please reach out to schedule follow-up with your OB/GYN in May.  I would like to see you back in 1 year.  Please call my office over the summer to schedule visit to see me in November 2025.  As always, if you develop any new and concerning symptoms before your next visit, please call to see me sooner.

## 2023-05-20 NOTE — Progress Notes (Signed)
Gynecologic Oncology Return Clinic Visit  05/20/23  Reason for Visit: Surveillance in the setting of endometrial cancer   Treatment History: Oncology History  Endometrial adenocarcinoma (HCC)  10/28/2020 Initial Biopsy   EMB: Grade 1 endometrioid adenoca   10/28/2020 Initial Diagnosis   Endometrial adenocarcinoma (HCC)   12/19/2020 Surgery   D&C: endometrioid adenoca, grade 1, with associated CAH  IHC MMR intact   07/02/2021 Pathology Results   EMB A. ENDOMETRIAL, BIOPSY:  - Endometrioid adenocarcinoma, FIGO grade 1, associated with complex  atypical hyperplasia.   10/02/2021 Pathology Results   EMB A. ENDOMETRIUM, BIOPSY:  Adenocarcinoma of the endometrium, endometrioid type, FIGO grade 1.  Progesterone induced changes in the endometrial stroma.  Please see comment.    10/21/2021 Surgery   TRH/BSO, SLN bx, oversew bladder serosa, mini-lap  Findings: On EUA, 12 cm mobile uterus. On intra-abdominal entry, normal upper abdominal survey. Normal omentum, small and large bowel. Enlarged 12 cm bulbous uterus. Normal appearing bilateral adnexa. Narrow pelvis with significant adiposity. Mapping successful to bilateral external iliac sentinel lymph nodes (both overlaying the external iliac vein). No gross adenopathy. No intra-abdominal or pelvic evidence of disease. Uterine specimen too large for transvaginal removal, requiring mini-lap for specimen delivery.   10/21/2021 Pathology Results   A.   SENTINEL LYMPH NODE, RIGHT EXTERNAL ILIAC, BIOPSY:  -    Lymph node negative for malignancy (0/1).   B.   SENTINEL LYMPH NODE, LEFT EXTERNAL ILIAC, BIOPSY:  -    Lymph node negative for malignancy (0/1).   C.   UTERUS, CERVIX, BILATERAL TUBES AND OVARIES:  -    Endometrial endometrioid carcinoma, FIGO grade 1, in a background  of endometrial intraepithelial neoplasia (EIN)/atypical endometrial  hyperplasia, with "MELF" (microcystic, elongated and fragmented) pattern  of myoinvasion limited  to inner half of myometrium, and with extensive  involvement of adenomyosis.   -    Cervix:             Negative for dysplasia.  -    Endometrium         Intrauterine device.  -    Myometrium:         Adenomyosis.  Two leiomyomas, largest 2.5 cm in greatest dimension.  -    Ovaries:       Senescent ovaries, negative for malignancy.  -    Fallopian tubes:    Negative for malignancy.   ONCOLOGY TABLE:   UTERUS, CARCINOMA OR CARCINOSARCOMA: Resection   Procedure: Total hysterectomy and bilateral salpingo-oophorectomy  Histologic Type: Endometrioid  Histologic Grade: FIGO Grade 1  Myometrial Invasion:       Depth of Myometrial Invasion (mm): 15 mm ("MELF") pattern       Myometrial Thickness (mm): 45 mm       Percentage of Myometrial Invasion: 33%  Uterine Serosa Involvement: Not identified  Cervical stromal Involvement: Not identified  Extent of involvement of other tissue/organs: Not identified  Peritoneal/Ascitic Fluid: Not submitted/unknown  Lymphovascular Invasion: Not identified  Regional Lymph Nodes: Regional lymph nodes present, all lymph nodes  negative for tumor cells       Pelvic Lymph Nodes Examined:      2 Sentinel                                0 Non-sentinel  2 Total       Pelvic Lymph Nodes with Metastasis: 0       Para-aortic Lymph Nodes Examined:  0  Distant Metastasis:       Distant Site(s) Involved: Not applicable    11/18/2021 Imaging   Pelvic ultrasound  Uterus                      12.2 x 9.3 x 9.7 cm, Total uterine volume 572 cc,enlarged homogeneous anteverted uterus,wnl   Endometrium          42 mm, symmetrical, homogeneous thickened endometrium with color flow   Right ovary             2.4 x 2.9 x 2.3 cm, wnl,limited view    Left ovary                3.2 x 2.4 x 2.6 cm, wnl,limited view    No free fluid     Interval History: Doing well.  Denies any vaginal bleeding or discharge.  Struggling some with constipation  after recently starting Ozempic.  Denies any changes to urinary function.  Had been struggling significantly with increased appetite and weight gain.  Started Ozempic about 3 weeks ago.  Now having some nausea, decreased appetite.  Past Medical/Surgical History: Past Medical History:  Diagnosis Date   Adenomatous colon polyp 2006       Arthritis    Atrial fibrillation (HCC) 2013   Depression    Dysrhythmia 2013   a-fib   Endometrial cancer (HCC)    Fatty liver    ?cirrhosis on u/s in 2008   GERD (gastroesophageal reflux disease)    Headache    sinus   HTN (hypertension)    Hyperglycemia    Hyperlipidemia    NASH (nonalcoholic steatohepatitis)    Obesity    Peripheral vascular disease (HCC) 2003   Wt was 408 lbs   Pre-diabetes    Urinary incontinence    Vitamin D deficiency     Past Surgical History:  Procedure Laterality Date   COLONOSCOPY  04/09/2005   adenomatous polyps, two diverticula   COLONOSCOPY  12/2010   RMR: left-sided diverticula, adenomatous polyps, next TCS due 12/2015   DILATION AND CURETTAGE OF UTERUS  2015   DILATION AND CURETTAGE OF UTERUS N/A 12/19/2020   Procedure: POSSIBLE DILATATION AND CURETTAGE;  Surgeon: Carver Fila, MD;  Location: [facility];  Service: Gynecology;  Laterality: N/A;   ESOPHAGOGASTRODUODENOSCOPY  2006   hyperplastic polyps, hh   ESOPHAGOGASTRODUODENOSCOPY  12/2010   RMR:hh, multiple benign gastric polyps,    HYSTEROSCOPY  02/27/2014   Procedure: HYSTEROSCOPY;  Surgeon: Tilda Burrow, MD;  Location: [facility];  Service: Gynecology;;   INTRAUTERINE DEVICE (IUD) INSERTION N/A 12/19/2020   Procedure: POSSIBLE INTRAUTERINE DEVICE (IUD) INSERTION;  Surgeon: Carver Fila, MD;  [facility];  Service: Gynecology;  Laterality: N/A;   LAPAROSCOPY N/A 12/19/2020   Procedure: Diagnostic Laparoscopy;  Surgeon: Carver Fila, MD;  Location: [facility];  Service: Gynecology;  Laterality: N/A;   POLYPECTOMY N/A 02/27/2014    Procedure: REMOVAL OF ENDOCERVICAL POLYP AND ENDOMETRIAL POLYPS;  Surgeon: Tilda Burrow, MD;  Location: [facility];  Service: Gynecology;  Laterality: N/A;   ROBOTIC ASSISTED TOTAL HYSTERECTOMY WITH BILATERAL SALPINGO OOPHERECTOMY Bilateral 10/21/2021   Procedure: XI ROBOTIC ASSISTED TOTAL HYSTERECTOMY WITH BILATERAL SALPINGO OOPHORECTOMY, MINI-LAPAROTOMY;  Surgeon: Carver Fila, MD;  Location[facility];  Service: Gynecology;  Laterality: Bilateral;  SENTINEL NODE BIOPSY N/A 10/21/2021   Procedure: SENTINEL NODE BIOPSY;  Surgeon: Carver Fila, MD;  Location: [facility];  Service: Gynecology;  Laterality: N/A;   TUBAL LIGATION     WISDOM TOOTH EXTRACTION      Family History  Problem Relation Age of Onset   Breast cancer Mother 2   Heart attack Father 46   Heart disease Brother    Heart attack Brother    Colon cancer Neg Hx    Liver disease Neg Hx    Uterine cancer Neg Hx    Ovarian cancer Neg Hx     Social History   Socioeconomic History   Marital status: Married    Spouse name: Not on file   Number of children: 2   Years of education: Not on file   Highest education level: Not on file  Occupational History   Occupation: retired  Tobacco Use   Smoking status: Former    Current packs/day: 0.00    Average packs/day: 2.0 packs/day for 13.0 years (26.0 ttl pk-yrs)    Types: Cigarettes    Start date: 07/07/1971    Quit date: 07/06/1984    Years since quitting: 38.8   Smokeless tobacco: Never   Tobacco comments:    Quit since 1986  Vaping Use   Vaping status: Never Used  Substance and Sexual Activity   Alcohol use: Yes    Alcohol/week: 0.0 standard drinks of alcohol    Comment: maybe one or two drinks a year   Drug use: No   Sexual activity: Not Currently    Birth control/protection: Surgical, Post-menopausal  Other Topics Concern   Not on file  Social History Narrative   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   Social Determinants of Health   Financial Resource  Strain: Low Risk  (10/28/2020)   Overall Financial Resource Strain (CARDIA)    Difficulty of Paying Living Expenses: Not very hard  Food Insecurity: No Food Insecurity (10/28/2020)   Hunger Vital Sign    Worried About Running Out of Food in the Last Year: Never true    Ran Out of Food in the Last Year: Never true  Transportation Needs: No Transportation Needs (10/28/2020)   PRAPARE - Administrator, Civil Service (Medical): No    Lack of Transportation (Non-Medical): No  Physical Activity: Inactive (10/28/2020)   Exercise Vital Sign    Days of Exercise per Week: 0 days    Minutes of Exercise per Session: 0 min  Stress: No Stress Concern Present (10/28/2020)   [employer] of Occupational Health - Occupational Stress Questionnaire    Feeling of Stress : Only a little  Social Connections: Moderately Isolated (10/28/2020)   Social Connection and Isolation Panel [NHANES]    Frequency of Communication with Friends and Family: More than three times a week    Frequency of Social Gatherings with Friends and Family: Patient declined    Attends Religious Services: Never    Database administrator or Organizations: No    Attends Engineer, structural: Never    Marital Status: Married    Current Medications:  Current Outpatient Medications:    acetaminophen (TYLENOL) 650 MG CR tablet, Take 1,300 mg by mouth every 8 (eight) hours as needed for pain., Disp: , Rfl:    atenolol (TENORMIN) 50 MG tablet, Take 50 mg by mouth at bedtime., Disp: , Rfl:    atorvastatin (LIPITOR) 40 MG tablet, Take 40 mg by mouth at bedtime., Disp: ,  Rfl:    benazepril (LOTENSIN) 20 MG tablet, Take 40 mg by mouth at bedtime., Disp: , Rfl:    diclofenac sodium (VOLTAREN) 1 % GEL, Apply 1 application topically 2 (two) times daily as needed (knee pain)., Disp: , Rfl: 5   methocarbamol (ROBAXIN) 500 MG tablet, Take 500 mg by mouth every 6 (six) hours as needed for muscle spasms., Disp: , Rfl:     mirtazapine (REMERON) 15 MG tablet, Take 15 mg by mouth at bedtime., Disp: , Rfl:    polyethylene glycol powder (GLYCOLAX/MIRALAX) powder, MIX 1 CAPFUL IN LIQUID AT BEDTIME AS NEEDED FOR CONSTIPATION. (Patient taking differently: Take 17 g by mouth at bedtime.), Disp: 527 g, Rfl: 3   Semaglutide,0.25 or 0.5MG /DOS, (OZEMPIC, 0.25 OR 0.5 MG/DOSE,) 2 MG/1.5ML SOPN, Inject into the skin., Disp: , Rfl:    sodium-potassium bicarbonate (ALKA-SELTZER GOLD) TBEF dissolvable tablet, Take 2 tablets by mouth 2 (two) times daily as needed (heartburn)., Disp: , Rfl:   Review of Systems: + Appetite changes, joint pain Denies fevers, chills, fatigue, unexplained weight changes. Denies hearing loss, neck lumps or masses, mouth sores, ringing in ears or voice changes. Denies cough or wheezing.  Denies shortness of breath. Denies chest pain or palpitations. Denies leg swelling. Denies abdominal distention, pain, blood in stools, constipation, diarrhea, nausea, vomiting, or early satiety. Denies pain with intercourse, dysuria, frequency, hematuria or incontinence. Denies hot flashes, pelvic pain, vaginal bleeding or vaginal discharge.   Denies back pain or muscle pain/cramps. Denies itching, rash, or wounds. Denies dizziness, headaches, numbness or seizures. Denies swollen lymph nodes or glands, denies easy bruising or bleeding. Denies anxiety, depression, confusion, or decreased concentration.  Physical Exam: BP (!) 159/53 (Patient Position: Sitting)   Pulse 91   Temp 98.1 F (36.7 C) (Oral)   Resp 20   Wt 290 lb 3.2 oz (131.6 kg)   BMI 48.29 kg/m  General: Alert, oriented, no acute distress. HEENT: Normocephalic, atraumatic, sclera anicteric. Chest: Clear to auscultation bilaterally.  No wheezes or rhonchi. Cardiovascular: Regular rate and rhythm, no murmurs. Abdomen: Obese, soft, nontender.  Normoactive bowel sounds.  No masses or hepatosplenomegaly appreciated.  Well-healed incisions. Extremities:  Grossly normal range of motion.  Warm, well perfused.  No edema bilaterally. Skin: No rashes or lesions noted. Lymphatics: No cervical, supraclavicular, or inguinal adenopathy. GU: Normal appearing external genitalia without erythema, excoriation, or lesions.  Speculum exam reveals mildly atrophic vaginal mucosa, no lesions noted.  No blood or discharge within the vaginal vault, cuff intact.  Bimanual exam reveals intact, no nodularity or masses palpated.  Rectovaginal exam confirms findings.  Laboratory & Radiologic Studies: None new  Assessment & Plan: Rosalinde Malinowski is a 72 y.o. woman with Stage IA2 grade 1 endometrioid endometrial adenocarcinoma who presents for surveillance. Definitive surgery was in 10/2021. MMR intact from [facility] specimen in 12/2020. No adjuvant treatment given low risk disease.   Patient is overall doing well and is NED on exam today.   Per NCCN surveillance recommendations, we will continue with surveillance visits every 6 months alternating between my office and her OB/GYN.  I asked her to call her OB/GYN, Dr. Despina Hidden, to schedule a visit in May.  She will call my office over the summer to schedule a visit to see me in November of next year.   Discussed signs and symptoms that would be concerning for disease recurrence, and I stressed the importance of calling if she develops any of these.  20 minutes of total time was spent  for this patient encounter, including preparation, face-to-face counseling with the patient and coordination of care, and documentation of the encounter.  Eugene Garnet, MD  Division of Gynecologic Oncology  Department of Obstetrics and Gynecology  [facility]

## 2023-08-03 DIAGNOSIS — E118 Type 2 diabetes mellitus with unspecified complications: Secondary | ICD-10-CM | POA: Diagnosis not present

## 2023-08-10 DIAGNOSIS — E1169 Type 2 diabetes mellitus with other specified complication: Secondary | ICD-10-CM | POA: Diagnosis not present

## 2023-10-22 ENCOUNTER — Telehealth: Admitting: Family Medicine

## 2023-10-22 DIAGNOSIS — A084 Viral intestinal infection, unspecified: Secondary | ICD-10-CM

## 2023-10-22 MED ORDER — ONDANSETRON HCL 4 MG PO TABS: 4.0000 mg | ORAL_TABLET | Freq: Three times a day (TID) | ORAL | 0 refills | Status: DC | PRN

## 2023-10-22 NOTE — Patient Instructions (Signed)
 Viral Gastroenteritis, Adult  Viral gastroenteritis is also known as the stomach flu. This condition may affect your stomach, small intestine, and large intestine. It can cause sudden watery diarrhea, fever, and vomiting. This condition is caused by many

## 2023-10-22 NOTE — Progress Notes (Signed)
 Virtual Visit Consent   Patty Estrada, you are scheduled for a virtual visit with a [facility] provider today. Just as with appointments in the office, your consent must be obtained to participate. Your consent will be active for this visit and any virtual visit you may have with one of our providers in the next 365 days. If you have a MyChart account, a copy of this consent can be sent to you electronically.  As this is a virtual visit, video technology does not allow for your provider to perform a traditional examination. This may limit your provider's ability to fully assess your condition. If your provider identifies any concerns that need to be evaluated in person or the need to arrange testing (such as labs, EKG, etc.), we will make arrangements to do so. Although advances in technology are sophisticated, we cannot ensure that it will always work on either your end or our end. If the connection with a video visit is poor, the visit may have to be switched to a telephone visit. With either a video or telephone visit, we are not always able to ensure that we have a secure connection.  By engaging in this virtual visit, you consent to the provision of healthcare and authorize for your insurance to be billed (if applicable) for the services provided during this visit. Depending on your insurance coverage, you may receive a charge related to this service.  I need to obtain your verbal consent now. Are you willing to proceed with your visit today? Patty Estrada has provided verbal consent on 10/22/2023 for a virtual visit (video or telephone). Albertha Huger, FNP  Date: 10/22/2023 10:25 AM   Virtual Visit via Video Note   I, Albertha Huger, connected with  Patty Estrada  ([id number], May 11, 1951) on 10/22/23 at 10:15 AM EDT by a video-enabled telemedicine application and verified that I am speaking with the correct person using two identifiers.  Location: Patient: Virtual Visit Location  Patient: Home Provider: Virtual Visit Location Provider: Home Office   I discussed the limitations of evaluation and management by telemedicine and the availability of in person appointments. The patient expressed understanding and agreed to proceed.    History of Present Illness: Patty Estrada is a 73 y.o. who identifies as a female who was assigned female at birth, and is being seen today for nausea vomiting and diarrhea for 1 day. She is staying hydrated. No fever. No severe abd pain. She is on wegovy. But has not had any problems with it. Aaron Aas  HPI: HPI  Problems:  Patient Active Problem List   Diagnosis Date Noted   Endometrial cancer (HCC) 10/21/2021   Endometrial adenocarcinoma (HCC) 11/26/2020   Pre-op evaluation 02/19/2014   Morbidly obese (HCC) 02/19/2014   Rectocele 02/15/2014   Cervical polyp 02/15/2014   Constipation 11/22/2013   Sinusitis, acute 11/22/2013   Hemorrhoids 09/08/2012   LUQ pain 09/30/2010   GASTRIC POLYP 02/28/2008   Hx of adenomatous colonic polyps 02/28/2008   HYPERLIPIDEMIA 02/28/2008   OBESITY, MORBID 02/28/2008   DEPRESSION/ANXIETY 02/28/2008   Essential hypertension 02/28/2008   External hemorrhoids 02/28/2008   GASTROESOPHAGEAL REFLUX DISEASE, CHRONIC 02/28/2008   IBS 02/28/2008   Fatty liver 02/28/2008   Backache 02/28/2008   PEDAL EDEMA 02/28/2008   VOMITING 02/28/2008   ABDOMINAL BLOATING 02/28/2008   Abdominal pain 02/28/2008   RECTAL BLEEDING, HX OF 02/28/2008   HEADACHES, HX OF 02/28/2008   TOBACCO USE, QUIT 02/28/2008    Allergies:  Allergies  Allergen Reactions  Gabapentin     REACTION: confusion   Nitrofurantoin Hives   Medications:  Current Outpatient Medications:    ondansetron  (ZOFRAN ) 4 MG tablet, Take 1 tablet (4 mg total) by mouth every 8 (eight) hours as needed for nausea or vomiting., Disp: 20 tablet, Rfl: 0   acetaminophen  (TYLENOL ) 650 MG CR tablet, Take 1,300 mg by mouth every 8 (eight) hours as needed for  pain., Disp: , Rfl:    atenolol  (TENORMIN ) 50 MG tablet, Take 50 mg by mouth at bedtime., Disp: , Rfl:    atorvastatin (LIPITOR) 40 MG tablet, Take 40 mg by mouth at bedtime., Disp: , Rfl:    benazepril (LOTENSIN) 20 MG tablet, Take 40 mg by mouth at bedtime., Disp: , Rfl:    diclofenac sodium (VOLTAREN) 1 % GEL, Apply 1 application topically 2 (two) times daily as needed (knee pain)., Disp: , Rfl: 5   methocarbamol  (ROBAXIN ) 500 MG tablet, Take 500 mg by mouth every 6 (six) hours as needed for muscle spasms., Disp: , Rfl:    mirtazapine  (REMERON ) 15 MG tablet, Take 15 mg by mouth at bedtime., Disp: , Rfl:    polyethylene glycol powder (GLYCOLAX /MIRALAX ) powder, MIX 1 CAPFUL IN LIQUID AT BEDTIME AS NEEDED FOR CONSTIPATION. (Patient taking differently: Take 17 g by mouth at bedtime.), Disp: 527 g, Rfl: 3   Semaglutide,0.25 or 0.5MG /DOS, (OZEMPIC, 0.25 OR 0.5 MG/DOSE,) 2 MG/1.5ML SOPN, Inject into the skin., Disp: , Rfl:    sodium-potassium bicarbonate (ALKA-SELTZER GOLD) TBEF dissolvable tablet, Take 2 tablets by mouth 2 (two) times daily as needed (heartburn)., Disp: , Rfl:   Observations/Objective: Patient is well-developed, well-nourished in no acute distress.  Resting comfortably  at home.  Head is normocephalic, atraumatic.  No labored breathing.  Speech is clear and coherent with logical content.  Patient is alert and oriented at baseline.    Assessment and Plan: 1. Viral gastroenteritis (Primary)  Increase fluids, no milk or dairy, UC if sx worsen or hydration become a problem. Use caution with only limited amt of imodium due to gastric empty delay with wegovy.  Follow Up Instructions: I discussed the assessment and treatment plan with the patient. The patient was provided an opportunity to ask questions and all were answered. The patient agreed with the plan and demonstrated an understanding of the instructions.  A copy of instructions were sent to the patient via MyChart unless  otherwise noted below.     The patient was advised to call back or seek an in-person evaluation if the symptoms worsen or if the condition fails to improve as anticipated.    Kingsten Enfield, FNP

## 2023-11-08 DIAGNOSIS — Z79899 Other long term (current) drug therapy: Secondary | ICD-10-CM | POA: Diagnosis not present

## 2023-11-08 DIAGNOSIS — E118 Type 2 diabetes mellitus with unspecified complications: Secondary | ICD-10-CM | POA: Diagnosis not present

## 2023-11-08 DIAGNOSIS — I10 Essential (primary) hypertension: Secondary | ICD-10-CM | POA: Diagnosis not present

## 2023-11-10 ENCOUNTER — Encounter: Payer: Self-pay | Admitting: Obstetrics & Gynecology

## 2023-11-10 ENCOUNTER — Other Ambulatory Visit (HOSPITAL_COMMUNITY)
Admission: RE | Admit: 2023-11-10 | Discharge: 2023-11-10 | Disposition: A | Discharge status: Home / Self Care | Source: Ambulatory Visit | Attending: Obstetrics & Gynecology | Admitting: Obstetrics & Gynecology

## 2023-11-10 ENCOUNTER — Ambulatory Visit: Admitting: Obstetrics & Gynecology

## 2023-11-10 VITALS — BP 142/83 | HR 92 | Ht 65.0 in | Wt 271.2 lb

## 2023-11-10 DIAGNOSIS — Z1151 Encounter for screening for human papillomavirus (HPV): Secondary | ICD-10-CM | POA: Insufficient documentation

## 2023-11-10 DIAGNOSIS — Z0000 Encounter for general adult medical examination without abnormal findings: Secondary | ICD-10-CM | POA: Diagnosis not present

## 2023-11-10 DIAGNOSIS — Z8542 Personal history of malignant neoplasm of other parts of uterus: Secondary | ICD-10-CM

## 2023-11-10 DIAGNOSIS — Z90710 Acquired absence of both cervix and uterus: Secondary | ICD-10-CM | POA: Diagnosis not present

## 2023-11-10 NOTE — Progress Notes (Signed)
 Subjective:     Surveillance for endometrial cancer:    Patty Estrada is a 73 y.o. female here for a routine exam.  No LMP recorded. Patient has had a hysterectomy. [id number] Birth Control Method:  hysterectomy Menstrual Calendar(currently): na  Current complaints: weight/legs.   Current acute medical issues:  Gr 1 Stage 1 endometrial adenocarcinoma   Recent Gynecologic History No LMP recorded. Patient has had a hysterectomy. 10/2021 Last Pap:na ,     Past Medical History:  Diagnosis Date   Adenomatous colon polyp 2006       Arthritis    Atrial fibrillation (HCC) 2013   Depression    Dysrhythmia 2013   a-fib   Endometrial cancer (HCC)    Fatty liver    ?cirrhosis on u/s in 2008   GERD (gastroesophageal reflux disease)    Headache    sinus   HTN (hypertension)    Hyperglycemia    Hyperlipidemia    NASH (nonalcoholic steatohepatitis)    Obesity    Peripheral vascular disease (HCC) 2003   Wt was 408 lbs   Pre-diabetes    Urinary incontinence    Vitamin D deficiency     Past Surgical History:  Procedure Laterality Date   COLONOSCOPY  04/09/2005   adenomatous polyps, two diverticula   COLONOSCOPY  12/2010   RMR: left-sided diverticula, adenomatous polyps, next TCS due 12/2015   DILATION AND CURETTAGE OF UTERUS  2015   DILATION AND CURETTAGE OF UTERUS N/A 12/19/2020   Procedure: POSSIBLE DILATATION AND CURETTAGE;  Surgeon: Suzi Essex, MD;  Location: [facility];  Service: Gynecology;  Laterality: N/A;   ESOPHAGOGASTRODUODENOSCOPY  2006   hyperplastic polyps, hh   ESOPHAGOGASTRODUODENOSCOPY  12/2010   RMR:hh, multiple benign gastric polyps,    HYSTEROSCOPY  02/27/2014   Procedure: HYSTEROSCOPY;  Surgeon: Albino Hum, MD;  Location: [facility];  Service: Gynecology;;   INTRAUTERINE DEVICE (IUD) INSERTION N/A 12/19/2020   Procedure: POSSIBLE INTRAUTERINE DEVICE (IUD) INSERTION;  Surgeon: Suzi Essex, MD;  Location: [facility];  Service: Gynecology;   Laterality: N/A;   LAPAROSCOPY N/A 12/19/2020   Procedure: Diagnostic Laparoscopy;  Surgeon: Suzi Essex, MD;  Location: [facility];  Service: Gynecology;  Laterality: N/A;   POLYPECTOMY N/A 02/27/2014   Procedure: REMOVAL OF ENDOCERVICAL POLYP AND ENDOMETRIAL POLYPS;  Surgeon: Albino Hum, MD;  Location: [facility];  Service: Gynecology;  Laterality: N/A;   ROBOTIC ASSISTED TOTAL HYSTERECTOMY WITH BILATERAL SALPINGO OOPHERECTOMY Bilateral 10/21/2021   Procedure: XI ROBOTIC ASSISTED TOTAL HYSTERECTOMY WITH BILATERAL SALPINGO OOPHORECTOMY, MINI-LAPAROTOMY;  Surgeon: Suzi Essex, MD;  Location: [facility];  Service: Gynecology;  Laterality: Bilateral;   SENTINEL NODE BIOPSY N/A 10/21/2021   Procedure: SENTINEL NODE BIOPSY;  Surgeon: Suzi Essex, MD;  Location: [facility];  Service: Gynecology;  Laterality: N/A;   TUBAL LIGATION     WISDOM TOOTH EXTRACTION      OB History     Gravida  2   Para  2   Term  2   Preterm  0   AB  0   Living  2      SAB  0   IAB  0   Ectopic  0   Multiple  0   Live Births  2           Social History   Socioeconomic History   Marital status: Married    Spouse name: Not on file   Number of children: 2   Years  of education: Not on file   Highest education level: Not on file  Occupational History   Occupation: retired  Tobacco Use   Smoking status: Former    Current packs/day: 0.00    Average packs/day: 2.0 packs/day for 13.0 years (26.0 ttl pk-yrs)    Types: Cigarettes    Start date: 07/07/1971    Quit date: 07/06/1984    Years since quitting: 39.3   Smokeless tobacco: Never   Tobacco comments:    Quit since 1986  Vaping Use   Vaping status: Never Used  Substance and Sexual Activity   Alcohol use: Yes    Alcohol/week: 0.0 standard drinks of alcohol    Comment: maybe one or two drinks a year   Drug use: No   Sexual activity: Not Currently    Birth control/protection: Surgical, Post-menopausal  Other Topics Concern    Not on file  Social History Narrative   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   Social Drivers of Corporate investment banker Strain: Low Risk  (10/28/2020)   Overall Financial Resource Strain (CARDIA)    Difficulty of Paying Living Expenses: Not very hard  Food Insecurity: No Food Insecurity (10/28/2020)   Hunger Vital Sign    Worried About Running Out of Food in the Last Year: Never true    Ran Out of Food in the Last Year: Never true  Transportation Needs: No Transportation Needs (10/28/2020)   PRAPARE - Administrator, Civil Service (Medical): No    Lack of Transportation (Non-Medical): No  Physical Activity: Inactive (10/28/2020)   Exercise Vital Sign    Days of Exercise per Week: 0 days    Minutes of Exercise per Session: 0 min  Stress: No Stress Concern Present (10/28/2020)   [employer] of Occupational Health - Occupational Stress Questionnaire    Feeling of Stress : Only a little  Social Connections: Moderately Isolated (10/28/2020)   Social Connection and Isolation Panel [NHANES]    Frequency of Communication with Friends and Family: More than three times a week    Frequency of Social Gatherings with Friends and Family: Patient declined    Attends Religious Services: Never    Database administrator or Organizations: No    Attends Engineer, structural: Never    Marital Status: Married    Family History  Problem Relation Age of Onset   Breast cancer Mother 40   Heart attack Father 32   Heart disease Brother    Heart attack Brother    Colon cancer Neg Hx    Liver disease Neg Hx    Uterine cancer Neg Hx    Ovarian cancer Neg Hx      Current Outpatient Medications:    acetaminophen  (TYLENOL ) 650 MG CR tablet, Take 1,300 mg by mouth every 8 (eight) hours as needed for pain., Disp: , Rfl:    atenolol  (TENORMIN ) 50 MG tablet, Take 50 mg by mouth at bedtime., Disp: , Rfl:    atorvastatin (LIPITOR) 40 MG tablet, Take 40 mg by mouth at bedtime.,  Disp: , Rfl:    benazepril (LOTENSIN) 20 MG tablet, Take 40 mg by mouth at bedtime., Disp: , Rfl:    diclofenac sodium (VOLTAREN) 1 % GEL, Apply 1 application topically 2 (two) times daily as needed (knee pain)., Disp: , Rfl: 5   methocarbamol  (ROBAXIN ) 500 MG tablet, Take 500 mg by mouth every 6 (six) hours as needed for muscle spasms., Disp: , Rfl:  mirtazapine  (REMERON ) 15 MG tablet, Take 15 mg by mouth at bedtime., Disp: , Rfl:    Semaglutide-Weight Management (WEGOVY) 2.4 MG/0.75ML SOAJ, Inject 2.4 mg into the skin., Disp: , Rfl:    sodium-potassium bicarbonate (ALKA-SELTZER GOLD) TBEF dissolvable tablet, Take 2 tablets by mouth 2 (two) times daily as needed (heartburn)., Disp: , Rfl:    ondansetron  (ZOFRAN ) 4 MG tablet, Take 1 tablet (4 mg total) by mouth every 8 (eight) hours as needed for nausea or vomiting., Disp: 20 tablet, Rfl: 0   polyethylene glycol powder (GLYCOLAX /MIRALAX ) powder, MIX 1 CAPFUL IN LIQUID AT BEDTIME AS NEEDED FOR CONSTIPATION. (Patient not taking: Reported on 11/10/2023), Disp: 527 g, Rfl: 3   Semaglutide,0.25 or 0.5MG /DOS, (OZEMPIC, 0.25 OR 0.5 MG/DOSE,) 2 MG/1.5ML SOPN, Inject into the skin., Disp: , Rfl:   Review of Systems  Review of Systems  Constitutional: Negative for fever, chills, weight loss, malaise/fatigue and diaphoresis.  HENT: Negative for hearing loss, ear pain, nosebleeds, congestion, sore throat, neck pain, tinnitus and ear discharge.   Eyes: Negative for blurred vision, double vision, photophobia, pain, discharge and redness.  Respiratory: Negative for cough, hemoptysis, sputum production, shortness of breath, wheezing and stridor.   Cardiovascular: Negative for chest pain, palpitations, orthopnea, claudication, leg swelling and PND.  Gastrointestinal: negative for abdominal pain. Negative for heartburn, nausea, vomiting, diarrhea, constipation, blood in stool and melena.  Genitourinary: Negative for dysuria, urgency, frequency, hematuria and flank  pain.  Musculoskeletal: Negative for myalgias, back pain, joint pain and falls.  Skin: Negative for itching and rash.  Neurological: Negative for dizziness, tingling, tremors, sensory change, speech change, focal weakness, seizures, loss of consciousness, weakness and headaches.  Endo/Heme/Allergies: Negative for environmental allergies and polydipsia. Does not bruise/bleed easily.  Psychiatric/Behavioral: Negative for depression, suicidal ideas, hallucinations, memory loss and substance abuse. The patient is not nervous/anxious and does not have insomnia.        Objective:  Blood pressure (!) 142/83, pulse 92, height 5\' 5"  (1.651 m), weight 271 lb 3.2 oz (123 kg).   Physical Exam  Vitals reviewed. Constitutional: She is oriented to person, place, and time. She appears well-developed and well-nourished.  HENT:  Head: Normocephalic and atraumatic.          GI: Soft. Bowel sounds are normal. She exhibits no distension and no mass. There is no tenderness. There is no rebound and no guarding.  Genitourinary:  Breasts no masses skin changes or nipple changes bilaterally      Vulva is normal without lesions Vagina is pink moist without discharge, cuff intact no lesions, cytology obtained Cervix absent and pap is not done Uterus is normal size shape and contour Adnexa is negative   Musculoskeletal: Normal range of motion. She exhibits no edema and no tenderness.  Neurological: She is alert and oriented to person, place, and time. She has normal reflexes. She displays normal reflexes. No cranial nerve deficit. She exhibits normal muscle tone. Coordination normal.  Skin: Skin is warm and dry. No rash noted. No erythema. No pallor.  Psychiatric: She has a normal mood and affect. Her behavior is normal. Judgment and thought content normal.       Medications Ordered at today's visit: No orders of the defined types were placed in this encounter.   Other orders placed at today's visit: No  orders of the defined types were placed in this encounter.     Assessment:    Stage 1A Grade 1 endometrial adenocarcinoma 10/2020--> S/P definitive surgical management 10/2021  Plan:    NED on exam Follow up here 1 year(DR Orvil Bland 6 months)     Return in about 1 year (around 11/09/2024) for Follow up, with Dr Randolm Butte exam.

## 2023-11-15 ENCOUNTER — Encounter: Payer: Self-pay | Admitting: Obstetrics & Gynecology

## 2023-11-15 DIAGNOSIS — E785 Hyperlipidemia, unspecified: Secondary | ICD-10-CM | POA: Diagnosis not present

## 2023-11-15 DIAGNOSIS — E1169 Type 2 diabetes mellitus with other specified complication: Secondary | ICD-10-CM | POA: Diagnosis not present

## 2023-11-15 DIAGNOSIS — I10 Essential (primary) hypertension: Secondary | ICD-10-CM | POA: Diagnosis not present

## 2023-11-15 LAB — CYTOLOGY - PAP
Comment: NEGATIVE
Diagnosis: NEGATIVE
High risk HPV: NEGATIVE

## 2024-02-08 DIAGNOSIS — E1169 Type 2 diabetes mellitus with other specified complication: Secondary | ICD-10-CM | POA: Diagnosis not present

## 2024-02-08 DIAGNOSIS — Z79899 Other long term (current) drug therapy: Secondary | ICD-10-CM | POA: Diagnosis not present

## 2024-02-08 DIAGNOSIS — E210 Primary hyperparathyroidism: Secondary | ICD-10-CM | POA: Diagnosis not present

## 2024-02-23 DIAGNOSIS — E210 Primary hyperparathyroidism: Secondary | ICD-10-CM | POA: Diagnosis not present

## 2024-02-23 DIAGNOSIS — E1169 Type 2 diabetes mellitus with other specified complication: Secondary | ICD-10-CM | POA: Diagnosis not present

## 2024-05-24 DIAGNOSIS — E1169 Type 2 diabetes mellitus with other specified complication: Secondary | ICD-10-CM | POA: Diagnosis not present

## 2024-05-24 DIAGNOSIS — Z79899 Other long term (current) drug therapy: Secondary | ICD-10-CM | POA: Diagnosis not present

## 2024-05-24 DIAGNOSIS — E210 Primary hyperparathyroidism: Secondary | ICD-10-CM | POA: Diagnosis not present

## 2024-05-25 ENCOUNTER — Encounter: Payer: Self-pay | Admitting: Gynecologic Oncology

## 2024-05-30 ENCOUNTER — Inpatient Hospital Stay: Attending: Gynecologic Oncology | Admitting: Gynecologic Oncology

## 2024-05-30 VITALS — BP 140/66 | HR 86 | Temp 98.8°F | Resp 18 | Ht 65.0 in | Wt 265.0 lb

## 2024-05-30 DIAGNOSIS — N8003 Adenomyosis of the uterus: Secondary | ICD-10-CM | POA: Insufficient documentation

## 2024-05-30 DIAGNOSIS — Z90710 Acquired absence of both cervix and uterus: Secondary | ICD-10-CM | POA: Insufficient documentation

## 2024-05-30 DIAGNOSIS — Z860101 Personal history of adenomatous and serrated colon polyps: Secondary | ICD-10-CM | POA: Diagnosis not present

## 2024-05-30 DIAGNOSIS — K5900 Constipation, unspecified: Secondary | ICD-10-CM | POA: Diagnosis not present

## 2024-05-30 DIAGNOSIS — R197 Diarrhea, unspecified: Secondary | ICD-10-CM | POA: Insufficient documentation

## 2024-05-30 DIAGNOSIS — Z87891 Personal history of nicotine dependence: Secondary | ICD-10-CM | POA: Diagnosis not present

## 2024-05-30 DIAGNOSIS — C541 Malignant neoplasm of endometrium: Secondary | ICD-10-CM | POA: Diagnosis not present

## 2024-05-30 DIAGNOSIS — R110 Nausea: Secondary | ICD-10-CM | POA: Insufficient documentation

## 2024-05-30 DIAGNOSIS — Z8249 Family history of ischemic heart disease and other diseases of the circulatory system: Secondary | ICD-10-CM | POA: Insufficient documentation

## 2024-05-30 DIAGNOSIS — Z79899 Other long term (current) drug therapy: Secondary | ICD-10-CM | POA: Insufficient documentation

## 2024-05-30 DIAGNOSIS — Z803 Family history of malignant neoplasm of breast: Secondary | ICD-10-CM | POA: Insufficient documentation

## 2024-05-30 NOTE — Patient Instructions (Addendum)
 It was good to see you today.  I do not see or feel any evidence of cancer recurrence on your exam. Happy Holidays to you!   Plan to follow up with Dr. Jayne with GYN in six months and Dr. Viktoria in 1 year.  Please call our office after you see Dr. Jayne to schedule your appointment with Dr. Viktoria.   As always, if you develop any new and concerning symptoms before your next visit, please call to see me sooner.  Symptoms to report to your health care team include vaginal bleeding, rectal bleeding, bloating, weight loss without effort, new and persistent pain, new and  persistent fatigue, new leg swelling, new masses (i.e., bumps in your neck or groin), new and persistent cough, new and persistent nausea and vomiting, change in bowel or bladder habits, and any other concerns.

## 2024-05-30 NOTE — Progress Notes (Signed)
 Gynecologic Oncology Return Clinic Visit  05/30/24  Reason for Visit: Surveillance in the setting of endometrial cancer   Treatment History: Oncology History  Endometrial adenocarcinoma (HCC)  10/28/2020 Initial Biopsy   EMB: Grade 1 endometrioid adenoca   10/28/2020 Initial Diagnosis   Endometrial adenocarcinoma (HCC)   12/19/2020 Surgery   D&C: endometrioid adenoca, grade 1, with associated CAH  IHC MMR intact   07/02/2021 Pathology Results   EMB A. ENDOMETRIAL, BIOPSY:  - Endometrioid adenocarcinoma, FIGO grade 1, associated with complex  atypical hyperplasia.   10/02/2021 Pathology Results   EMB A. ENDOMETRIUM, BIOPSY:  Adenocarcinoma of the endometrium, endometrioid type, FIGO grade 1.  Progesterone induced changes in the endometrial stroma.  Please see comment.    10/21/2021 Surgery   TRH/BSO, SLN bx, oversew bladder serosa, mini-lap  Findings: On EUA, 12 cm mobile uterus. On intra-abdominal entry, normal upper abdominal survey. Normal omentum, small and large bowel. Enlarged 12 cm bulbous uterus. Normal appearing bilateral adnexa. Narrow pelvis with significant adiposity. Mapping successful to bilateral external iliac sentinel lymph nodes (both overlaying the external iliac vein). No gross adenopathy. No intra-abdominal or pelvic evidence of disease. Uterine specimen too large for transvaginal removal, requiring mini-lap for specimen delivery.   10/21/2021 Pathology Results   A.   SENTINEL LYMPH NODE, RIGHT EXTERNAL ILIAC, BIOPSY:  -    Lymph node negative for malignancy (0/1).   B.   SENTINEL LYMPH NODE, LEFT EXTERNAL ILIAC, BIOPSY:  -    Lymph node negative for malignancy (0/1).   C.   UTERUS, CERVIX, BILATERAL TUBES AND OVARIES:  -    Endometrial endometrioid carcinoma, FIGO grade 1, in a background  of endometrial intraepithelial neoplasia (EIN)/atypical endometrial  hyperplasia, with MELF (microcystic, elongated and fragmented) pattern  of myoinvasion limited  to inner half of myometrium, and with extensive  involvement of adenomyosis.   -    Cervix:             Negative for dysplasia.  -    Endometrium         Intrauterine device.  -    Myometrium:         Adenomyosis.  Two leiomyomas, largest 2.5 cm in greatest dimension.  -    Ovaries:       Senescent ovaries, negative for malignancy.  -    Fallopian tubes:    Negative for malignancy.   ONCOLOGY TABLE:   UTERUS, CARCINOMA OR CARCINOSARCOMA: Resection   Procedure: Total hysterectomy and bilateral salpingo-oophorectomy  Histologic Type: Endometrioid  Histologic Grade: FIGO Grade 1  Myometrial Invasion:       Depth of Myometrial Invasion (mm): 15 mm (MELF) pattern       Myometrial Thickness (mm): 45 mm       Percentage of Myometrial Invasion: 33%  Uterine Serosa Involvement: Not identified  Cervical stromal Involvement: Not identified  Extent of involvement of other tissue/organs: Not identified  Peritoneal/Ascitic Fluid: Not submitted/unknown  Lymphovascular Invasion: Not identified  Regional Lymph Nodes: Regional lymph nodes present, all lymph nodes  negative for tumor cells       Pelvic Lymph Nodes Examined:      2 Sentinel                                0 Non-sentinel  2 Total       Pelvic Lymph Nodes with Metastasis: 0       Para-aortic Lymph Nodes Examined:  0  Distant Metastasis:       Distant Site(s) Involved: Not applicable    11/18/2021 Imaging   Pelvic ultrasound  Uterus                      12.2 x 9.3 x 9.7 cm, Total uterine volume 572 cc,enlarged homogeneous anteverted uterus,wnl   Endometrium          42 mm, symmetrical, homogeneous thickened endometrium with color flow   Right ovary             2.4 x 2.9 x 2.3 cm, wnl,limited view    Left ovary                3.2 x 2.4 x 2.6 cm, wnl,limited view    No free fluid     Interval History: The patient presents today to the office for continued surveillance.  She saw Dr. Jayne 6  months ago with a normal exam at that time.  She reports overall doing well since her last visit.  She is currently on Wegovy and feels like her weight loss has plateaued at 25 pounds.  She sees her provider tomorrow and will see if the dose will be adjusted.  She does report a decreased appetite related to [facility] use and lack of interest in food.  She does have nausea with the medication and intermittent constipation and diarrhea.  Bladder is functioning at baseline with no dysuria, hematuria.  No vaginal bleeding or discharge reported.  She does have an anal fissure and had some spotting from this last week that has resolved.  No lower extremity edema reported.  She feels like she strained a muscle in her right leg and she does have a hx of sciatica.  She reports being overdue for her mammogram and will let our office know if she needs a referral sent.  No abdominal or pelvic pain reported.  Denies chest pain, dyspnea, new cough.  No symptoms concerning for recurrence voiced.  Past Medical/Surgical History: Past Medical History:  Diagnosis Date   Adenomatous colon polyp 2006       Arthritis    Atrial fibrillation (HCC) 2013   Depression    Dysrhythmia 2013   a-fib   Endometrial cancer (HCC)    Fatty liver    ?cirrhosis on u/s in 2008   GERD (gastroesophageal reflux disease)    Headache    sinus   HTN (hypertension)    Hyperglycemia    Hyperlipidemia    NASH (nonalcoholic steatohepatitis)    Obesity    Peripheral vascular disease 2003   Wt was 408 lbs   Pre-diabetes    Urinary incontinence    Vitamin D deficiency     Past Surgical History:  Procedure Laterality Date   COLONOSCOPY  04/09/2005   adenomatous polyps, two diverticula   COLONOSCOPY  12/2010   RMR: left-sided diverticula, adenomatous polyps, next TCS due 12/2015   DILATION AND CURETTAGE OF UTERUS  2015   DILATION AND CURETTAGE OF UTERUS N/A 12/19/2020   Procedure: POSSIBLE DILATATION AND CURETTAGE;  Surgeon: Viktoria Comer SAUNDERS, MD;  Location: [facility];  Service: Gynecology;  Laterality: N/A;   ESOPHAGOGASTRODUODENOSCOPY  2006   hyperplastic polyps, hh   ESOPHAGOGASTRODUODENOSCOPY  12/2010   RMR:hh, multiple benign gastric polyps,    HYSTEROSCOPY  02/27/2014   Procedure: HYSTEROSCOPY;  Surgeon: Norleen Edsel GAILS, MD;  Location: [facility];  Service: Gynecology;;   INTRAUTERINE DEVICE (IUD) INSERTION N/A 12/19/2020   Procedure: POSSIBLE INTRAUTERINE DEVICE (IUD) INSERTION;  Surgeon: Viktoria Comer SAUNDERS, MD;  Location: [facility];  Service: Gynecology;  Laterality: N/A;   LAPAROSCOPY N/A 12/19/2020   Procedure: Diagnostic Laparoscopy;  Surgeon: Viktoria Comer SAUNDERS, MD;  Location: [facility];  Service: Gynecology;  Laterality: N/A;   POLYPECTOMY N/A 02/27/2014   Procedure: REMOVAL OF ENDOCERVICAL POLYP AND ENDOMETRIAL POLYPS;  Surgeon: Norleen Edsel GAILS, MD;  Location: [facility];  Service: Gynecology;  Laterality: N/A;   ROBOTIC ASSISTED TOTAL HYSTERECTOMY WITH BILATERAL SALPINGO OOPHERECTOMY Bilateral 10/21/2021   Procedure: XI ROBOTIC ASSISTED TOTAL HYSTERECTOMY WITH BILATERAL SALPINGO OOPHORECTOMY, MINI-LAPAROTOMY;  Surgeon: Viktoria Comer SAUNDERS, MD;  Location: [facility];  Service: Gynecology;  Laterality: Bilateral;   SENTINEL NODE BIOPSY N/A 10/21/2021   Procedure: SENTINEL NODE BIOPSY;  Surgeon: Viktoria Comer SAUNDERS, MD;  Location: [facility];  Service: Gynecology;  Laterality: N/A;   TUBAL LIGATION     WISDOM TOOTH EXTRACTION      Family History  Problem Relation Age of Onset   Breast cancer Mother 49   Heart attack Father 57   Heart disease Brother    Heart attack Brother    Colon cancer Neg Hx    Liver disease Neg Hx    Uterine cancer Neg Hx    Ovarian cancer Neg Hx     Social History   Socioeconomic History   Marital status: Married    Spouse name: Not on file   Number of children: 2   Years of education: Not on file   Highest education level: Not on file  Occupational History   Occupation: retired  Tobacco  Use   Smoking status: Former    Current packs/day: 0.00    Average packs/day: 2.0 packs/day for 13.0 years (26.0 ttl pk-yrs)    Types: Cigarettes    Start date: 07/07/1971    Quit date: 07/06/1984    Years since quitting: 39.9   Smokeless tobacco: Never   Tobacco comments:    Quit since 1986  Vaping Use   Vaping status: Never Used  Substance and Sexual Activity   Alcohol use: Yes    Alcohol/week: 0.0 standard drinks of alcohol    Comment: maybe one or two drinks a year   Drug use: No   Sexual activity: Not Currently    Birth control/protection: Surgical, Post-menopausal  Other Topics Concern   Not on file  Social History Narrative   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   Social Drivers of Corporate Investment Banker Strain: Low Risk  (10/28/2020)   Overall Financial Resource Strain (CARDIA)    Difficulty of Paying Living Expenses: Not very hard  Food Insecurity: No Food Insecurity (10/28/2020)   Hunger Vital Sign    Worried About Running Out of Food in the Last Year: Never true    Ran Out of Food in the Last Year: Never true  Transportation Needs: No Transportation Needs (10/28/2020)   PRAPARE - Administrator, Civil Service (Medical): No    Lack of Transportation (Non-Medical): No  Physical Activity: Inactive (10/28/2020)   Exercise Vital Sign    Days of Exercise per Week: 0 days    Minutes of Exercise per Session: 0 min  Stress: No Stress Concern Present (10/28/2020)   [employer] of Occupational Health - Occupational Stress Questionnaire  Feeling of Stress : Only a little  Social Connections: Moderately Isolated (10/28/2020)   Social Connection and Isolation Panel    Frequency of Communication with Friends and Family: More than three times a week    Frequency of Social Gatherings with Friends and Family: Patient declined    Attends Religious Services: Never    Database Administrator or Organizations: No    Attends Engineer, Structural: Never     Marital Status: Married    Current Medications:  Current Outpatient Medications:    acetaminophen  (TYLENOL ) 650 MG CR tablet, Take 1,300 mg by mouth every 8 (eight) hours as needed for pain., Disp: , Rfl:    atenolol  (TENORMIN ) 50 MG tablet, Take 50 mg by mouth at bedtime., Disp: , Rfl:    atorvastatin (LIPITOR) 40 MG tablet, Take 40 mg by mouth at bedtime., Disp: , Rfl:    benazepril (LOTENSIN) 20 MG tablet, Take 40 mg by mouth at bedtime., Disp: , Rfl:    diclofenac sodium (VOLTAREN) 1 % GEL, Apply 1 application topically 2 (two) times daily as needed (knee pain)., Disp: , Rfl: 5   methocarbamol  (ROBAXIN ) 500 MG tablet, Take 500 mg by mouth every 6 (six) hours as needed for muscle spasms., Disp: , Rfl:    mirtazapine  (REMERON ) 15 MG tablet, Take 15 mg by mouth at bedtime., Disp: , Rfl:    polyethylene glycol powder (GLYCOLAX /MIRALAX ) powder, MIX 1 CAPFUL IN LIQUID AT BEDTIME AS NEEDED FOR CONSTIPATION., Disp: 527 g, Rfl: 3   Semaglutide-Weight Management (WEGOVY) 2.4 MG/0.75ML SOAJ, Inject 2.4 mg into the skin. (Patient taking differently: Inject 2.4 mg into the skin once a week.), Disp: , Rfl:    sodium-potassium bicarbonate (ALKA-SELTZER GOLD) TBEF dissolvable tablet, Take 2 tablets by mouth 2 (two) times daily as needed (heartburn)., Disp: , Rfl:   Review of Systems: + Appetite changes related to [facility] use. See interval. No new symptoms reported.  Denies fevers, chills, fatigue, unexplained weight changes. Denies hearing loss, neck lumps or masses, mouth sores, ringing in ears or voice changes. Denies cough or wheezing.  Denies shortness of breath. Denies chest pain or palpitations. Denies leg swelling. Denies abdominal distention, pain, blood in stools, vomiting. Denies pain with intercourse, dysuria, frequency, hematuria or incontinence. Denies hot flashes, pelvic pain, vaginal bleeding or vaginal discharge.   Denies back pain or muscle pain/cramps. Denies itching, rash, or  wounds. Denies dizziness, headaches, numbness or seizures. Denies swollen lymph nodes or glands, denies easy bruising or bleeding. Denies anxiety, depression, confusion, or decreased concentration.  Physical Exam: BP (!) 140/66 Comment: manual recheck, NP notified  Pulse 86   Temp 98.8 F (37.1 C) (Oral)   Resp 18   Ht 5' 5 (1.651 m)   Wt 265 lb (120.2 kg)   BMI 44.10 kg/m  General: Alert, oriented, no acute distress. HEENT: Normocephalic, atraumatic, sclera anicteric. Chest: Clear to auscultation bilaterally.  No wheezes or rhonchi. Cardiovascular: Regular rate and rhythm, no murmurs. Abdomen: Obese, soft, nontender.  Normoactive bowel sounds.  No masses or hepatosplenomegaly appreciated.  Well-healed incisions. Extremities: Grossly normal range of motion.  Warm, well perfused.  No edema bilaterally. Skin: No rashes or lesions noted. Lymphatics: No cervical, supraclavicular, or inguinal adenopathy. GU: Normal appearing external genitalia without erythema, excoriation, or lesions.  Speculum exam reveals mildly atrophic vaginal mucosa, no lesions noted.  No blood or discharge within the vaginal vault, cuff intact.  Bimanual exam reveals intact, no nodularity or masses palpated.  Rectovaginal  exam confirms findings.  Laboratory & Radiologic Studies: None new  Assessment & Plan: Patty Estrada is a 73 y.o. woman with Stage IA2 grade 1 endometrioid endometrial adenocarcinoma who presents for surveillance. Definitive surgery was in 10/2021. MMR intact from [facility] specimen in 12/2020. No adjuvant treatment given low risk disease.   Patient is overall doing well and is NED on exam today.   Per NCCN surveillance recommendations, we will continue with surveillance visits every 6 months alternating between our office and her OB/GYN (Dr. Jayne). She will plan to see Dr. Jayne in six months and Dr. Viktoria in one year. She is advised to call the office after her visit with Dr. Jayne to schedule  her appointment with Dr. Viktoria.    Discussed signs and symptoms that would be concerning for disease recurrence, and I stressed the importance of calling if she develops any of these. She will let our office know if she needs a referral to be sent in order to have a mammogram.  20 minutes of total time was spent for this patient encounter, including preparation, face-to-face counseling with the patient and coordination of care, and documentation of the encounter.  Eleanor Epps NP [facility] GYN Oncology

## 2024-05-31 DIAGNOSIS — Z23 Encounter for immunization: Secondary | ICD-10-CM | POA: Diagnosis not present

## 2024-05-31 DIAGNOSIS — E210 Primary hyperparathyroidism: Secondary | ICD-10-CM | POA: Diagnosis not present

## 2024-05-31 DIAGNOSIS — E1169 Type 2 diabetes mellitus with other specified complication: Secondary | ICD-10-CM | POA: Diagnosis not present

## 2024-05-31 DIAGNOSIS — I480 Paroxysmal atrial fibrillation: Secondary | ICD-10-CM | POA: Diagnosis not present

## 2024-06-08 ENCOUNTER — Other Ambulatory Visit (HOSPITAL_COMMUNITY): Payer: Self-pay | Admitting: Internal Medicine

## 2024-06-08 DIAGNOSIS — Z1231 Encounter for screening mammogram for malignant neoplasm of breast: Secondary | ICD-10-CM

## 2024-07-05 ENCOUNTER — Ambulatory Visit (HOSPITAL_COMMUNITY)
